# Patient Record
Sex: Female | Born: 1970 | Race: Black or African American | Hispanic: No | Marital: Married | Smoking: Never smoker
Health system: Southern US, Community
[De-identification: ages and names within clinical notes are randomized; demographics above are authoritative.]

## PROBLEM LIST (undated history)

## (undated) ENCOUNTER — Emergency Department (HOSPITAL_COMMUNITY): Payer: BC Managed Care – PPO

## (undated) DIAGNOSIS — I471 Supraventricular tachycardia, unspecified: Secondary | ICD-10-CM

## (undated) DIAGNOSIS — F4310 Post-traumatic stress disorder, unspecified: Secondary | ICD-10-CM

## (undated) DIAGNOSIS — D649 Anemia, unspecified: Secondary | ICD-10-CM

## (undated) DIAGNOSIS — E0590 Thyrotoxicosis, unspecified without thyrotoxic crisis or storm: Secondary | ICD-10-CM

## (undated) DIAGNOSIS — R9431 Abnormal electrocardiogram [ECG] [EKG]: Secondary | ICD-10-CM

## (undated) DIAGNOSIS — H209 Unspecified iridocyclitis: Secondary | ICD-10-CM

## (undated) DIAGNOSIS — E559 Vitamin D deficiency, unspecified: Secondary | ICD-10-CM

## (undated) DIAGNOSIS — D219 Benign neoplasm of connective and other soft tissue, unspecified: Secondary | ICD-10-CM

## (undated) DIAGNOSIS — R51 Headache: Secondary | ICD-10-CM

## (undated) DIAGNOSIS — I4719 Other supraventricular tachycardia: Secondary | ICD-10-CM

## (undated) DIAGNOSIS — E538 Deficiency of other specified B group vitamins: Secondary | ICD-10-CM

## (undated) DIAGNOSIS — D573 Sickle-cell trait: Secondary | ICD-10-CM

## (undated) DIAGNOSIS — R0602 Shortness of breath: Secondary | ICD-10-CM

## (undated) DIAGNOSIS — I10 Essential (primary) hypertension: Secondary | ICD-10-CM

## (undated) DIAGNOSIS — F419 Anxiety disorder, unspecified: Secondary | ICD-10-CM

## (undated) DIAGNOSIS — M7989 Other specified soft tissue disorders: Secondary | ICD-10-CM

## (undated) HISTORY — DX: Anemia, unspecified: D64.9

## (undated) HISTORY — DX: Deficiency of other specified B group vitamins: E53.8

## (undated) HISTORY — DX: Other specified soft tissue disorders: M79.89

## (undated) HISTORY — DX: Vitamin D deficiency, unspecified: E55.9

## (undated) HISTORY — DX: Benign neoplasm of connective and other soft tissue, unspecified: D21.9

## (undated) HISTORY — DX: Other supraventricular tachycardia: I47.19

## (undated) HISTORY — DX: Supraventricular tachycardia: I47.1

## (undated) HISTORY — DX: Abnormal electrocardiogram (ECG) (EKG): R94.31

## (undated) HISTORY — PX: OTHER SURGICAL HISTORY: SHX169

## (undated) HISTORY — PX: KNEE ARTHROSCOPY W/ INTERNAL FIXATION TIBIAL SPINE FRACTURE: SHX1871

---

## 1997-11-19 ENCOUNTER — Other Ambulatory Visit: Admission: RE | Admit: 1997-11-19 | Discharge: 1997-11-19 | Payer: Self-pay | Admitting: Obstetrics and Gynecology

## 1997-11-27 ENCOUNTER — Other Ambulatory Visit: Admission: RE | Admit: 1997-11-27 | Discharge: 1997-11-27 | Payer: Self-pay | Admitting: Obstetrics and Gynecology

## 1997-12-05 ENCOUNTER — Other Ambulatory Visit: Admission: RE | Admit: 1997-12-05 | Discharge: 1997-12-05 | Payer: Self-pay | Admitting: Obstetrics and Gynecology

## 1998-02-18 ENCOUNTER — Inpatient Hospital Stay (HOSPITAL_COMMUNITY): Admission: AD | Admit: 1998-02-18 | Discharge: 1998-02-18 | Payer: Self-pay | Admitting: Obstetrics and Gynecology

## 1998-05-24 ENCOUNTER — Inpatient Hospital Stay (HOSPITAL_COMMUNITY): Admission: AD | Admit: 1998-05-24 | Discharge: 1998-05-24 | Payer: Self-pay | Admitting: Obstetrics and Gynecology

## 1998-06-30 ENCOUNTER — Inpatient Hospital Stay (HOSPITAL_COMMUNITY): Admission: AD | Admit: 1998-06-30 | Discharge: 1998-06-30 | Payer: Self-pay | Admitting: Obstetrics and Gynecology

## 1998-07-01 ENCOUNTER — Inpatient Hospital Stay (HOSPITAL_COMMUNITY): Admission: AD | Admit: 1998-07-01 | Discharge: 1998-07-03 | Payer: Self-pay | Admitting: Obstetrics and Gynecology

## 1998-08-26 ENCOUNTER — Other Ambulatory Visit: Admission: RE | Admit: 1998-08-26 | Discharge: 1998-08-26 | Payer: Self-pay | Admitting: Obstetrics and Gynecology

## 1999-12-06 ENCOUNTER — Inpatient Hospital Stay (HOSPITAL_COMMUNITY): Admission: AD | Admit: 1999-12-06 | Discharge: 1999-12-06 | Payer: Self-pay | Admitting: *Deleted

## 1999-12-07 ENCOUNTER — Inpatient Hospital Stay (HOSPITAL_COMMUNITY): Admission: AD | Admit: 1999-12-07 | Discharge: 1999-12-09 | Payer: Self-pay | Admitting: *Deleted

## 2009-04-22 ENCOUNTER — Ambulatory Visit: Payer: Self-pay | Admitting: Diagnostic Radiology

## 2009-04-22 ENCOUNTER — Encounter: Payer: Self-pay | Admitting: Emergency Medicine

## 2009-04-23 ENCOUNTER — Inpatient Hospital Stay (HOSPITAL_COMMUNITY): Admission: EM | Admit: 2009-04-23 | Discharge: 2009-04-24 | Payer: Self-pay | Admitting: Emergency Medicine

## 2009-04-23 ENCOUNTER — Encounter (INDEPENDENT_AMBULATORY_CARE_PROVIDER_SITE_OTHER): Payer: Self-pay | Admitting: Internal Medicine

## 2009-04-24 ENCOUNTER — Encounter (INDEPENDENT_AMBULATORY_CARE_PROVIDER_SITE_OTHER): Payer: Self-pay | Admitting: Internal Medicine

## 2009-05-09 ENCOUNTER — Ambulatory Visit: Payer: Self-pay | Admitting: Cardiology

## 2009-05-09 ENCOUNTER — Observation Stay (HOSPITAL_COMMUNITY): Admission: EM | Admit: 2009-05-09 | Discharge: 2009-05-09 | Payer: Self-pay | Admitting: Emergency Medicine

## 2009-05-09 ENCOUNTER — Encounter: Payer: Self-pay | Admitting: Cardiology

## 2009-06-04 ENCOUNTER — Ambulatory Visit: Payer: Self-pay | Admitting: Internal Medicine

## 2009-06-04 ENCOUNTER — Inpatient Hospital Stay (HOSPITAL_COMMUNITY): Admission: EM | Admit: 2009-06-04 | Discharge: 2009-06-06 | Payer: Self-pay | Admitting: Emergency Medicine

## 2009-06-06 ENCOUNTER — Ambulatory Visit: Payer: Self-pay | Admitting: Surgery

## 2009-06-06 ENCOUNTER — Encounter (INDEPENDENT_AMBULATORY_CARE_PROVIDER_SITE_OTHER): Payer: Self-pay | Admitting: Family Medicine

## 2009-06-15 ENCOUNTER — Observation Stay (HOSPITAL_COMMUNITY): Admission: EM | Admit: 2009-06-15 | Discharge: 2009-06-16 | Payer: Self-pay | Admitting: Emergency Medicine

## 2009-06-19 ENCOUNTER — Emergency Department (HOSPITAL_BASED_OUTPATIENT_CLINIC_OR_DEPARTMENT_OTHER): Admission: EM | Admit: 2009-06-19 | Discharge: 2009-06-19 | Payer: Self-pay | Admitting: Emergency Medicine

## 2009-06-19 ENCOUNTER — Ambulatory Visit: Payer: Self-pay | Admitting: Diagnostic Radiology

## 2009-06-20 ENCOUNTER — Emergency Department (HOSPITAL_BASED_OUTPATIENT_CLINIC_OR_DEPARTMENT_OTHER): Admission: EM | Admit: 2009-06-20 | Discharge: 2009-06-21 | Payer: Self-pay | Admitting: Emergency Medicine

## 2009-09-17 ENCOUNTER — Ambulatory Visit: Payer: Self-pay | Admitting: Diagnostic Radiology

## 2009-09-17 ENCOUNTER — Emergency Department (HOSPITAL_BASED_OUTPATIENT_CLINIC_OR_DEPARTMENT_OTHER): Admission: EM | Admit: 2009-09-17 | Discharge: 2009-09-17 | Payer: Self-pay | Admitting: Emergency Medicine

## 2010-04-22 ENCOUNTER — Emergency Department (HOSPITAL_COMMUNITY): Admission: EM | Admit: 2010-04-22 | Discharge: 2010-04-22 | Payer: Self-pay | Admitting: Emergency Medicine

## 2010-04-25 ENCOUNTER — Inpatient Hospital Stay (HOSPITAL_COMMUNITY)
Admission: RE | Admit: 2010-04-25 | Discharge: 2010-04-28 | Payer: Self-pay | Source: Home / Self Care | Admitting: Orthopaedic Surgery

## 2010-05-02 ENCOUNTER — Ambulatory Visit
Admission: RE | Admit: 2010-05-02 | Discharge: 2010-05-02 | Payer: Self-pay | Source: Home / Self Care | Admitting: Orthopaedic Surgery

## 2010-05-02 ENCOUNTER — Ambulatory Visit: Payer: Self-pay | Admitting: Vascular Surgery

## 2010-05-02 ENCOUNTER — Encounter (INDEPENDENT_AMBULATORY_CARE_PROVIDER_SITE_OTHER): Payer: Self-pay | Admitting: Orthopaedic Surgery

## 2010-06-03 ENCOUNTER — Encounter
Admission: RE | Admit: 2010-06-03 | Discharge: 2010-08-07 | Payer: Self-pay | Source: Home / Self Care | Attending: Orthopaedic Surgery | Admitting: Orthopaedic Surgery

## 2010-08-18 ENCOUNTER — Encounter
Admission: RE | Admit: 2010-08-18 | Discharge: 2010-09-16 | Payer: Self-pay | Source: Home / Self Care | Attending: Orthopaedic Surgery | Admitting: Orthopaedic Surgery

## 2010-09-23 ENCOUNTER — Ambulatory Visit: Payer: PRIVATE HEALTH INSURANCE | Attending: Orthopaedic Surgery | Admitting: Physical Therapy

## 2010-09-23 DIAGNOSIS — M25569 Pain in unspecified knee: Secondary | ICD-10-CM | POA: Insufficient documentation

## 2010-09-23 DIAGNOSIS — R262 Difficulty in walking, not elsewhere classified: Secondary | ICD-10-CM | POA: Insufficient documentation

## 2010-09-23 DIAGNOSIS — M25669 Stiffness of unspecified knee, not elsewhere classified: Secondary | ICD-10-CM | POA: Insufficient documentation

## 2010-09-23 DIAGNOSIS — IMO0001 Reserved for inherently not codable concepts without codable children: Secondary | ICD-10-CM | POA: Insufficient documentation

## 2010-09-24 ENCOUNTER — Encounter: Payer: Self-pay | Admitting: Physical Therapy

## 2010-10-02 ENCOUNTER — Ambulatory Visit: Payer: PRIVATE HEALTH INSURANCE | Admitting: Physical Therapy

## 2010-10-09 ENCOUNTER — Ambulatory Visit: Payer: PRIVATE HEALTH INSURANCE | Admitting: Physical Therapy

## 2010-10-19 ENCOUNTER — Emergency Department (HOSPITAL_BASED_OUTPATIENT_CLINIC_OR_DEPARTMENT_OTHER)
Admission: EM | Admit: 2010-10-19 | Discharge: 2010-10-19 | Disposition: A | Discharge status: Home / Self Care | Payer: BC Managed Care – PPO | Attending: Emergency Medicine | Admitting: Emergency Medicine

## 2010-10-19 ENCOUNTER — Emergency Department (INDEPENDENT_AMBULATORY_CARE_PROVIDER_SITE_OTHER): Payer: BC Managed Care – PPO

## 2010-10-19 DIAGNOSIS — R42 Dizziness and giddiness: Secondary | ICD-10-CM

## 2010-10-19 DIAGNOSIS — F411 Generalized anxiety disorder: Secondary | ICD-10-CM | POA: Insufficient documentation

## 2010-10-19 DIAGNOSIS — I10 Essential (primary) hypertension: Secondary | ICD-10-CM | POA: Insufficient documentation

## 2010-10-19 DIAGNOSIS — R51 Headache: Secondary | ICD-10-CM | POA: Insufficient documentation

## 2010-10-19 DIAGNOSIS — R209 Unspecified disturbances of skin sensation: Secondary | ICD-10-CM | POA: Insufficient documentation

## 2010-10-19 LAB — CBC
HCT: 37.2 % (ref 36.0–46.0)
Hemoglobin: 13.1 g/dL (ref 12.0–15.0)
MCH: 23.9 pg — ABNORMAL LOW (ref 26.0–34.0)
RBC: 5.49 MIL/uL — ABNORMAL HIGH (ref 3.87–5.11)
RDW: 14.2 % (ref 11.5–15.5)
WBC: 3.8 10*3/uL — ABNORMAL LOW (ref 4.0–10.5)

## 2010-10-19 LAB — URINE MICROSCOPIC-ADD ON

## 2010-10-19 LAB — PROTIME-INR
INR: 1.01 (ref 0.00–1.49)
Prothrombin Time: 13.5 seconds (ref 11.6–15.2)

## 2010-10-19 LAB — BASIC METABOLIC PANEL
Calcium: 9.1 mg/dL (ref 8.4–10.5)
Chloride: 107 mEq/L (ref 96–112)
Creatinine, Ser: 0.7 mg/dL (ref 0.4–1.2)
GFR calc Af Amer: >60 mL/min (ref >60)
GFR calc non Af Amer: >60 mL/min (ref >60)
Glucose, Bld: 89 mg/dL (ref 70–99)

## 2010-10-19 LAB — DIFFERENTIAL
Lymphocytes Relative: 27 % (ref 12–46)
Monocytes Relative: 9 % (ref 3–12)
Neutrophils Relative %: 63 % (ref 43–77)

## 2010-10-19 LAB — URINALYSIS, ROUTINE W REFLEX MICROSCOPIC
Bilirubin Urine: NEGATIVE
Leukocytes, UA: NEGATIVE
Protein, ur: NEGATIVE mg/dL
Specific Gravity, Urine: 1.012 (ref 1.005–1.030)
pH: 6.5 (ref 5.0–8.0)

## 2010-10-29 ENCOUNTER — Other Ambulatory Visit: Payer: Self-pay | Admitting: Family Medicine

## 2010-10-29 DIAGNOSIS — Z1231 Encounter for screening mammogram for malignant neoplasm of breast: Secondary | ICD-10-CM

## 2010-10-30 LAB — COMPREHENSIVE METABOLIC PANEL
ALT: 14 U/L (ref 0–35)
AST: 25 U/L (ref 0–37)
Albumin: 3.8 g/dL (ref 3.5–5.2)
Alkaline Phosphatase: 42 U/L (ref 39–117)
Calcium: 8.5 mg/dL (ref 8.4–10.5)
GFR calc Af Amer: >60 mL/min (ref >60)
Potassium: 3.8 mEq/L (ref 3.5–5.1)
Sodium: 136 mEq/L (ref 135–145)
Total Protein: 7 g/dL (ref 6.0–8.3)

## 2010-10-30 LAB — PREGNANCY, URINE: Preg Test, Ur: NEGATIVE

## 2010-10-30 LAB — MRSA CULTURE

## 2010-11-05 LAB — URINE MICROSCOPIC-ADD ON

## 2010-11-05 LAB — BASIC METABOLIC PANEL
BUN: 13 mg/dL (ref 6–23)
CO2: 27 mEq/L (ref 19–32)
Calcium: 8.6 mg/dL (ref 8.4–10.5)
Chloride: 104 mEq/L (ref 96–112)
Creatinine, Ser: 0.7 mg/dL (ref 0.4–1.2)
Glucose, Bld: 118 mg/dL — ABNORMAL HIGH (ref 70–99)

## 2010-11-05 LAB — DIFFERENTIAL
Basophils Absolute: 0.2 10*3/uL — ABNORMAL HIGH (ref 0.0–0.1)
Basophils Relative: 4 % — ABNORMAL HIGH (ref 0–1)
Eosinophils Absolute: 0 10*3/uL (ref 0.0–0.7)
Monocytes Relative: 8 % (ref 3–12)
Neutrophils Relative %: 54 % (ref 43–77)

## 2010-11-05 LAB — URINALYSIS, ROUTINE W REFLEX MICROSCOPIC
Glucose, UA: NEGATIVE mg/dL
Ketones, ur: NEGATIVE mg/dL
Protein, ur: NEGATIVE mg/dL
Urobilinogen, UA: 0.2 mg/dL (ref 0.0–1.0)

## 2010-11-05 LAB — CBC
MCHC: 32.2 g/dL (ref 30.0–36.0)
MCV: 75.4 fL — ABNORMAL LOW (ref 78.0–100.0)
Platelets: 205 10*3/uL (ref 150–400)
RBC: 5.3 MIL/uL — ABNORMAL HIGH (ref 3.87–5.11)
RDW: 13.8 % (ref 11.5–15.5)

## 2010-11-19 LAB — CBC
HCT: 43.6 % (ref 36.0–46.0)
MCV: 73.8 fL — ABNORMAL LOW (ref 78.0–100.0)
RBC: 5.91 MIL/uL — ABNORMAL HIGH (ref 3.87–5.11)
WBC: 8.4 10*3/uL (ref 4.0–10.5)

## 2010-11-19 LAB — DIFFERENTIAL
Eosinophils Absolute: 0 10*3/uL (ref 0.0–0.7)
Eosinophils Relative: 0 % (ref 0–5)
Lymphs Abs: 2.9 10*3/uL (ref 0.7–4.0)
Monocytes Absolute: 0.5 10*3/uL (ref 0.1–1.0)
Monocytes Relative: 6 % (ref 3–12)

## 2010-11-19 LAB — BASIC METABOLIC PANEL
CO2: 23 mEq/L (ref 19–32)
Calcium: 9.3 mg/dL (ref 8.4–10.5)
Chloride: 100 mEq/L (ref 96–112)
Chloride: 103 mEq/L (ref 96–112)
GFR calc Af Amer: >60 mL/min (ref >60)
GFR calc Af Amer: >60 mL/min (ref >60)
Potassium: 3 mEq/L — ABNORMAL LOW (ref 3.5–5.1)
Potassium: 3.7 mEq/L (ref 3.5–5.1)
Sodium: 138 mEq/L (ref 135–145)

## 2010-11-20 LAB — RAPID URINE DRUG SCREEN, HOSP PERFORMED
Amphetamines: NOT DETECTED
Barbiturates: NOT DETECTED
Benzodiazepines: NOT DETECTED
Cocaine: NOT DETECTED
Opiates: NOT DETECTED
Tetrahydrocannabinol: NOT DETECTED

## 2010-11-20 LAB — POCT I-STAT, CHEM 8
BUN: 10 mg/dL (ref 6–23)
BUN: 14 mg/dL (ref 6–23)
Calcium, Ion: 1.07 mmol/L — ABNORMAL LOW (ref 1.12–1.32)
Calcium, Ion: 1.17 mmol/L (ref 1.12–1.32)
Chloride: 104 meq/L (ref 96–112)
Chloride: 104 meq/L (ref 96–112)
Creatinine, Ser: 0.7 mg/dL (ref 0.4–1.2)
Creatinine, Ser: 0.9 mg/dL (ref 0.4–1.2)
Glucose, Bld: 157 mg/dL — ABNORMAL HIGH (ref 70–99)
Glucose, Bld: 165 mg/dL — ABNORMAL HIGH (ref 70–99)
HCT: 40 % (ref 36.0–46.0)
HCT: 42 % (ref 36.0–46.0)
Hemoglobin: 13.6 g/dL (ref 12.0–15.0)
Hemoglobin: 14.3 g/dL (ref 12.0–15.0)
Potassium: 3.4 meq/L — ABNORMAL LOW (ref 3.5–5.1)
Potassium: 3.5 meq/L (ref 3.5–5.1)
Sodium: 139 mEq/L (ref 135–145)
Sodium: 142 meq/L (ref 135–145)
TCO2: 22 mmol/L (ref 0–100)
TCO2: 23 mmol/L (ref 0–100)

## 2010-11-20 LAB — BASIC METABOLIC PANEL
BUN: 13 mg/dL (ref 6–23)
BUN: 4 mg/dL — ABNORMAL LOW (ref 6–23)
BUN: 6 mg/dL (ref 6–23)
CO2: 22 mEq/L (ref 19–32)
Chloride: 102 mEq/L (ref 96–112)
Chloride: 105 mEq/L (ref 96–112)
Creatinine, Ser: 0.87 mg/dL (ref 0.4–1.2)
GFR calc non Af Amer: >60 mL/min (ref >60)
Glucose, Bld: 154 mg/dL — ABNORMAL HIGH (ref 70–99)
Glucose, Bld: 95 mg/dL (ref 70–99)
Potassium: 3.4 mEq/L — ABNORMAL LOW (ref 3.5–5.1)
Potassium: 3.5 mEq/L (ref 3.5–5.1)

## 2010-11-20 LAB — DIFFERENTIAL
Basophils Absolute: 0 10*3/uL (ref 0.0–0.1)
Basophils Relative: 0 % (ref 0–1)
Monocytes Relative: 5 % (ref 3–12)
Neutro Abs: 4.6 10*3/uL (ref 1.7–7.7)
Neutrophils Relative %: 78 % — ABNORMAL HIGH (ref 43–77)

## 2010-11-20 LAB — METANEPHRINES, PLASMA
Metanephrine, Free: 46 pg/mL (ref <=57)
Normetanephrine, Free: 82 pg/mL (ref <=148)
Total Metanephrines-Plasma: 128 pg/mL (ref <=205)

## 2010-11-20 LAB — TSH
TSH: 2.22 u[IU]/mL (ref 0.350–4.500)
TSH: 2.349 u[IU]/mL (ref 0.350–4.500)

## 2010-11-20 LAB — CBC
HCT: 36.7 % (ref 36.0–46.0)
MCHC: 33.2 g/dL (ref 30.0–36.0)
Platelets: 213 10*3/uL (ref 150–400)
RBC: 5.27 MIL/uL — ABNORMAL HIGH (ref 3.87–5.11)
RDW: 14.6 % (ref 11.5–15.5)
WBC: 4.7 10*3/uL (ref 4.0–10.5)

## 2010-11-20 LAB — URINALYSIS, ROUTINE W REFLEX MICROSCOPIC
Glucose, UA: NEGATIVE mg/dL
Specific Gravity, Urine: 1.013 (ref 1.005–1.030)
Urobilinogen, UA: 0.2 mg/dL (ref 0.0–1.0)

## 2010-11-20 LAB — POCT CARDIAC MARKERS
CKMB, poc: 1 ng/mL (ref 1.0–8.0)
CKMB, poc: <1 ng/mL — ABNORMAL LOW (ref 1.0–8.0)
CKMB, poc: <1 ng/mL — ABNORMAL LOW (ref 1.0–8.0)
Myoglobin, poc: 50.7 ng/mL (ref 12–200)
Myoglobin, poc: 82.2 ng/mL (ref 12–200)
Myoglobin, poc: 84.7 ng/mL (ref 12–200)
Troponin i, poc: <0.05 ng/mL (ref 0.00–0.09)
Troponin i, poc: <0.05 ng/mL (ref 0.00–0.09)
Troponin i, poc: <0.05 ng/mL (ref 0.00–0.09)

## 2010-11-20 LAB — D-DIMER, QUANTITATIVE: D-Dimer, Quant: 0.37 ug/mL-FEU (ref 0.00–0.48)

## 2010-11-20 LAB — URINE MICROSCOPIC-ADD ON

## 2010-11-20 LAB — LIPID PANEL
LDL Cholesterol: 88 mg/dL (ref 0–99)
Total CHOL/HDL Ratio: 4 RATIO
Triglycerides: 59 mg/dL (ref <150)
VLDL: 12 mg/dL (ref 0–40)

## 2010-11-20 LAB — BASIC METABOLIC PANEL WITH GFR
Calcium: 9.1 mg/dL (ref 8.4–10.5)
GFR calc Af Amer: >60 mL/min (ref >60)
GFR calc non Af Amer: >60 mL/min (ref >60)
Potassium: 3.3 meq/L — ABNORMAL LOW (ref 3.5–5.1)
Sodium: 135 meq/L (ref 135–145)

## 2010-11-20 LAB — PROTIME-INR
INR: 0.99 (ref 0.00–1.49)
Prothrombin Time: 13 seconds (ref 11.6–15.2)

## 2010-11-20 LAB — CATECHOLAMINES, FRACTIONATED, PLASMA
Dopamine: 36 pg/mL — ABNORMAL HIGH (ref <60)
Epinephrine: 101 pg/mL — ABNORMAL HIGH (ref <84)
Norepinephrine: 380 pg/mL (ref <420)
Total Catecholamines (Nor+Epi): 481 pg/mL (ref <504)

## 2010-11-20 LAB — T4, FREE: Free T4: 1.02 ng/dL (ref 0.80–1.80)

## 2010-11-20 LAB — MAGNESIUM: Magnesium: 2.2 mg/dL (ref 1.5–2.5)

## 2010-11-21 LAB — COMPREHENSIVE METABOLIC PANEL
AST: 20 U/L (ref 0–37)
Albumin: 4 g/dL (ref 3.5–5.2)
Alkaline Phosphatase: 65 U/L (ref 39–117)
BUN: 11 mg/dL (ref 6–23)
Chloride: 104 mEq/L (ref 96–112)
GFR calc Af Amer: >60 mL/min (ref >60)
Potassium: 3.8 mEq/L (ref 3.5–5.1)
Total Bilirubin: 0.6 mg/dL (ref 0.3–1.2)
Total Protein: 7 g/dL (ref 6.0–8.3)

## 2010-11-21 LAB — POCT CARDIAC MARKERS
CKMB, poc: <1 ng/mL — ABNORMAL LOW (ref 1.0–8.0)
CKMB, poc: 2.1 ng/mL (ref 1.0–8.0)
Myoglobin, poc: 87.3 ng/mL (ref 12–200)
Myoglobin, poc: 125 ng/mL (ref 12–200)
Myoglobin, poc: 94.7 ng/mL (ref 12–200)
Troponin i, poc: <0.05 ng/mL (ref 0.00–0.09)
Troponin i, poc: <0.05 ng/mL (ref 0.00–0.09)
Troponin i, poc: <0.05 ng/mL (ref 0.00–0.09)
Troponin i, poc: <0.05 ng/mL (ref 0.00–0.09)

## 2010-11-21 LAB — BASIC METABOLIC PANEL
BUN: 13 mg/dL (ref 6–23)
BUN: 11 mg/dL (ref 6–23)
BUN: 13 mg/dL (ref 6–23)
Calcium: 9.4 mg/dL (ref 8.4–10.5)
Chloride: 106 mEq/L (ref 96–112)
Chloride: 105 mEq/L (ref 96–112)
Chloride: 105 mEq/L (ref 96–112)
Creatinine, Ser: 0.8 mg/dL (ref 0.4–1.2)
Creatinine, Ser: 0.91 mg/dL (ref 0.4–1.2)
GFR calc Af Amer: >60 mL/min (ref >60)
GFR calc Af Amer: >60 mL/min (ref >60)
GFR calc non Af Amer: >60 mL/min (ref >60)
Glucose, Bld: 93 mg/dL (ref 70–99)
Potassium: 3.7 mEq/L (ref 3.5–5.1)
Sodium: 138 mEq/L (ref 135–145)

## 2010-11-21 LAB — CBC
HCT: 41.3 % (ref 36.0–46.0)
HCT: 40.8 % (ref 36.0–46.0)
HCT: 40.9 % (ref 36.0–46.0)
Hemoglobin: 13.4 g/dL (ref 12.0–15.0)
MCHC: 31.9 g/dL (ref 30.0–36.0)
MCV: 74.6 fL — ABNORMAL LOW (ref 78.0–100.0)
MCV: 73.9 fL — ABNORMAL LOW (ref 78.0–100.0)
MCV: 74.6 fL — ABNORMAL LOW (ref 78.0–100.0)
Platelets: 203 10*3/uL (ref 150–400)
Platelets: 219 10*3/uL (ref 150–400)
Platelets: 232 10*3/uL (ref 150–400)
RBC: 5.54 MIL/uL — ABNORMAL HIGH (ref 3.87–5.11)
RDW: 14.5 % (ref 11.5–15.5)
RDW: 15.6 % — ABNORMAL HIGH (ref 11.5–15.5)
WBC: 4.4 10*3/uL (ref 4.0–10.5)
WBC: 4 10*3/uL (ref 4.0–10.5)
WBC: 5.7 10*3/uL (ref 4.0–10.5)
WBC: 6.3 10*3/uL (ref 4.0–10.5)

## 2010-11-21 LAB — URINALYSIS, ROUTINE W REFLEX MICROSCOPIC
Ketones, ur: NEGATIVE mg/dL
Leukocytes, UA: NEGATIVE
Nitrite: NEGATIVE
Specific Gravity, Urine: 1.019 (ref 1.005–1.030)
Urobilinogen, UA: 1 mg/dL (ref 0.0–1.0)
pH: 6.5 (ref 5.0–8.0)

## 2010-11-21 LAB — LIPID PANEL
HDL: 31 mg/dL — ABNORMAL LOW (ref >39)
Total CHOL/HDL Ratio: 4.9 RATIO
Triglycerides: 125 mg/dL (ref <150)
VLDL: 25 mg/dL (ref 0–40)
VLDL: 27 mg/dL (ref 0–40)

## 2010-11-21 LAB — URINE MICROSCOPIC-ADD ON

## 2010-11-21 LAB — DIFFERENTIAL
Basophils Absolute: 0.1 10*3/uL (ref 0.0–0.1)
Basophils Relative: 1 % (ref 0–1)
Eosinophils Absolute: 0 10*3/uL (ref 0.0–0.7)
Eosinophils Absolute: 0 10*3/uL (ref 0.0–0.7)
Eosinophils Relative: 0 % (ref 0–5)
Eosinophils Relative: 1 % (ref 0–5)
Lymphocytes Relative: 26 % (ref 12–46)
Lymphocytes Relative: 26 % (ref 12–46)
Lymphs Abs: 1.2 10*3/uL (ref 0.7–4.0)
Lymphs Abs: 1 10*3/uL (ref 0.7–4.0)
Lymphs Abs: 1.5 10*3/uL (ref 0.7–4.0)
Monocytes Absolute: 0.3 10*3/uL (ref 0.1–1.0)
Monocytes Relative: 8 % (ref 3–12)
Neutro Abs: 2.8 10*3/uL (ref 1.7–7.7)
Neutrophils Relative %: 64 % (ref 43–77)
Neutrophils Relative %: 65 % (ref 43–77)

## 2010-11-21 LAB — T4: T4, Total: 7.5 ug/dL (ref 5.0–12.5)

## 2010-11-21 LAB — CARDIAC PANEL(CRET KIN+CKTOT+MB+TROPI)
CK, MB: 1.7 ng/mL (ref 0.3–4.0)
Relative Index: 0.5 (ref 0.0–2.5)
Total CK: 320 U/L — ABNORMAL HIGH (ref 7–177)
Troponin I: 0.01 ng/mL (ref 0.00–0.06)

## 2010-11-21 LAB — TSH: TSH: 2.471 u[IU]/mL (ref 0.350–4.500)

## 2010-11-26 ENCOUNTER — Ambulatory Visit
Admission: RE | Admit: 2010-11-26 | Discharge: 2010-11-26 | Disposition: A | Discharge status: Home / Self Care | Payer: BC Managed Care – PPO | Source: Ambulatory Visit | Attending: Family Medicine | Admitting: Family Medicine

## 2010-11-26 DIAGNOSIS — Z1231 Encounter for screening mammogram for malignant neoplasm of breast: Secondary | ICD-10-CM

## 2011-11-13 ENCOUNTER — Other Ambulatory Visit: Payer: Self-pay

## 2011-11-13 ENCOUNTER — Emergency Department (HOSPITAL_BASED_OUTPATIENT_CLINIC_OR_DEPARTMENT_OTHER)
Admission: EM | Admit: 2011-11-13 | Discharge: 2011-11-13 | Disposition: A | Discharge status: Home / Self Care | Payer: BC Managed Care – PPO | Attending: Emergency Medicine | Admitting: Emergency Medicine

## 2011-11-13 ENCOUNTER — Encounter (HOSPITAL_BASED_OUTPATIENT_CLINIC_OR_DEPARTMENT_OTHER): Payer: Self-pay | Admitting: *Deleted

## 2011-11-13 DIAGNOSIS — K117 Disturbances of salivary secretion: Secondary | ICD-10-CM | POA: Insufficient documentation

## 2011-11-13 DIAGNOSIS — E876 Hypokalemia: Secondary | ICD-10-CM | POA: Insufficient documentation

## 2011-11-13 DIAGNOSIS — I10 Essential (primary) hypertension: Secondary | ICD-10-CM | POA: Insufficient documentation

## 2011-11-13 DIAGNOSIS — R209 Unspecified disturbances of skin sensation: Secondary | ICD-10-CM | POA: Insufficient documentation

## 2011-11-13 DIAGNOSIS — R079 Chest pain, unspecified: Secondary | ICD-10-CM | POA: Insufficient documentation

## 2011-11-13 DIAGNOSIS — R51 Headache: Secondary | ICD-10-CM | POA: Insufficient documentation

## 2011-11-13 DIAGNOSIS — R202 Paresthesia of skin: Secondary | ICD-10-CM

## 2011-11-13 DIAGNOSIS — R000 Tachycardia, unspecified: Secondary | ICD-10-CM | POA: Insufficient documentation

## 2011-11-13 DIAGNOSIS — Z79899 Other long term (current) drug therapy: Secondary | ICD-10-CM | POA: Insufficient documentation

## 2011-11-13 HISTORY — DX: Essential (primary) hypertension: I10

## 2011-11-13 LAB — CBC
HCT: 37 % (ref 36.0–46.0)
Hemoglobin: 12.7 g/dL (ref 12.0–15.0)
MCH: 23 pg — ABNORMAL LOW (ref 26.0–34.0)
MCHC: 34.3 g/dL (ref 30.0–36.0)
RDW: 15.2 % (ref 11.5–15.5)

## 2011-11-13 LAB — TSH: TSH: 0.015 u[IU]/mL — ABNORMAL LOW (ref 0.350–4.500)

## 2011-11-13 LAB — COMPREHENSIVE METABOLIC PANEL
ALT: 16 U/L (ref 0–35)
AST: 21 U/L (ref 0–37)
Alkaline Phosphatase: 84 U/L (ref 39–117)
CO2: 24 mEq/L (ref 19–32)
Calcium: 9.5 mg/dL (ref 8.4–10.5)
GFR calc non Af Amer: >90 mL/min (ref >90)
Potassium: 3.1 mEq/L — ABNORMAL LOW (ref 3.5–5.1)
Sodium: 138 mEq/L (ref 135–145)
Total Protein: 7.5 g/dL (ref 6.0–8.3)

## 2011-11-13 MED ORDER — ASPIRIN EC 81 MG PO TBEC
244.0000 mg | DELAYED_RELEASE_TABLET | Freq: Once | ORAL | Status: DC
  Filled 2011-11-13: qty 3

## 2011-11-13 MED ORDER — POTASSIUM CHLORIDE CRYS ER 20 MEQ PO TBCR
40.0000 meq | EXTENDED_RELEASE_TABLET | Freq: Once | ORAL | Status: AC
  Administered 2011-11-13: 40 meq via ORAL
  Filled 2011-11-13: qty 2

## 2011-11-13 MED ORDER — POTASSIUM CHLORIDE CRYS ER 20 MEQ PO TBCR: 20.0000 meq | EXTENDED_RELEASE_TABLET | Freq: Two times a day (BID) | ORAL | Status: DC

## 2011-11-13 NOTE — Discharge Instructions (Signed)
Please take the potassium twice a day for the next week and followup with her doctor for a recheck of your blood tests. Return to the hospital immediately for severe or worsening limitations, chest pain, difficulty breathing.

## 2011-11-13 NOTE — ED Notes (Signed)
Pt states that she woke this am just PTA feeling as though her heart was racing states that she took a lorazepam asa and metoprolol  States that she has a a hx of panic attacks and sx are similar. Pt also with some SOB and HA pt also with a hx of tachycardia.

## 2011-11-13 NOTE — ED Provider Notes (Signed)
History     CSN: [id number]  Arrival date & time 11/13/11  0107   First MD Initiated Contact with Patient 11/13/11 [phone]      Chief Complaint  Patient presents with  . Chest Pain    (Consider location/radiation/quality/duration/timing/severity/associated sxs/prior treatment) HPI Comments: 41 year old female with a history of hypertension and supraventricular tachycardia intermittently presents with a complaint of palpitations. According to the patient she had awoken this evening with a mild headache, went to take her blood pressure and noticed that her heart started racing. She took an Ativan, aspirin, metoprolol and had worsening of her headache. At this point she noted some tingling in her left fingers which radiated up her arm, had a dry mouth. She notes that the same symptoms occurred approximately 2 years ago when she was seen by cardiologist and diagnosed with supraventricular tachycardia. She was also symptomatic approximately 2 weeks ago at work and was told by her family doctor when she went to the doctor that day to take another metoprolol and that her symptoms should improve. They have resolved spontaneously and currently the patient is having minimal headache, no palpitations and no symptoms in her arms.  Acute in onset Persistent Gradually improving No associated shortness of breath, fever, cough, chest pain, back pain, swelling in the legs.   Patient is a 41 y.o. female presenting with chest pain. The history is provided by the patient, the spouse and medical records.  Chest Pain     Past Medical History  Diagnosis Date  . Tachycardia   . Hypertension     Past Surgical History  Procedure Date  . Other surgical history tibia fx     History reviewed. No pertinent family history.  History  Substance Use Topics  . Smoking status: Never Smoker   . Smokeless tobacco: Not on file  . Alcohol Use: No    OB History    Grav Para Term Preterm Abortions TAB SAB Ect  Mult Living                  Review of Systems  Cardiovascular: Positive for chest pain.  All other systems reviewed and are negative.    Allergies  Review of patient's allergies indicates no known allergies.  Home Medications   Current Outpatient Rx  Name Route Sig Dispense Refill  . AMLODIPINE BESYLATE 10 MG PO TABS Oral Take 10 mg by mouth daily.    . ASPIRIN 81 MG PO TABS Oral Take 81 mg by mouth daily.    [name] CITALOPRAM HYDROBROMIDE 10 MG PO TABS Oral Take 10 mg by mouth daily.    [name] LORAZEPAM 0.5 MG PO TABS Oral Take 0.5 mg by mouth every 8 (eight) hours.    [name] METOPROLOL SUCCINATE ER 25 MG PO TB24 Oral Take 25 mg by mouth daily.    [name] POTASSIUM CHLORIDE CRYS ER 20 MEQ PO TBCR Oral Take 1 tablet (20 mEq total) by mouth 2 (two) times daily. 14 tablet 0    BP 164/84  Pulse 130  Temp 98.1 F (36.7 C)  Resp 20  SpO2 100%  LMP 11/13/2011  Physical Exam  Nursing note and vitals reviewed. Constitutional: She appears well-developed and well-nourished. No distress.  HENT:  Head: Normocephalic and atraumatic.  Mouth/Throat: Oropharynx is clear and moist. No oropharyngeal exudate.  Eyes: Conjunctivae and EOM are normal. Pupils are equal, round, and reactive to light. Right eye exhibits no discharge. Left eye exhibits no discharge. No scleral icterus.  Neck: Normal  range of motion. Neck supple. No JVD present. No thyromegaly present.       No thyromegaly  Cardiovascular: Regular rhythm, normal heart sounds and intact distal pulses.  Exam reveals no gallop and no friction rub.   No murmur heard.      Mild tachycardia  Pulmonary/Chest: Effort normal and breath sounds normal. No respiratory distress. She has no wheezes. She has no rales.  Abdominal: Soft. Bowel sounds are normal. She exhibits no distension and no mass. There is no tenderness.  Musculoskeletal: Normal range of motion. She exhibits no edema and no tenderness.  Lymphadenopathy:    She has no cervical adenopathy.    Neurological: She is alert. Coordination normal.  Skin: Skin is warm and dry. No rash noted. No erythema.  Psychiatric: She has a normal mood and affect. Her behavior is normal.    ED Course  Procedures (including critical care time)  ED ECG REPORT   Date: 11/13/2011   Rate: 112  Rhythm: sinus tachycardia  QRS Axis: normal  Intervals: normal  ST/T Wave abnormalities: nonspecific T wave changes  Conduction Disutrbances:none  Narrative Interpretation:   Old EKG Reviewed: Compared with EKG from 10/19/2010, rate faster otherwise no significant changes   Labs Reviewed  COMPREHENSIVE METABOLIC PANEL - Abnormal; Notable for the following:    Potassium 3.1 (*)    Glucose, Bld 143 (*)    All other components within normal limits  CBC - Abnormal; Notable for the following:    RBC 5.52 (*)    MCV 67.0 (*)    MCH 23.0 (*)    All other components within normal limits  CARDIAC PANEL(CRET KIN+CKTOT+MB+TROPI) - Abnormal; Notable for the following:    Total CK 446 (*)    All other components within normal limits  TSH   No results found.   1. Tachycardia   2. Paresthesias   3. Hypokalemia       MDM  Review of the medical records shows that the patient has had supraventricular tachycardia in the past however this has been several years ago and she has been on metoprolol for the last 3 years with good control. Currently her EKG shows a sinus tachycardia and a pulse of 112, there is some nonspecific T wave changes but this is normal sinus tachycardia. Compared to an EKG from March of last year the rate is faster but there is no significant changes. We'll check a TSH and a BMP to evaluate for hypokalemia. Patient has been informed that she will need followup or TSH with her family doctor. She denies weight loss, stimulant use, alcohol use, fevers, hallucinations or other complaints.   Patient has had low potassium, this was replaced in the emergency department, patient was encouraged to  followup with her family Dr. for a recheck of her potassium and have her family Dr. followup on the TSH sample that was drawn in the emergency department. I have given the patient some instructions on how to deal with palpitations at home including vagal maneuvers prior to using beta blocker. She will followup in the emergency department should her symptoms worsen. Understanding expressed by both patient and family members.  Vida Roller, MD 11/13/11 [phone]

## 2011-11-13 NOTE — ED Notes (Signed)
Pt reports feeling better. Denies chest pain and shob. Pt c/o headache.

## 2011-11-27 ENCOUNTER — Other Ambulatory Visit (HOSPITAL_COMMUNITY): Payer: Self-pay | Admitting: Endocrinology

## 2011-11-27 DIAGNOSIS — E0590 Thyrotoxicosis, unspecified without thyrotoxic crisis or storm: Secondary | ICD-10-CM

## 2011-12-02 ENCOUNTER — Emergency Department (HOSPITAL_COMMUNITY): Payer: BC Managed Care – PPO

## 2011-12-02 ENCOUNTER — Encounter (HOSPITAL_COMMUNITY): Payer: Self-pay | Admitting: *Deleted

## 2011-12-02 ENCOUNTER — Other Ambulatory Visit: Payer: Self-pay

## 2011-12-02 ENCOUNTER — Inpatient Hospital Stay (HOSPITAL_COMMUNITY)
Admission: EM | Admit: 2011-12-02 | Discharge: 2011-12-04 | DRG: 078 | Disposition: A | Discharge status: Home / Self Care | Payer: BC Managed Care – PPO | Attending: Internal Medicine | Admitting: Internal Medicine

## 2011-12-02 DIAGNOSIS — I498 Other specified cardiac arrhythmias: Secondary | ICD-10-CM | POA: Diagnosis present

## 2011-12-02 DIAGNOSIS — D573 Sickle-cell trait: Secondary | ICD-10-CM | POA: Diagnosis present

## 2011-12-02 DIAGNOSIS — E0590 Thyrotoxicosis, unspecified without thyrotoxic crisis or storm: Secondary | ICD-10-CM | POA: Diagnosis present

## 2011-12-02 DIAGNOSIS — E079 Disorder of thyroid, unspecified: Secondary | ICD-10-CM | POA: Diagnosis present

## 2011-12-02 DIAGNOSIS — I10 Essential (primary) hypertension: Secondary | ICD-10-CM | POA: Diagnosis present

## 2011-12-02 DIAGNOSIS — I471 Supraventricular tachycardia: Secondary | ICD-10-CM | POA: Diagnosis present

## 2011-12-02 DIAGNOSIS — G459 Transient cerebral ischemic attack, unspecified: Secondary | ICD-10-CM | POA: Diagnosis present

## 2011-12-02 DIAGNOSIS — I2699 Other pulmonary embolism without acute cor pulmonale: Principal | ICD-10-CM

## 2011-12-02 DIAGNOSIS — Z7982 Long term (current) use of aspirin: Secondary | ICD-10-CM

## 2011-12-02 HISTORY — DX: Sickle-cell trait: D57.3

## 2011-12-02 HISTORY — DX: Headache: R51

## 2011-12-02 HISTORY — DX: Anxiety disorder, unspecified: F41.9

## 2011-12-02 HISTORY — DX: Supraventricular tachycardia: I47.1

## 2011-12-02 HISTORY — DX: Unspecified iridocyclitis: H20.9

## 2011-12-02 HISTORY — DX: Thyrotoxicosis, unspecified without thyrotoxic crisis or storm: E05.90

## 2011-12-02 HISTORY — DX: Post-traumatic stress disorder, unspecified: F43.10

## 2011-12-02 HISTORY — DX: Supraventricular tachycardia, unspecified: I47.10

## 2011-12-02 LAB — POCT I-STAT TROPONIN I

## 2011-12-02 LAB — D-DIMER, QUANTITATIVE: D-Dimer, Quant: 0.9 ug/mL-FEU — ABNORMAL HIGH (ref 0.00–0.48)

## 2011-12-02 LAB — DIFFERENTIAL
Basophils Absolute: 0 10*3/uL (ref 0.0–0.1)
Eosinophils Absolute: 0 10*3/uL (ref 0.0–0.7)
Lymphs Abs: 0.8 10*3/uL (ref 0.7–4.0)
Monocytes Absolute: 0.5 10*3/uL (ref 0.1–1.0)
Monocytes Relative: 7 % (ref 3–12)
Neutro Abs: 5.3 10*3/uL (ref 1.7–7.7)

## 2011-12-02 LAB — COMPREHENSIVE METABOLIC PANEL
AST: 14 U/L (ref 0–37)
Albumin: 3.5 g/dL (ref 3.5–5.2)
BUN: 14 mg/dL (ref 6–23)
Calcium: 9.2 mg/dL (ref 8.4–10.5)
Chloride: 104 mEq/L (ref 96–112)
Creatinine, Ser: 0.64 mg/dL (ref 0.50–1.10)
Total Bilirubin: 0.3 mg/dL (ref 0.3–1.2)
Total Protein: 6.8 g/dL (ref 6.0–8.3)

## 2011-12-02 LAB — TSH: TSH: <0.008 u[IU]/mL — ABNORMAL LOW (ref 0.350–4.500)

## 2011-12-02 LAB — CBC
HCT: 35.9 % — ABNORMAL LOW (ref 36.0–46.0)
MCHC: 34.3 g/dL (ref 30.0–36.0)
MCV: 67.6 fL — ABNORMAL LOW (ref 78.0–100.0)
Platelets: 245 10*3/uL (ref 150–400)
RDW: 14.3 % (ref 11.5–15.5)
WBC: 6.6 10*3/uL (ref 4.0–10.5)

## 2011-12-02 LAB — T4, FREE: Free T4: 2.1 ng/dL — ABNORMAL HIGH (ref 0.80–1.80)

## 2011-12-02 MED ORDER — AMLODIPINE BESYLATE 10 MG PO TABS
10.0000 mg | ORAL_TABLET | Freq: Every day | ORAL | Status: DC
  Administered 2011-12-03 – 2011-12-04 (×2): 10 mg via ORAL
  Filled 2011-12-02 (×2): qty 1

## 2011-12-02 MED ORDER — ENOXAPARIN SODIUM 80 MG/0.8ML ~~LOC~~ SOLN
80.0000 mg | SUBCUTANEOUS | Status: AC
  Administered 2011-12-02: 80 mg via SUBCUTANEOUS
  Filled 2011-12-02: qty 0.8

## 2011-12-02 MED ORDER — SODIUM CHLORIDE 0.9 % IV BOLUS (SEPSIS)
1000.0000 mL | Freq: Once | INTRAVENOUS | Status: AC
  Administered 2011-12-02: 1000 mL via INTRAVENOUS

## 2011-12-02 MED ORDER — ASPIRIN 81 MG PO TABS: 81.0000 mg | ORAL_TABLET | Freq: Every day | ORAL | Status: DC

## 2011-12-02 MED ORDER — CITALOPRAM HYDROBROMIDE 10 MG PO TABS
10.0000 mg | ORAL_TABLET | Freq: Every day | ORAL | Status: DC
  Administered 2011-12-04: 10 mg via ORAL
  Filled 2011-12-02 (×2): qty 1

## 2011-12-02 MED ORDER — ASPIRIN EC 81 MG PO TBEC
81.0000 mg | DELAYED_RELEASE_TABLET | Freq: Every day | ORAL | Status: DC
  Administered 2011-12-03 – 2011-12-04 (×2): 81 mg via ORAL
  Filled 2011-12-02 (×2): qty 1

## 2011-12-02 MED ORDER — ENOXAPARIN SODIUM 80 MG/0.8ML ~~LOC~~ SOLN
80.0000 mg | Freq: Two times a day (BID) | SUBCUTANEOUS | Status: DC
  Administered 2011-12-03: 80 mg via SUBCUTANEOUS
  Filled 2011-12-02 (×3): qty 0.8

## 2011-12-02 MED ORDER — ACETAMINOPHEN 650 MG RE SUPP: 650.0000 mg | Freq: Four times a day (QID) | RECTAL | Status: DC | PRN

## 2011-12-02 MED ORDER — METOPROLOL SUCCINATE ER 25 MG PO TB24
25.0000 mg | ORAL_TABLET | Freq: Two times a day (BID) | ORAL | Status: DC
  Administered 2011-12-02 – 2011-12-04 (×4): 25 mg via ORAL
  Filled 2011-12-02 (×5): qty 1

## 2011-12-02 MED ORDER — LORAZEPAM 0.5 MG PO TABS
0.5000 mg | ORAL_TABLET | ORAL | Status: DC | PRN
  Administered 2011-12-02 – 2011-12-04 (×4): 0.5 mg via ORAL
  Filled 2011-12-02 (×5): qty 1

## 2011-12-02 MED ORDER — LORAZEPAM 2 MG/ML IJ SOLN
1.0000 mg | Freq: Once | INTRAMUSCULAR | Status: AC
  Administered 2011-12-02: 1 mg via INTRAVENOUS
  Filled 2011-12-02: qty 1

## 2011-12-02 MED ORDER — IOHEXOL 350 MG/ML SOLN
100.0000 mL | Freq: Once | INTRAVENOUS | Status: AC | PRN
  Administered 2011-12-02: 100 mL via INTRAVENOUS

## 2011-12-02 MED ORDER — ACETAMINOPHEN 325 MG PO TABS
650.0000 mg | ORAL_TABLET | Freq: Four times a day (QID) | ORAL | Status: DC | PRN
  Administered 2011-12-03 – 2011-12-04 (×2): 650 mg via ORAL
  Filled 2011-12-02: qty 2
  Filled 2011-12-02 (×2): qty 1

## 2011-12-02 MED ORDER — SODIUM CHLORIDE 0.9 % IJ SOLN
3.0000 mL | Freq: Two times a day (BID) | INTRAMUSCULAR | Status: DC
  Administered 2011-12-03 (×2): 3 mL via INTRAVENOUS

## 2011-12-02 NOTE — ED Provider Notes (Signed)
History     CSN: [id number]  Arrival date & time 12/02/11  1355   First MD Initiated Contact with Patient 12/02/11 1505      Chief Complaint  Patient presents with  . Chest Pain    (Consider location/radiation/quality/duration/timing/severity/associated sxs/prior treatment) HPI Pt with sudden onset chest tightness, palpiations, tremulousness, and mild SOB starting shortly after noon. Pt took pulse and was in 140's. She took her metoprolol at home with no resolution and was seen by urgent care. States MD was concerned about EKG and transferred to ED. Given ASA and nitro by ems with significant improvement of symptoms. Pt with known hyperthyroid to undergo nuclear test for thyroid. No leg swelling or pain. No prev blood clots.  Past Medical History  Diagnosis Date  . Tachycardia   . Hypertension   . Thyroid disease     Past Surgical History  Procedure Date  . Other surgical history tibia fx     No family history on file.  History  Substance Use Topics  . Smoking status: Never Smoker   . Smokeless tobacco: Not on file  . Alcohol Use: No    OB History    Grav Para Term Preterm Abortions TAB SAB Ect Mult Living                  Review of Systems  Constitutional: Negative for fever and chills.  HENT: Negative for neck pain and neck stiffness.   Respiratory: Positive for chest tightness and shortness of breath. Negative for cough and wheezing.   Cardiovascular: Positive for palpitations. Negative for chest pain and leg swelling.  Gastrointestinal: Negative for nausea, vomiting and abdominal pain.  Neurological: Negative for dizziness, weakness, light-headedness and numbness.    Allergies  Review of patient's allergies indicates no known allergies.  Home Medications   Current Outpatient Rx  Name Route Sig Dispense Refill  . AMLODIPINE BESYLATE 10 MG PO TABS Oral Take 10 mg by mouth daily.    . ASPIRIN 81 MG PO TABS Oral Take 81 mg by mouth daily.    [name] LORAZEPAM  0.5 MG PO TABS Oral Take 0.5 mg by mouth daily as needed. For anxiety.    [name] METOPROLOL SUCCINATE ER 25 MG PO TB24 Oral Take 25 mg by mouth 2 (two) times daily.       BP 121/77  Pulse 93  Temp(Src) 97 F (36.1 C) (Oral)  Resp 14  SpO2 99%  LMP 11/13/2011  Physical Exam  Nursing note and vitals reviewed. Constitutional: She is oriented to person, place, and time. She appears well-developed and well-nourished. No distress.  HENT:  Head: Normocephalic and atraumatic.  Mouth/Throat: Oropharynx is clear and moist.  Eyes: EOM are normal. Pupils are equal, round, and reactive to light.  Neck: Normal range of motion. Neck supple. No thyromegaly present.  Cardiovascular: Normal rate and regular rhythm.   Pulmonary/Chest: Effort normal and breath sounds normal. No respiratory distress. She has no wheezes. She has no rales.  Abdominal: Soft. Bowel sounds are normal. There is no tenderness. There is no rebound and no guarding.  Musculoskeletal: Normal range of motion. She exhibits no edema and no tenderness.       No calf tenderness or swelling  Neurological: She is alert and oriented to person, place, and time.       5/5 motor, sensation grossly intact  Skin: Skin is warm and dry. No rash noted. No erythema.  Psychiatric: She has a normal mood and affect. Her  behavior is normal.    ED Course  Procedures (including critical care time)  Labs Reviewed  CBC - Abnormal; Notable for the following:    RBC 5.31 (*)    HCT 35.9 (*)    MCV 67.6 (*)    MCH 23.2 (*)    All other components within normal limits  DIFFERENTIAL - Abnormal; Notable for the following:    Neutrophils Relative 81 (*)    All other components within normal limits  COMPREHENSIVE METABOLIC PANEL - Abnormal; Notable for the following:    Glucose, Bld 154 (*)    All other components within normal limits  D-DIMER, QUANTITATIVE - Abnormal; Notable for the following:    D-Dimer, Quant 0.90 (*)    All other components within  normal limits  POCT I-STAT TROPONIN I  TSH  T4, FREE   Dg Chest 2 View  12/02/2011  *RADIOLOGY REPORT*  Clinical Data: Chest pain, shortness of breath, left arm numbness  CHEST - 2 VIEW  Comparison: Chest x-ray of 09/17/2009  Findings: The lungs are clear.  Mediastinal contours appear normal. The heart is within normal limits in size.  No bony abnormality is seen.  IMPRESSION: No active lung disease.  Original Report Authenticated By: Juline Patch, M.D.   Ct Angio Chest W/cm &/or Wo Cm  12/02/2011  *RADIOLOGY REPORT*  Clinical Data: Chest pain, history of hyperthyroidism, hypertension, tachycardia  CT ANGIOGRAPHY CHEST  Technique:  Multidetector CT imaging of the chest using the standard protocol during bolus administration of intravenous contrast. Multiplanar reconstructed images including MIPs were obtained and reviewed to evaluate the vascular anatomy.  Contrast: OMNIPAQUE IOHEXOL 350 MG/ML SOLN  Comparison: None  Findings: Aorta normal caliber without aneurysm or dissection. Respiratory motion artifacts slightly degrade assessment of lower lobe pulmonary arteries. However, an irregular filling defect is identified in a left lower lobe pulmonary artery consistent with pulmonary embolism. No other pulmonary emboli are visualized. No thoracic adenopathy. Visualized portion of upper abdomen normal appearance. Lungs clear. No pleural effusion or pneumothorax. No acute osseous findings.  IMPRESSION: Irregular filling defect in a left lower lobe pulmonary artery consistent with pulmonary embolism. No other definite intrathoracic abnormalities identified.  Critical Value/emergent results were called by telephone at the time of interpretation on 12/02/2011  at 1807 hours  to Dr.Journei Thomassen, who verbally acknowledged these results.  Original Report Authenticated By: Lollie Marrow, M.D.     1. PE (pulmonary embolism)      Date: 12/02/2011  Rate: 89  Rhythm: normal sinus rhythm  QRS Axis: normal   Intervals: normal  ST/T Wave abnormalities: nonspecific T wave changes  Conduction Disutrbances:none  Narrative Interpretation:   Old EKG Reviewed: unchanged    MDM   Pt states she is feeling better after ativan. CP resolved.  Will discuss admission with [facility]       Loren Racer, MD 12/02/11 [phone]

## 2011-12-02 NOTE — ED Notes (Signed)
Patient transported to X-ray 

## 2011-12-02 NOTE — H&P (Signed)
PCP:   Angelica Chessman., MD, MD   Chief Complaint:  Chest pain   HPI: 41 yo woman with hx of PTSD, SVT presented to the PCP office with sudden onset chest pain , palpitations and dyspnea.  In the Ed she was found to have a PE and she is admitted for further w/u and obs Patient denies recent surgery  Had trauma to the right knee 1 year ago She travelled to [place] 2 wks ago She denies personal hx of dvt She is not taking oral contraceptives   Review of Systems:  Per HPI also with anxiety  Recent obs admission for TIA in [place]  All other systems reviewed and negative  Past Medical History: Past Medical History  Diagnosis Date  . Tachycardia   . Hypertension   . Thyroid disease   . Sickle cell trait   . Uveitis    Past Surgical History  Procedure Date  . Other surgical history tibia fx     Medications: Prior to Admission medications   Medication Sig Start Date End Date Taking? Authorizing Provider  amLODipine (NORVASC) 10 MG tablet Take 10 mg by mouth daily.   Yes Historical Provider, MD  aspirin 81 MG tablet Take 81 mg by mouth daily.   Yes Historical Provider, MD  LORazepam (ATIVAN) 0.5 MG tablet Take 0.5 mg by mouth daily as needed. For anxiety.   Yes Historical Provider, MD  metoprolol succinate (TOPROL-XL) 25 MG 24 hr tablet Take 25 mg by mouth 2 (two) times daily.    Yes Historical Provider, MD    Allergies:  No Known Allergies  Social History:  reports that she has never smoked. She does not have any smokeless tobacco history on file. She reports that she does not drink alcohol or use illicit drugs.  History   Social History Narrative   Lives with husband     Family History: Family History  Problem Relation Age of Onset  . Stroke Mother   . Cancer Mother   . Hypertension Mother   . Sarcoidosis Mother   . Stroke Father   . Hypertension Father   . Hyperlipidemia Father     Physical Exam: Filed Vitals:   12/02/11 1342 12/02/11 1523  BP:  121/78 121/77  Pulse: 95 93  Temp: 97 F (36.1 C)   TempSrc: Oral   Resp: 18 14  SpO2: 98% 99%   General appearance: alert, cooperative, appears stated age and no distress Head: Normocephalic, without obvious abnormality, atraumatic Eyes: conjunctivae/corneas clear. PERRL, EOM's intact. Fundi benign. Nose: Nares normal. Septum midline. Mucosa normal. No drainage or sinus tenderness. Throat: lips, mucosa, and tongue normal; teeth and gums normal Neck: no adenopathy, no carotid bruit, no JVD, supple, symmetrical, trachea midline and thyroid not enlarged, symmetric, no tenderness/mass/nodules Back: symmetric, no curvature. ROM normal. No CVA tenderness. Resp: clear to auscultation bilaterally Chest wall: no tenderness Cardio: regular rate and rhythm, S1, S2 normal, no murmur, click, rub or gallop GI: soft, non-tender; bowel sounds normal; no masses,  no organomegaly Extremities: extremities normal, atraumatic, no cyanosis or edema Pulses: 2+ and symmetric Skin: Skin color, texture, turgor normal. No rashes or lesions Neurologic: Alert and oriented X 3, normal strength and tone. Normal symmetric reflexes. Normal coordination and gait   Labs on Admission:   [facility] 12/02/11 1523  NA 137  K 4.0  CL 104  CO2 23  GLUCOSE 154*  BUN 14  CREATININE 0.64  CALCIUM 9.2  MG --  PHOS --  Basename 12/02/11 1523  AST 14  ALT 19  ALKPHOS 65  BILITOT 0.3  PROT 6.8  ALBUMIN 3.5   No results found for this basename: LIPASE:2,AMYLASE:2 in the last 72 hours  Basename 12/02/11 1523  WBC 6.6  NEUTROABS 5.3  HGB 12.3  HCT 35.9*  MCV 67.6*  PLT 245   No results found for this basename: CKTOTAL:3,CKMB:3,CKMBINDEX:3,TROPONINI:3 in the last 72 hours No results found for this basename: TSH,T4TOTAL,FREET3,T3FREE,THYROIDAB in the last 72 hours No results found for this basename: VITAMINB12:2,FOLATE:2,FERRITIN:2,TIBC:2,IRON:2,RETICCTPCT:2 in the last 72 hours  Radiological Exams on  Admission: Dg Chest 2 View  12/02/2011  *RADIOLOGY REPORT*  Clinical Data: Chest pain, shortness of breath, left arm numbness  CHEST - 2 VIEW  Comparison: Chest x-ray of 09/17/2009  Findings: The lungs are clear.  Mediastinal contours appear normal. The heart is within normal limits in size.  No bony abnormality is seen.  IMPRESSION: No active lung disease.  Original Report Authenticated By: Juline Patch, M.D.   Ct Angio Chest W/cm &/or Wo Cm  12/02/2011  *RADIOLOGY REPORT*  Clinical Data: Chest pain, history of hyperthyroidism, hypertension, tachycardia  CT ANGIOGRAPHY CHEST  Technique:  Multidetector CT imaging of the chest using the standard protocol during bolus administration of intravenous contrast. Multiplanar reconstructed images including MIPs were obtained and reviewed to evaluate the vascular anatomy.  Contrast: OMNIPAQUE IOHEXOL 350 MG/ML SOLN  Comparison: None  Findings: Aorta normal caliber without aneurysm or dissection. Respiratory motion artifacts slightly degrade assessment of lower lobe pulmonary arteries. However, an irregular filling defect is identified in a left lower lobe pulmonary artery consistent with pulmonary embolism. No other pulmonary emboli are visualized. No thoracic adenopathy. Visualized portion of upper abdomen normal appearance. Lungs clear. No pleural effusion or pneumothorax. No acute osseous findings.  IMPRESSION: Irregular filling defect in a left lower lobe pulmonary artery consistent with pulmonary embolism. No other definite intrathoracic abnormalities identified.  Critical Value/emergent results were called by telephone at the time of interpretation on 12/02/2011  at 1807 hours  to Dr.Yelverton, who verbally acknowledged these results.  Original Report Authenticated By: Lollie Marrow, M.D.    Assessment/Plan Present on Admission:   .PE (pulmonary embolism) Unclear the etiology. Will consider recent travel and leg trauma- will start lovenox tonight and  monitor. Will check LE doppler to r/o dvt. Will transition to oral xarelto tomorrow    .SVT (supraventricular tachycardia) None current  BB bid  .Hypertension Resume meds   .Thyroid disease Awaiting radioactive iodine test   Shaneika Rossa 12/02/2011, 7:12 PM

## 2011-12-02 NOTE — Progress Notes (Signed)
ANTICOAGULATION CONSULT NOTE - Initial Consult  Pharmacy Consult for Lovenox Indication: pulmonary embolus  No Known Allergies  Patient Measurements:    Vital Signs: Temp: 97 F (36.1 C) (04/17 1342) Temp src: Oral (04/17 1342) BP: 121/77 mmHg (04/17 1523) Pulse Rate: 93  (04/17 1523)  Labs:  Basename 12/02/11 1523  HGB 12.3  HCT 35.9*  PLT 245  APTT --  LABPROT --  INR --  HEPARINUNFRC --  CREATININE 0.64  CKTOTAL --  CKMB --  TROPONINI --   CrCl is unknown because there is no height on file for the current visit.  Medical History: Past Medical History  Diagnosis Date  . Tachycardia   . Hypertension   . Thyroid disease    Assessment: 41 yo F admitted with chest tightness, found to have PE on CT.  To begin Lovenox.  CBC okay.  Spoke with patient, no recent bleeding episodes.  Reports weight to be 179 lbs.  On ASA at home.    Goal of Therapy:  Anti-Xa level 0.6-1.2 units/ml 4 hr after dose   Plan:  1.  Lovenox 80 mg sq q12hr 2.  CBC q72hr 3.  Check baseline INR 4.  F/up oral anticoagulation  Rolland Porter, Pharm.D., BCPS Clinical Pharmacist Pager: [phone]

## 2011-12-02 NOTE — ED Notes (Signed)
Per EMS pt from [facility] with c/o chest pain. MD noticed EKG changes and had her sent out for further evaluation. Pt received 324 baby aspirin and two nitro, no chest pain on arrival. Reports nausea, dizziness. Chest pain began around 12pm.

## 2011-12-02 NOTE — ED Notes (Signed)
Admission MD at bedside. Waiting on admission orders for pt to be transported to floor.

## 2011-12-02 NOTE — ED Notes (Signed)
Patient transported to CT 

## 2011-12-03 ENCOUNTER — Inpatient Hospital Stay (HOSPITAL_COMMUNITY): Admission: RE | Admit: 2011-12-03 | Payer: Worker's Compensation | Source: Ambulatory Visit

## 2011-12-03 DIAGNOSIS — I2699 Other pulmonary embolism without acute cor pulmonale: Secondary | ICD-10-CM

## 2011-12-03 LAB — CBC
HCT: 35.4 % — ABNORMAL LOW (ref 36.0–46.0)
MCHC: 34.2 g/dL (ref 30.0–36.0)
RDW: 14.2 % (ref 11.5–15.5)

## 2011-12-03 LAB — BASIC METABOLIC PANEL
BUN: 11 mg/dL (ref 6–23)
Chloride: 104 mEq/L (ref 96–112)
Creatinine, Ser: 0.64 mg/dL (ref 0.50–1.10)
GFR calc Af Amer: >90 mL/min (ref >90)
GFR calc non Af Amer: >90 mL/min (ref >90)

## 2011-12-03 LAB — PROTIME-INR: INR: 1.2 (ref 0.00–1.49)

## 2011-12-03 MED ORDER — RIVAROXABAN 15 MG PO TABS: 15.0000 mg | ORAL_TABLET | Freq: Two times a day (BID) | ORAL | Status: DC

## 2011-12-03 MED ORDER — METHIMAZOLE 20 MG PO TABS: 20.0000 mg | ORAL_TABLET | Freq: Every day | ORAL | Status: DC

## 2011-12-03 MED ORDER — CITALOPRAM HYDROBROMIDE 10 MG PO TABS: 10.0000 mg | ORAL_TABLET | Freq: Every day | ORAL | Status: DC

## 2011-12-03 MED ORDER — METHIMAZOLE 10 MG PO TABS
20.0000 mg | ORAL_TABLET | Freq: Every day | ORAL | Status: DC
  Administered 2011-12-03 – 2011-12-04 (×2): 20 mg via ORAL
  Filled 2011-12-03 (×4): qty 2

## 2011-12-03 MED ORDER — RIVAROXABAN 15 MG PO TABS
15.0000 mg | ORAL_TABLET | Freq: Two times a day (BID) | ORAL | Status: DC
  Filled 2011-12-03 (×3): qty 1

## 2011-12-03 MED ORDER — RIVAROXABAN 15 MG PO TABS
15.0000 mg | ORAL_TABLET | Freq: Two times a day (BID) | ORAL | Status: DC
  Administered 2011-12-03 – 2011-12-04 (×2): 15 mg via ORAL
  Filled 2011-12-03 (×4): qty 1

## 2011-12-03 NOTE — Progress Notes (Signed)
Utilization Review Completed.Saloni Lablanc T4/18/2013   

## 2011-12-03 NOTE — Progress Notes (Addendum)
Subjective: No new events        Physical Exam: Blood pressure 133/85, pulse 94, temperature 98.4 F (36.9 C), temperature source Oral, resp. rate 20, height 5\' 4"  (1.626 m), weight 81.9 kg (180 lb 8.9 oz), last menstrual period 11/13/2011, SpO2 98.00%. Patient Vitals for the past 24 hrs:  BP Temp Temp src Pulse Resp SpO2 Height Weight  12/03/11 0759 133/85 mmHg 98.4 F (36.9 C) Oral 94  20  98 % - -  12/03/11 0529 131/81 mmHg 97 F (36.1 C) Oral 108  18  100 % - -  12/02/11 2029 122/84 mmHg 98 F (36.7 C) Oral 100  18  99 % 5\' 4"  (1.626 m) 81.9 kg (180 lb 8.9 oz)  12/02/11 1930 - - - - - - 5\' 4"  (1.626 m) 81.194 kg (179 lb)  12/02/11 1523 121/77 mmHg - - 93  14  99 % - -  12/02/11 1342 121/78 mmHg 97 F (36.1 C) Oral 95  18  98 % - -      Investigations:  No results found for this or any previous visit (from the past 240 hour(s)).   Basic Metabolic Panel:  Basename 12/03/11 0640 12/02/11 1523  NA 139 137  K 3.9 4.0  CL 104 104  CO2 22 23  GLUCOSE 90 154*  BUN 11 14  CREATININE 0.64 0.64  CALCIUM 9.5 9.2  MG -- --  PHOS -- --   Liver Function Tests:  Basename 12/02/11 1523  AST 14  ALT 19  ALKPHOS 65  BILITOT 0.3  PROT 6.8  ALBUMIN 3.5    Results for SUPRINA, MANDEVILLE (MRN [id number]) as of 12/03/2011 08:57  Ref. Range 11/13/2011 02:10 12/02/2011 15:21  TSH Latest Range: 0.350-4.500 uIU/mL 0.015 (L) <0.008 (L)  Free T4 Latest Range: 0.80-1.80 ng/dL  4.09 (H)   CBC:  Basename 12/03/11 0640 12/02/11 1523  WBC 6.4 6.6  NEUTROABS -- 5.3  HGB 12.1 12.3  HCT 35.4* 35.9*  MCV 67.0* 67.6*  PLT 242 245    Dg Chest 2 View  12/02/2011  *RADIOLOGY REPORT*  Clinical Data: Chest pain, shortness of breath, left arm numbness  CHEST - 2 VIEW  Comparison: Chest x-ray of 09/17/2009  Findings: The lungs are clear.  Mediastinal contours appear normal. The heart is within normal limits in size.  No bony abnormality is seen.  IMPRESSION: No active lung disease.   Original Report Authenticated By: Juline Patch, M.D.   Ct Angio Chest W/cm &/or Wo Cm  12/02/2011  *RADIOLOGY REPORT*  Clinical Data: Chest pain, history of hyperthyroidism, hypertension, tachycardia  CT ANGIOGRAPHY CHEST  Technique:  Multidetector CT imaging of the chest using the standard protocol during bolus administration of intravenous contrast. Multiplanar reconstructed images including MIPs were obtained and reviewed to evaluate the vascular anatomy.  Contrast: OMNIPAQUE IOHEXOL 350 MG/ML SOLN  Comparison: None  Findings: Aorta normal caliber without aneurysm or dissection. Respiratory motion artifacts slightly degrade assessment of lower lobe pulmonary arteries. However, an irregular filling defect is identified in a left lower lobe pulmonary artery consistent with pulmonary embolism. No other pulmonary emboli are visualized. No thoracic adenopathy. Visualized portion of upper abdomen normal appearance. Lungs clear. No pleural effusion or pneumothorax. No acute osseous findings.  IMPRESSION: Irregular filling defect in a left lower lobe pulmonary artery consistent with pulmonary embolism. No other definite intrathoracic abnormalities identified.  Critical Value/emergent results were called by telephone at the time of interpretation on 12/02/2011  at 1807  hours  to [facility], who verbally acknowledged these results.  Original Report Authenticated By: Lollie Marrow, M.D.      Medications:  Scheduled:    . amLODipine  10 mg Oral Daily  . aspirin EC  81 mg Oral Daily  . citalopram  10 mg Oral Daily  . enoxaparin (LOVENOX) injection  80 mg Subcutaneous To ER  . LORazepam  1 mg Intravenous Once  . metoprolol succinate  25 mg Oral BID  . rivaroxaban  15 mg Oral BID  . sodium chloride  1,000 mL Intravenous Once  . sodium chloride  3 mL Intravenous Q12H  . DISCONTD: aspirin  81 mg Oral Daily  . DISCONTD: enoxaparin (LOVENOX) injection  80 mg Subcutaneous Q12H   Continuous:    YNW:[id number], acetaminophen, iohexol, LORazepam  Impression:  Principal Problem:  *PE (pulmonary embolism) Active Problems:  TIA (transient ischemic attack)  SVT (supraventricular tachycardia)  Hypertension  Hyperthyroidism without goiter     Plan: Plan for xarelto as outpt D/w with primary endocrinologist today - we will start methimazole 20 mg daily  F/u with pcp and endo in 1 month      LOS: 1 day   Clifton Kovacic, MD Pager: [phone] 12/03/2011, 9:01 AM   pager [phone]

## 2011-12-03 NOTE — Discharge Summary (Signed)
Patient ID: Jamie Barajas MRN: [id number] DOB/AGE: 17-Jul-1971 41 y.o. Primary Care Physician:AGUIAR,RAFAELA M., MD, MD Admit date: 12/02/2011 Discharge date: 12/04/2011    Discharge Diagnoses:  Pulmonary Embolism Principal Problem:  PE (pulmonary embolism) Active Problems:  TIA (transient ischemic attack)  SVT (supraventricular tachycardia)  Hypertension  Hyperthyroidism without goiter   Medication List  As of 12/04/2011  9:01 AM   START taking these medications         methimazole 20 MG tablet   Commonly known as: TAPAZOLE   Take 1 tablet (20 mg total) by mouth daily.      Rivaroxaban 15 MG Tabs tablet   Commonly known as: XARELTO   Take 1 tablet (15 mg total) by mouth 2 (two) times daily with a meal.         CONTINUE taking these medications         amLODipine 10 MG tablet   Commonly known as: NORVASC      aspirin 81 MG tablet      citalopram 10 MG tablet   Commonly known as: CELEXA   Take 1 tablet (10 mg total) by mouth daily.      LORazepam 0.5 MG tablet   Commonly known as: ATIVAN      metoprolol succinate 25 MG 24 hr tablet   Commonly known as: TOPROL-XL          Where to get your medications    These are the prescriptions that you need to pick up.   You may get these medications from any pharmacy.         methimazole 20 MG tablet   Rivaroxaban 15 MG Tabs tablet         Information on where to get these meds is not yet available. Ask your nurse or doctor.         citalopram 10 MG tablet            Discharged Condition: Stable    Consults: None  Significant Diagnostic Studies: Dg Chest 2 View  12/02/2011  *RADIOLOGY REPORT*  Clinical Data: Chest pain, shortness of breath, left arm numbness  CHEST - 2 VIEW  Comparison: Chest x-ray of 09/17/2009  Findings: The lungs are clear.  Mediastinal contours appear normal. The heart is within normal limits in size.  No bony abnormality is seen.  IMPRESSION: No active lung disease.  Original  Report Authenticated By: Juline Patch, M.D.   Ct Angio Chest W/cm &/or Wo Cm  12/02/2011  *RADIOLOGY REPORT*  Clinical Data: Chest pain, history of hyperthyroidism, hypertension, tachycardia  CT ANGIOGRAPHY CHEST  Technique:  Multidetector CT imaging of the chest using the standard protocol during bolus administration of intravenous contrast. Multiplanar reconstructed images including MIPs were obtained and reviewed to evaluate the vascular anatomy.  Contrast: OMNIPAQUE IOHEXOL 350 MG/ML SOLN  Comparison: None  Findings: Aorta normal caliber without aneurysm or dissection. Respiratory motion artifacts slightly degrade assessment of lower lobe pulmonary arteries. However, an irregular filling defect is identified in a left lower lobe pulmonary artery consistent with pulmonary embolism. No other pulmonary emboli are visualized. No thoracic adenopathy. Visualized portion of upper abdomen normal appearance. Lungs clear. No pleural effusion or pneumothorax. No acute osseous findings.  IMPRESSION: Irregular filling defect in a left lower lobe pulmonary artery consistent with pulmonary embolism. No other definite intrathoracic abnormalities identified.  Critical Value/emergent results were called by telephone at the time of interpretation on 12/02/2011  at 1807 hours  to Dr.Yelverton, who verbally  acknowledged these results.  Original Report Authenticated By: Lollie Marrow, M.D.    Lab Results: Results for orders placed during the hospital encounter of 12/02/11 (from the past 48 hour(s))  D-DIMER, QUANTITATIVE     Status: Abnormal   Collection Time   12/02/11  3:21 PM      Component Value Range Comment   D-Dimer, Quant 0.90 (*) 0.00 - 0.48 (ug/mL-FEU)   TSH     Status: Abnormal   Collection Time   12/02/11  3:21 PM      Component Value Range Comment   TSH <0.008 (*) 0.350 - 4.500 (uIU/mL)   T4, FREE     Status: Abnormal   Collection Time   12/02/11  3:21 PM      Component Value Range Comment    Free T4 2.10 (*) 0.80 - 1.80 (ng/dL)   CBC     Status: Abnormal   Collection Time   12/02/11  3:23 PM      Component Value Range Comment   WBC 6.6  4.0 - 10.5 (K/uL)    RBC 5.31 (*) 3.87 - 5.11 (MIL/uL)    Hemoglobin 12.3  12.0 - 15.0 (g/dL)    HCT 91.4 (*) 78.2 - 46.0 (%)    MCV 67.6 (*) 78.0 - 100.0 (fL)    MCH 23.2 (*) 26.0 - 34.0 (pg)    MCHC 34.3  30.0 - 36.0 (g/dL)    RDW 95.6  21.3 - 08.6 (%)    Platelets 245  150 - 400 (K/uL)   DIFFERENTIAL     Status: Abnormal   Collection Time   12/02/11  3:23 PM      Component Value Range Comment   Neutrophils Relative 81 (*) 43 - 77 (%)    Lymphocytes Relative 12  12 - 46 (%)    Monocytes Relative 7  3 - 12 (%)    Eosinophils Relative 0  0 - 5 (%)    Basophils Relative 0  0 - 1 (%)    Neutro Abs 5.3  1.7 - 7.7 (K/uL)    Lymphs Abs 0.8  0.7 - 4.0 (K/uL)    Monocytes Absolute 0.5  0.1 - 1.0 (K/uL)    Eosinophils Absolute 0.0  0.0 - 0.7 (K/uL)    Basophils Absolute 0.0  0.0 - 0.1 (K/uL)    Smear Review MORPHOLOGY UNREMARKABLE     COMPREHENSIVE METABOLIC PANEL     Status: Abnormal   Collection Time   12/02/11  3:23 PM      Component Value Range Comment   Sodium 137  135 - 145 (mEq/L)    Potassium 4.0  3.5 - 5.1 (mEq/L)    Chloride 104  96 - 112 (mEq/L)    CO2 23  19 - 32 (mEq/L)    Glucose, Bld 154 (*) 70 - 99 (mg/dL)    BUN 14  6 - 23 (mg/dL)    Creatinine, Ser 5.78  0.50 - 1.10 (mg/dL)    Calcium 9.2  8.4 - 10.5 (mg/dL)    Total Protein 6.8  6.0 - 8.3 (g/dL)    Albumin 3.5  3.5 - 5.2 (g/dL)    AST 14  0 - 37 (U/L)    ALT 19  0 - 35 (U/L)    Alkaline Phosphatase 65  39 - 117 (U/L)    Total Bilirubin 0.3  0.3 - 1.2 (mg/dL)    GFR calc non Af Amer >90  >90 (mL/min)    GFR  calc Af Amer >90  >90 (mL/min)   POCT I-STAT TROPONIN I     Status: Normal   Collection Time   12/02/11  3:35 PM      Component Value Range Comment   Troponin i, poc 0.00  0.00 - 0.08 (ng/mL)    Comment 3            BASIC METABOLIC PANEL     Status: Normal     Collection Time   12/03/11  6:40 AM      Component Value Range Comment   Sodium 139  135 - 145 (mEq/L)    Potassium 3.9  3.5 - 5.1 (mEq/L)    Chloride 104  96 - 112 (mEq/L)    CO2 22  19 - 32 (mEq/L)    Glucose, Bld 90  70 - 99 (mg/dL)    BUN 11  6 - 23 (mg/dL)    Creatinine, Ser 4.09  0.50 - 1.10 (mg/dL)    Calcium 9.5  8.4 - 10.5 (mg/dL)    GFR calc non Af Amer >90  >90 (mL/min)    GFR calc Af Amer >90  >90 (mL/min)   CBC     Status: Abnormal   Collection Time   12/03/11  6:40 AM      Component Value Range Comment   WBC 6.4  4.0 - 10.5 (K/uL)    RBC 5.28 (*) 3.87 - 5.11 (MIL/uL)    Hemoglobin 12.1  12.0 - 15.0 (g/dL)    HCT 81.1 (*) 91.4 - 46.0 (%)    MCV 67.0 (*) 78.0 - 100.0 (fL)    MCH 22.9 (*) 26.0 - 34.0 (pg)    MCHC 34.2  30.0 - 36.0 (g/dL)    RDW 78.2  95.6 - 21.3 (%)    Platelets 242  150 - 400 (K/uL)   PROTIME-INR     Status: Abnormal   Collection Time   12/03/11  6:40 AM      Component Value Range Comment   Prothrombin Time 15.5 (*) 11.6 - 15.2 (seconds)    INR 1.20  0.00 - 1.49    CBC     Status: Abnormal   Collection Time   12/04/11  5:50 AM      Component Value Range Comment   WBC 5.7  4.0 - 10.5 (K/uL)    RBC 5.18 (*) 3.87 - 5.11 (MIL/uL)    Hemoglobin 11.9 (*) 12.0 - 15.0 (g/dL)    HCT 08.6 (*) 57.8 - 46.0 (%)    MCV 67.6 (*) 78.0 - 100.0 (fL)    MCH 23.0 (*) 26.0 - 34.0 (pg)    MCHC 34.0  30.0 - 36.0 (g/dL)    RDW 46.9  62.9 - 52.8 (%)    Platelets 211  150 - 400 (K/uL)    No results found for this or any previous visit (from the past 240 hour(s)).   Hospital Course:  Pulmonary Embolism Patient was admitted with a PE per CT angiogram done in the ED. It was felt that patient's PE was likely secondary to recent travel and leg trauma. Patient was started on a full dose Lovenox. Lower extremity Dopplers were obtained which were negative for DVT. Patient was maintained on a home dose beta blocker and monitored. Patient was subsequently transitioned to  xarelto which she tolerated. Patient will be discharged home on his arousal with close followup with her PCP as outpatient.  Thyroid disease During the hospitalization a TSH was obtained which came  back less than 0.008. A free T4 was subsequently obtained which came back  elevated at 2.10. Patient's case was discussed by Dr. Lavera Guise with the patient's endocrinologist who had recommended patient be started on methimazole at 20 mg daily with close followup as outpatient. Patient was already on a beta blocker and was maintained on this for rate control. Patient was discharged home in stable condition with close followup with her endocrinologist as outpatient for further evaluation and management.  The rest of patient's chronic medical issues remained stable throughout the hospitalization.    Discharge Exam: Blood pressure 126/77, pulse 113, temperature 98.2 F (36.8 C), temperature source Oral, resp. rate 20, height 5\' 4"  (1.626 m), weight 81.9 kg (180 lb 8.9 oz), last menstrual period 11/13/2011, SpO2 98.00%. Subjective: Patient denies any chest pain, no shortness of breath.  General: Alert, awake, oriented x3, in no acute distress. HEENT: No bruits, no goiter. Heart: Regular rate and rhythm, without murmurs, rubs, gallops. Lungs: Clear to auscultation bilaterally. Abdomen: Soft, nontender, nondistended, positive bowel sounds. Extremities: No clubbing cyanosis or edema with positive pedal pulses. Neuro: Grossly intact, nonfocal.    Disposition: Home  Discharge Orders    Future Orders Please Complete By Expires   Diet - low sodium heart healthy      Increase activity slowly      Discharge instructions      Comments:   Follow up with Angelica Chessman., MD, in 2 weeks Follow up with endocrinologist in 1 month       Follow-up Information    Follow up with Lenord Fellers, MD. Schedule an appointment as soon as possible for a visit in 1 month.   Contact information:   [address], [place] [address] [place] [address] [phone]       Follow up with Angelica Chessman., MD. Schedule an appointment as soon as possible for a visit in 2 weeks.   Contact information:   [address] [place]. [address] [place] [address] [phone]          Signed: Ramiro Harvest 12/04/2011, 9:01 AM

## 2011-12-03 NOTE — Progress Notes (Signed)
Patient felt palpitation. The monitor tech called shortly after and reported a HR of 140 that decreased to 90's. The patient is rest and feels slightly lightheaded with movement. Bp 104/71. Has extended release Lopressor. The patient would like to take the Lopressor a little later since her BP is lower for her, which concerns her. Instructed patient try drinking water and I will monitor her heart. If her heart rate increases again I told her I will give her the lopressor then rather than waiting and check her blood pressure. The patient is comfortable with this plan, and hopefully this will decrease her anxiety.

## 2011-12-04 ENCOUNTER — Other Ambulatory Visit (HOSPITAL_COMMUNITY): Payer: Worker's Compensation

## 2011-12-04 ENCOUNTER — Encounter (HOSPITAL_COMMUNITY): Payer: Self-pay | Admitting: *Deleted

## 2011-12-04 LAB — CBC
HCT: 35 % — ABNORMAL LOW (ref 36.0–46.0)
MCHC: 34 g/dL (ref 30.0–36.0)
MCV: 67.6 fL — ABNORMAL LOW (ref 78.0–100.0)
RDW: 14.4 % (ref 11.5–15.5)

## 2011-12-04 MED ORDER — RIVAROXABAN 15 MG PO TABS: 15.0000 mg | ORAL_TABLET | Freq: Two times a day (BID) | ORAL | Status: DC

## 2011-12-04 MED ORDER — METHIMAZOLE 20 MG PO TABS: 20.0000 mg | ORAL_TABLET | Freq: Every day | ORAL | Status: DC

## 2011-12-09 ENCOUNTER — Other Ambulatory Visit: Payer: Self-pay

## 2011-12-09 ENCOUNTER — Encounter (HOSPITAL_COMMUNITY): Payer: Self-pay | Admitting: Emergency Medicine

## 2011-12-09 ENCOUNTER — Emergency Department (HOSPITAL_COMMUNITY)
Admission: EM | Admit: 2011-12-09 | Discharge: 2011-12-10 | Disposition: A | Discharge status: Home / Self Care | Payer: BC Managed Care – PPO | Attending: Emergency Medicine | Admitting: Emergency Medicine

## 2011-12-09 DIAGNOSIS — I498 Other specified cardiac arrhythmias: Secondary | ICD-10-CM | POA: Insufficient documentation

## 2011-12-09 DIAGNOSIS — G4730 Sleep apnea, unspecified: Secondary | ICD-10-CM | POA: Insufficient documentation

## 2011-12-09 DIAGNOSIS — R002 Palpitations: Secondary | ICD-10-CM

## 2011-12-09 DIAGNOSIS — R079 Chest pain, unspecified: Secondary | ICD-10-CM | POA: Insufficient documentation

## 2011-12-09 DIAGNOSIS — D573 Sickle-cell trait: Secondary | ICD-10-CM | POA: Insufficient documentation

## 2011-12-09 DIAGNOSIS — E861 Hypovolemia: Secondary | ICD-10-CM

## 2011-12-09 DIAGNOSIS — N920 Excessive and frequent menstruation with regular cycle: Secondary | ICD-10-CM | POA: Insufficient documentation

## 2011-12-09 DIAGNOSIS — I10 Essential (primary) hypertension: Secondary | ICD-10-CM | POA: Insufficient documentation

## 2011-12-09 DIAGNOSIS — E0590 Thyrotoxicosis, unspecified without thyrotoxic crisis or storm: Secondary | ICD-10-CM | POA: Insufficient documentation

## 2011-12-09 LAB — CBC
HCT: 34.5 % — ABNORMAL LOW (ref 36.0–46.0)
Hemoglobin: 11.9 g/dL — ABNORMAL LOW (ref 12.0–15.0)
MCH: 22.9 pg — ABNORMAL LOW (ref 26.0–34.0)
MCHC: 34.5 g/dL (ref 30.0–36.0)
MCV: 66.5 fL — ABNORMAL LOW (ref 78.0–100.0)

## 2011-12-09 LAB — POCT I-STAT, CHEM 8
Chloride: 106 mEq/L (ref 96–112)
Creatinine, Ser: 0.7 mg/dL (ref 0.50–1.10)
Glucose, Bld: 135 mg/dL — ABNORMAL HIGH (ref 70–99)
HCT: 37 % (ref 36.0–46.0)
Potassium: 2.9 mEq/L — ABNORMAL LOW (ref 3.5–5.1)
Sodium: 142 mEq/L (ref 135–145)

## 2011-12-09 NOTE — ED Notes (Signed)
Pt complaining of short of breath. Pt placed on 2 lpm O2 N/C

## 2011-12-09 NOTE — ED Notes (Signed)
Patient with chest pain that started 20 minutes ago.   Patient states she is on a blood thinner, vaginal bleeding is heavier than normal.  Patient is tachycardic.  Patient just was discharged Friday for PE.

## 2011-12-10 ENCOUNTER — Encounter (HOSPITAL_COMMUNITY): Payer: Self-pay | Admitting: Radiology

## 2011-12-10 ENCOUNTER — Emergency Department (HOSPITAL_COMMUNITY): Payer: BC Managed Care – PPO

## 2011-12-10 LAB — POCT I-STAT TROPONIN I
Troponin i, poc: 0 ng/mL (ref 0.00–0.08)
Troponin i, poc: 0 ng/mL (ref 0.00–0.08)

## 2011-12-10 LAB — DIFFERENTIAL
Basophils Absolute: 0 10*3/uL (ref 0.0–0.1)
Basophils Relative: 0 % (ref 0–1)
Eosinophils Absolute: 0 10*3/uL (ref 0.0–0.7)
Eosinophils Relative: 0 % (ref 0–5)
Lymphocytes Relative: 21 % (ref 12–46)
Monocytes Absolute: 0.4 10*3/uL (ref 0.1–1.0)
Monocytes Relative: 8 % (ref 3–12)
Neutro Abs: 4 10*3/uL (ref 1.7–7.7)

## 2011-12-10 MED ORDER — LORAZEPAM 2 MG/ML IJ SOLN
1.0000 mg | Freq: Once | INTRAMUSCULAR | Status: AC
  Administered 2011-12-10: 1 mg via INTRAVENOUS
  Filled 2011-12-10: qty 1

## 2011-12-10 MED ORDER — POTASSIUM CHLORIDE CRYS ER 20 MEQ PO TBCR
40.0000 meq | EXTENDED_RELEASE_TABLET | Freq: Once | ORAL | Status: AC
  Administered 2011-12-10: 40 meq via ORAL
  Filled 2011-12-10: qty 2

## 2011-12-10 MED ORDER — SODIUM CHLORIDE 0.9 % IV SOLN
INTRAVENOUS | Status: DC
  Administered 2011-12-10: 05:00:00 via INTRAVENOUS

## 2011-12-10 MED ORDER — IOHEXOL 350 MG/ML SOLN
100.0000 mL | Freq: Once | INTRAVENOUS | Status: AC | PRN
  Administered 2011-12-10: 100 mL via INTRAVENOUS

## 2011-12-10 MED ORDER — POTASSIUM CHLORIDE 10 MEQ/100ML IV SOLN
10.0000 meq | Freq: Once | INTRAVENOUS | Status: AC
  Administered 2011-12-10: 10 meq via INTRAVENOUS
  Filled 2011-12-10: qty 100

## 2011-12-10 NOTE — ED Provider Notes (Signed)
History     CSN: [id number]  Arrival date & time 12/09/11  2239   First MD Initiated Contact with Patient 12/09/11 2256      Chief Complaint  Patient presents with  . Chest Pain    (Consider location/radiation/quality/duration/timing/severity/associated sxs/prior treatment) HPI Comments: Patient here with a 15 minute episode of right chest pain - states that she was just discharged from the hospital after having been diagnosed with a PE.  States that she has been transitioned to xarelto for the blood thinner and that she started her menstrual cycle yesterday and that her bleeding is heavier than normal - reports using 1 pad per every 2-3 hours, patient denies dizziness, syncope but has noticed increase in clotting with this.  No chest pain or shortness of breath at this time.  Patient is a 41 y.o. female presenting with chest pain and vaginal bleeding. The history is provided by the patient. No language interpreter was used.  Chest Pain The chest pain began less than 1 hour ago. Duration of episode(s) is 15 minutes. Chest pain occurs intermittently. The chest pain is resolved. At its most intense, the pain is at 4/10. The pain is currently at 0/10. The severity of the pain is mild. The quality of the pain is described as heavy. The pain does not radiate. Chest pain is worsened by deep breathing. Pertinent negatives for primary symptoms include no fever, no fatigue, no syncope, no shortness of breath, no cough, no wheezing, no palpitations, no abdominal pain, no nausea, no vomiting, no dizziness and no altered mental status.  Pertinent negatives for associated symptoms include no claudication, no diaphoresis, no lower extremity edema, no near-syncope, no numbness, no orthopnea, no paroxysmal nocturnal dyspnea and no weakness. She tried nothing for the symptoms.  Her past medical history is significant for PE.    Vaginal Bleeding This is a new problem. The current episode started yesterday.  The problem occurs constantly. The problem has been unchanged. Associated symptoms include chest pain. Pertinent negatives include no abdominal pain, anorexia, arthralgias, change in bowel habit, chills, congestion, coughing, diaphoresis, fatigue, fever, headaches, joint swelling, myalgias, nausea, neck pain, numbness, rash, sore throat, swollen glands, urinary symptoms, vertigo, visual change, vomiting or weakness. The symptoms are aggravated by nothing. She has tried nothing for the symptoms. The treatment provided no relief.    Past Medical History  Diagnosis Date  . Tachycardia   . Hypertension   . Sickle cell trait   . Uveitis   . Angina   . Pulmonary embolism on left 12/02/11  . Sleep apnea   . Hyperthyroidism   . Headache   . Anxiety   . PTSD (post-traumatic stress disorder)   . SVT (supraventricular tachycardia)     Past Surgical History  Procedure Date  . Knee arthroscopy w/ internal fixation tibial spine fracture ~ 2011    right    Family History  Problem Relation Age of Onset  . Stroke Mother   . Cancer Mother   . Hypertension Mother   . Sarcoidosis Mother   . Stroke Father   . Hypertension Father   . Hyperlipidemia Father     History  Substance Use Topics  . Smoking status: Never Smoker   . Smokeless tobacco: Never Used  . Alcohol Use: Yes     12/02/11 "glass of wine maybe 3 times/month"    OB History    Grav Para Term Preterm Abortions TAB SAB Ect Mult Living  Review of Systems  Constitutional: Negative for fever, chills, diaphoresis and fatigue.  HENT: Negative for congestion, sore throat and neck pain.   Respiratory: Negative for cough, shortness of breath and wheezing.   Cardiovascular: Positive for chest pain. Negative for palpitations, orthopnea, claudication, syncope and near-syncope.  Gastrointestinal: Negative for nausea, vomiting, abdominal pain, anorexia and change in bowel habit.  Genitourinary: Positive for vaginal  bleeding.  Musculoskeletal: Negative for myalgias, joint swelling and arthralgias.  Skin: Negative for rash.  Neurological: Negative for dizziness, vertigo, weakness, numbness and headaches.  Psychiatric/Behavioral: Negative for altered mental status.  All other systems reviewed and are negative.    Allergies  Review of patient's allergies indicates no known allergies.  Home Medications   Current Outpatient Rx  Name Route Sig Dispense Refill  . AMLODIPINE BESYLATE 10 MG PO TABS Oral Take 10 mg by mouth daily.    [name] LORAZEPAM 0.5 MG PO TABS Oral Take 0.5 mg by mouth daily as needed. For anxiety.    . METHIMAZOLE 20 MG PO TABS Oral Take 1 tablet (20 mg total) by mouth daily. 30 tablet 0  . METOPROLOL TARTRATE 25 MG PO TABS Oral Take 25 mg by mouth 2 (two) times daily.    [name] RIVAROXABAN 15 MG PO TABS Oral Take 1 tablet (15 mg total) by mouth 2 (two) times daily with a meal. 62 tablet 0    BP 119/76  Pulse 81  Temp(Src) 99 F (37.2 C) (Oral)  Resp 16  SpO2 100%  LMP 12/09/2011  Physical Exam  Nursing note and vitals reviewed. Constitutional: She is oriented to person, place, and time. She appears well-developed and well-nourished. No distress.  HENT:  Head: Normocephalic and atraumatic.  Right Ear: External ear normal.  Left Ear: External ear normal.  Nose: Nose normal.  Mouth/Throat: Oropharynx is clear and moist. No oropharyngeal exudate.  Eyes: Conjunctivae are normal. Pupils are equal, round, and reactive to light. No scleral icterus.  Neck: Normal range of motion. Neck supple.  Cardiovascular: Regular rhythm and normal heart sounds.  Exam reveals no gallop and no friction rub.   No murmur heard.      tachycardic  Pulmonary/Chest: Effort normal and breath sounds normal. No respiratory distress. She has no wheezes. She has no rales. She exhibits no tenderness.  Abdominal: Soft. Bowel sounds are normal. She exhibits no distension. There is no tenderness.  Genitourinary:  There is no rash or tenderness on the right labia. There is no rash or tenderness on the left labia. Uterus is not enlarged and not tender. Cervix exhibits no motion tenderness, no discharge and no friability. Right adnexum displays no mass and no tenderness. Left adnexum displays no mass and no tenderness. There is bleeding around the vagina. No tenderness around the vagina.  Musculoskeletal: Normal range of motion. She exhibits no edema and no tenderness.  Lymphadenopathy:    She has no cervical adenopathy.  Neurological: She is alert and oriented to person, place, and time. No cranial nerve deficit.  Skin: Skin is warm and dry. No rash noted. No erythema. No pallor.  Psychiatric: She has a normal mood and affect. Her behavior is normal. Judgment and thought content normal.    ED Course  Procedures (including critical care time)  Labs Reviewed  CBC - Abnormal; Notable for the following:    RBC 5.19 (*)    Hemoglobin 11.9 (*)    HCT 34.5 (*)    MCV 66.5 (*)    MCH 22.9 (*)  All other components within normal limits  POCT I-STAT, CHEM 8 - Abnormal; Notable for the following:    Potassium 2.9 (*)    Glucose, Bld 135 (*)    All other components within normal limits  DIFFERENTIAL  TYPE AND SCREEN   No results found.  Results for orders placed during the hospital encounter of 12/09/11  CBC      Component Value Range   WBC 5.6  4.0 - 10.5 (K/uL)   RBC 5.19 (*) 3.87 - 5.11 (MIL/uL)   Hemoglobin 11.9 (*) 12.0 - 15.0 (g/dL)   HCT 16.1 (*) 09.6 - 46.0 (%)   MCV 66.5 (*) 78.0 - 100.0 (fL)   MCH 22.9 (*) 26.0 - 34.0 (pg)   MCHC 34.5  30.0 - 36.0 (g/dL)   RDW 04.5  40.9 - 81.1 (%)   Platelets 192  150 - 400 (K/uL)  DIFFERENTIAL      Component Value Range   Neutrophils Relative 71  43 - 77 (%)   Lymphocytes Relative 21  12 - 46 (%)   Monocytes Relative 8  3 - 12 (%)   Eosinophils Relative 0  0 - 5 (%)   Basophils Relative 0  0 - 1 (%)   Neutro Abs 4.0  1.7 - 7.7 (K/uL)   Lymphs  Abs 1.2  0.7 - 4.0 (K/uL)   Monocytes Absolute 0.4  0.1 - 1.0 (K/uL)   Eosinophils Absolute 0.0  0.0 - 0.7 (K/uL)   Basophils Absolute 0.0  0.0 - 0.1 (K/uL)   Smear Review LARGE PLATELETS PRESENT    TYPE AND SCREEN      Component Value Range   ABO/RH(D) O POS     Antibody Screen PENDING     Sample Expiration 12/12/2011    POCT I-STAT, CHEM 8      Component Value Range   Sodium 142  135 - 145 (mEq/L)   Potassium 2.9 (*) 3.5 - 5.1 (mEq/L)   Chloride 106  96 - 112 (mEq/L)   BUN 14  6 - 23 (mg/dL)   Creatinine, Ser 9.14  0.50 - 1.10 (mg/dL)   Glucose, Bld 782 (*) 70 - 99 (mg/dL)   Calcium, Ion 9.56  1.12 - 1.32 (mmol/L)   TCO2 22  0 - 100 (mmol/L)   Hemoglobin 12.6  12.0 - 15.0 (g/dL)   HCT 21.3  08.6 - 57.8 (%)   Dg Chest 2 View  12/02/2011  *RADIOLOGY REPORT*  Clinical Data: Chest pain, shortness of breath, left arm numbness  CHEST - 2 VIEW  Comparison: Chest x-ray of 09/17/2009  Findings: The lungs are clear.  Mediastinal contours appear normal. The heart is within normal limits in size.  No bony abnormality is seen.  IMPRESSION: No active lung disease.  Original Report Authenticated By: Juline Patch, M.D.   Ct Angio Chest W/cm &/or Wo Cm  12/02/2011  *RADIOLOGY REPORT*  Clinical Data: Chest pain, history of hyperthyroidism, hypertension, tachycardia  CT ANGIOGRAPHY CHEST  Technique:  Multidetector CT imaging of the chest using the standard protocol during bolus administration of intravenous contrast. Multiplanar reconstructed images including MIPs were obtained and reviewed to evaluate the vascular anatomy.  Contrast: OMNIPAQUE IOHEXOL 350 MG/ML SOLN  Comparison: None  Findings: Aorta normal caliber without aneurysm or dissection. Respiratory motion artifacts slightly degrade assessment of lower lobe pulmonary arteries. However, an irregular filling defect is identified in a left lower lobe pulmonary artery consistent with pulmonary embolism. No other pulmonary emboli are  visualized. No thoracic adenopathy. Visualized portion of upper abdomen normal appearance. Lungs clear. No pleural effusion or pneumothorax. No acute osseous findings.  IMPRESSION: Irregular filling defect in a left lower lobe pulmonary artery consistent with pulmonary embolism. No other definite intrathoracic abnormalities identified.  Critical Value/emergent results were called by telephone at the time of interpretation on 12/02/2011  at 1807 hours  to Dr.Yelverton, who verbally acknowledged these results.  Original Report Authenticated By: Lollie Marrow, M.D.    Date: 12/10/2011  Rate: 129  Rhythm: sinus tachycardia  QRS Axis: normal  Intervals: normal  ST/T Wave abnormalities: normal and nonspecific T wave changes  Conduction Disutrbances:none  Narrative Interpretation: Now with tachycardia - reviewed by Dr. Theodoro Kalata  Old EKG Reviewed: changes noted    Chest pain Heavy vaginal bleeding.    MDM  Patient here with one episode of transient chest pain and fluttering of her heart.  She has a PMH significant for PE as well as hyperthyroidism and was orthostatic on exam.  She is also here with heavier than normal vaginal bleeding with a normal hemoglobin.  Plan to repeat her CT scan to make sure there is no extension of the clot, she has had potassium replacement as well.        Izola Price [place], [place] 12/10/11 0157

## 2011-12-10 NOTE — Discharge Instructions (Signed)
Abnormal Uterine Bleeding  Rest today and be sure to drink plenty of fluids. Call your physician this morning to schedule recheck in the clinic in 48 hours. Return here for any worsening condition.   Abnormal uterine bleeding can have many causes. Some cases are simply treated, while others are more serious. There are several kinds of bleeding that is considered abnormal, including:  Bleeding between periods.  Bleeding after sexual intercourse.  Spotting anytime in the menstrual cycle.  Bleeding heavier or more than normal.  Bleeding after menopause.  CAUSES  There are many causes of abnormal uterine bleeding. It can be present in teenagers, pregnant women, women during their reproductive years, and women who have reached menopause. Your caregiver will look for the more common causes depending on your age, signs, symptoms and your particular circumstance. Most cases are not serious and can be treated. Even the more serious causes, like cancer of the female organs, can be treated adequately if found in the early stages. That is why all types of bleeding should be evaluated and treated as soon as possible.  DIAGNOSIS  Diagnosing the cause may take several kinds of tests. Your caregiver may:  Take a complete history of the type of bleeding.  Perform a complete physical exam and Pap smear.  Take an ultrasound on the abdomen showing a picture of the female organs and the pelvis.  Inject dye into the uterus and Fallopian tubes and X-ray them (hysterosalpingogram).  Place fluid in the uterus and do an ultrasound (sonohysterogrqphy).  Take a CT scan to examine the female organs and pelvis.  Take an MRI to examine the female organs and pelvis. There is no X-ray involved with this procedure.  Look inside the uterus with a telescope that has a light at the end (hysteroscopy).  Scrap the inside of the uterus to get tissue to examine (Dilatation and Curettage, D&C).  Look into the pelvis with a telescope  that has a light at the end (laparoscopy). This is done through a very small cut (incision) in the abdomen.  TREATMENT  Treatment will depend on the cause of the abnormal bleeding. It can include:  Doing nothing to allow the problem to take care of itself over time.  Hormone treatment.  Birth control pills.  Treating the medical condition causing the problem.  Laparoscopy.  Major or minor surgery  Destroying the lining of the uterus with electrical currant, laser, freezing or heat (uterine ablation).  HOME CARE INSTRUCTIONS  Follow your caregiver's recommendation on how to treat your problem.  See your caregiver if you missed a menstrual period and think you may be pregnant.  If you are bleeding heavily, count the number of pads/tampons you use and how often you have to change them. Tell this to your caregiver.  Avoid sexual intercourse until the problem is controlled.  SEEK MEDICAL CARE IF:  You have any kind of abnormal bleeding mentioned above.  You feel dizzy at times.  You are 41 years old and have not had a menstrual period yet.  SEEK IMMEDIATE MEDICAL CARE IF:  You pass out.  You are changing pads/tampons every 15 to 30 minutes.  You have belly (abdominal) pain.  You have a temperature of 100 F (37.8 C) or higher.  You become sweaty or weak.  You are passing large blood clots from the vagina.  You start to feel sick to your stomach (nauseous) and throw up (vomit).

## 2011-12-10 NOTE — ED Provider Notes (Signed)
Medical screening examination/treatment/procedure(s) were conducted as a shared visit with non-physician practitioner(s) and myself.  I personally evaluated the patient during the encounter. On exam heart regular rate and rhythm with lungs clear to auscultation. Intermittent tachycardia with standing and palpitations. Labs reviewed as below. Given chest pain, tachycardia and concern for increased clot burden a repeat CT scan was obtained. No increase PE. Recently evaluated for thyroid and is medicated. Patient states medical compliance. No significant anemia. After IV fluids, heart rate improved and is asymptomatic. Her presensation suggests symptomatic hypovolemia, improved in the ED. Serial negative cardiac enzymes. Plan followup primary care physician with GYN referral for increased vaginal bleeding - normal timing of menstrual period.   Results for orders placed during the hospital encounter of 12/09/11  CBC      Component Value Range   WBC 5.6  4.0 - 10.5 (K/uL)   RBC 5.19 (*) 3.87 - 5.11 (MIL/uL)   Hemoglobin 11.9 (*) 12.0 - 15.0 (g/dL)   HCT 16.1 (*) 09.6 - 46.0 (%)   MCV 66.5 (*) 78.0 - 100.0 (fL)   MCH 22.9 (*) 26.0 - 34.0 (pg)   MCHC 34.5  30.0 - 36.0 (g/dL)   RDW 04.5  40.9 - 81.1 (%)   Platelets 192  150 - 400 (K/uL)  DIFFERENTIAL      Component Value Range   Neutrophils Relative 71  43 - 77 (%)   Lymphocytes Relative 21  12 - 46 (%)   Monocytes Relative 8  3 - 12 (%)   Eosinophils Relative 0  0 - 5 (%)   Basophils Relative 0  0 - 1 (%)   Neutro Abs 4.0  1.7 - 7.7 (K/uL)   Lymphs Abs 1.2  0.7 - 4.0 (K/uL)   Monocytes Absolute 0.4  0.1 - 1.0 (K/uL)   Eosinophils Absolute 0.0  0.0 - 0.7 (K/uL)   Basophils Absolute 0.0  0.0 - 0.1 (K/uL)   Smear Review LARGE PLATELETS PRESENT    TYPE AND SCREEN      Component Value Range   ABO/RH(D) O POS     Antibody Screen NEG     Sample Expiration 12/12/2011    POCT I-STAT, CHEM 8      Component Value Range   Sodium 142  135 - 145 (mEq/L)    Potassium 2.9 (*) 3.5 - 5.1 (mEq/L)   Chloride 106  96 - 112 (mEq/L)   BUN 14  6 - 23 (mg/dL)   Creatinine, Ser 9.14  0.50 - 1.10 (mg/dL)   Glucose, Bld 782 (*) 70 - 99 (mg/dL)   Calcium, Ion 9.56  1.12 - 1.32 (mmol/L)   TCO2 22  0 - 100 (mmol/L)   Hemoglobin 12.6  12.0 - 15.0 (g/dL)   HCT 21.3  08.6 - 57.8 (%)  ABO/RH      Component Value Range   ABO/RH(D) O POS    POCT I-STAT TROPONIN I      Component Value Range   Troponin i, poc 0.00  0.00 - 0.08 (ng/mL)   Comment 3           POCT I-STAT TROPONIN I      Component Value Range   Troponin i, poc 0.00  0.00 - 0.08 (ng/mL)   Comment 3            Dg Chest 2 View  12/02/2011  *RADIOLOGY REPORT*  Clinical Data: Chest pain, shortness of breath, left arm numbness  CHEST - 2 VIEW  Comparison:  Chest x-ray of 09/17/2009  Findings: The lungs are clear.  Mediastinal contours appear normal. The heart is within normal limits in size.  No bony abnormality is seen.  IMPRESSION: No active lung disease.  Original Report Authenticated By: Juline Patch, M.D.   Ct Angio Chest W/cm &/or Wo Cm  12/10/2011  *RADIOLOGY REPORT*  Clinical Data: Chest pain and tachycardia beginning 20 minutes ago. Vaginal bleeding.  History of pulmonary embolus on Friday.  CT ANGIOGRAPHY CHEST  Technique:  Multidetector CT imaging of the chest using the standard protocol during bolus administration of intravenous contrast. Multiplanar reconstructed images including MIPs were obtained and reviewed to evaluate the vascular anatomy.  Contrast: OMNIPAQUE IOHEXOL 350 MG/ML SOLN  Comparison: 12/02/2011  Findings: Technically adequate study with moderately good opacification of the central and segmental pulmonary arteries.  No focal filling defects demonstrated.  No evidence of significant pulmonary embolus.  The left lower lobe pulmonary arteries are well opacified today without evidence of any significant residual pulmonary embolus.  Normal heart size.  Normal caliber thoracic  aorta.  No significant lymphadenopathy in the chest.  Esophagus is decompressed.  The small esophageal hiatal hernia.  Visualized portions of the upper abdominal organs are unremarkable.  No pleural effusion.  No focal airspace consolidation in the lungs.  No significant interstitial infiltration.  Airways appear patent.  Normal alignment of the thoracic vertebrae.    IMPRESSION: No significant residual pulmonary embolus.  No evidence of active consolidation in the lungs.  Original Report Authenticated By: Marlon Pel, M.D.   Ct Angio Chest W/cm &/or Wo Cm  12/02/2011  *RADIOLOGY REPORT*  Clinical Data: Chest pain, history of hyperthyroidism, hypertension, tachycardia  CT ANGIOGRAPHY CHEST  Technique:  Multidetector CT imaging of the chest using the standard protocol during bolus administration of intravenous contrast. Multiplanar reconstructed images including MIPs were obtained and reviewed to evaluate the vascular anatomy.  Contrast: OMNIPAQUE IOHEXOL 350 MG/ML SOLN  Comparison: None  Findings: Aorta normal caliber without aneurysm or dissection. Respiratory motion artifacts slightly degrade assessment of lower lobe pulmonary arteries. However, an irregular filling defect is identified in a left lower lobe pulmonary artery consistent with pulmonary embolism. No other pulmonary emboli are visualized. No thoracic adenopathy. Visualized portion of upper abdomen normal appearance. Lungs clear. No pleural effusion or pneumothorax. No acute osseous findings.   IMPRESSION: Irregular filling defect in a left lower lobe pulmonary artery consistent with pulmonary embolism. No other definite intrathoracic abnormalities identified.  Critical Value/emergent results were called by telephone at the time of interpretation on 12/02/2011  at 1807 hours  to Dr.Yelverton, who verbally acknowledged these results.  Original Report Authenticated By: Lollie Marrow, M.D.      Sunnie Nielsen, MD 12/10/11 [phone]

## 2011-12-10 NOTE — ED Notes (Signed)
Patient is AOx4 and comfortable with her discharge instructions. 

## 2011-12-11 ENCOUNTER — Emergency Department (HOSPITAL_COMMUNITY)
Admission: EM | Admit: 2011-12-11 | Discharge: 2011-12-11 | Disposition: A | Discharge status: Home Health | Payer: BC Managed Care – PPO | Attending: Emergency Medicine | Admitting: Emergency Medicine

## 2011-12-11 DIAGNOSIS — R0989 Other specified symptoms and signs involving the circulatory and respiratory systems: Secondary | ICD-10-CM | POA: Insufficient documentation

## 2011-12-11 DIAGNOSIS — R0609 Other forms of dyspnea: Secondary | ICD-10-CM | POA: Insufficient documentation

## 2011-12-11 DIAGNOSIS — G4730 Sleep apnea, unspecified: Secondary | ICD-10-CM | POA: Insufficient documentation

## 2011-12-11 DIAGNOSIS — I10 Essential (primary) hypertension: Secondary | ICD-10-CM | POA: Insufficient documentation

## 2011-12-11 DIAGNOSIS — R079 Chest pain, unspecified: Secondary | ICD-10-CM | POA: Insufficient documentation

## 2011-12-11 DIAGNOSIS — R0600 Dyspnea, unspecified: Secondary | ICD-10-CM

## 2011-12-11 DIAGNOSIS — R002 Palpitations: Secondary | ICD-10-CM | POA: Insufficient documentation

## 2011-12-11 LAB — POCT I-STAT, CHEM 8
BUN: 9 mg/dL (ref 6–23)
Calcium, Ion: 1.25 mmol/L (ref 1.12–1.32)
Chloride: 107 mEq/L (ref 96–112)
Creatinine, Ser: 0.6 mg/dL (ref 0.50–1.10)
Glucose, Bld: 115 mg/dL — ABNORMAL HIGH (ref 70–99)
HCT: 38 % (ref 36.0–46.0)
Hemoglobin: 12.9 g/dL (ref 12.0–15.0)
Potassium: 3.9 mEq/L (ref 3.5–5.1)
Sodium: 142 mEq/L (ref 135–145)
TCO2: 23 mmol/L (ref 0–100)

## 2011-12-11 LAB — TROPONIN I: Troponin I: <0.3 ng/mL (ref <0.30)

## 2011-12-11 NOTE — Discharge Instructions (Signed)
Cardiac Event Monitoring A cardiac event monitor is a small recording device used to help detect abnormal heart rhythms. When the heart does not beat in a stable or regular pattern, this is called an arrhythmia. An arrhythmia may or may not be felt. Palpitations, or fast heart beats, may be felt. A cardiac event monitor is used to record the heart rhythm when noticeable symptoms such as the following occur:  Palpitations, such as heart racing or fluttering.   Dizziness.   Fainting or lightheadedness.   Unexplained weakness.  The monitor is wired to two electrodes placed on your chest. Electrodes are flat sticky disks that attach to your skin. The monitor stores the heart rhythm and can be worn for up to 30 days. You will wear the monitor at all times, except when bathing. When you feel symptoms of heart trouble, such as dizziness, weakness, lightheadedness, palpitations, "thumping," shortness of breath or a fluttering or racing heart, you will push a button on the monitor to record the heart rhythm.  Your caregiver will also ask you to keep a diary of your activities, such as walking, doing chores and taking medicine. Be sure to note what you are doing when you experience heart symptoms. This will help your caregiver determine what might be contributing to your symptoms. The information stored in your monitor will be reviewed by your caregiver alongside your diary entries.  LET YOUR CAREGIVER KNOW ABOUT: Allergies to any kind of medical tape. PROCEDURE  A technician will prepare your chest for the electrode placement. The technician will show you how to place the electrodes, how to work the monitor, how to replace the batteries and how to fill in your diary. Take time to practice using the monitor before you leave the office. Make sure you understand how to send the information from the monitor to your doctor. This requires a telephone with a land line, not a cellphone.   If you start to feel  lightheaded, dizzy, weak or have fluttering or racing sensations in your heart, press the record button. The monitor is always on and records what happened slightly before you pressed the button, so do not worry about being too late to get good information.  HOME CARE INSTRUCTIONS   Wear your monitor at all times, except when you are in water:   Do not get the monitor wet.   Take the monitor off when bathing, do not swim or use a hot tub with it on.   Keep your skin clean, do not put body lotion or moisturizer on your chest.   It's possible that your skin under the electrodes could become irritated. To keep this from happening, try to put the electrodes in slightly different places on your chest. However, they must remain in the area under your left breast and in the upper right section of your chest.   Change the electrodes daily or any time they stop sticking to your skin. You might need to use tape to keep them on.   Make sure the monitor is safely clipped to your clothing or in a location close to your body that your caregiver recommends.   Keep a diary of your activities, especially what you were doing when you pushed the button to record your heart symptoms.   Change the batteries as recommended by your caregiver.   Send the recorded information as recommended by your caregiver. It is important to understand that your caregiver does not have instantaneous results when a  recording is made.  SEEK MEDICAL CARE IF:   Your doctor reviews your records and advises you to come to the office or go to the ER.   You have questions or concerns about the cardiac event monitor.  SEEK IMMEDIATE MEDICAL CARE IF:   You have chest pain.   You have difficulty breathing or shortness of breath.   You develop a very fast heartbeat or your heart "skips" beats.   You develop dizziness which lasts several minutes.   You faint or feel you are about to faint.  MAKE SURE YOU:  Understand these  instructions. Dyspnea Shortness of breath (dyspnea) is the feeling of uneasy breathing. Dyspnea should be evaluated promptly. DIAGNOSIS  Many tests may be done to find why you are having shortness of breath. Tests may include:  A chest X-ray.   A lung function test.   Blood tests.   Recordings of the electrical activity of the heart (electrocardiogram).   Exercise testing.   Sound wave images of the heart (a cardiac echocardiogram).   A scan.  A cause for your shortness of breath may not be identified initially. In this case, it is important to have a follow-up exam with your caregiver. HOME CARE INSTRUCTIONS   Do not smoke. Smoking is a common cause of shortness of breath. Ask for help to stop smoking.   Avoid being around chemicals that may bother your breathing, such as paint fumes or dust.   Rest as needed. Slowly begin your usual activities.   If medications were prescribed, take them as directed for the full length of time directed. This includes oxygen and any inhaled medications, if prescribed.   It is very important that you follow up with your caregiver or other physician as directed. Waiting to do so or failure to follow up could result in worsening of your condition, possible disability, or death.   Be sure you understand what to do or who to call if your shortness of breath worsens.  SEEK MEDICAL CARE IF:   Your condition does not improve in the time expected.   You have a hard time doing your normal activities even with rest.   You have any side effects from or problems with medications prescribed.  SEEK IMMEDIATE MEDICAL CARE IF:   You feel your shortness of breath is getting worse.   You feel lightheaded, faint or develop a cough not controlled with medications.   You start coughing up blood.   You get pain with breathing.   You get chest pain or pain in your arms, shoulders or belly (abdomen).   You have a fever.   You are unable to walk up stairs  or exercise the way you normally can.  MAKE SURE YOU:   Understand these instructions.   Will watch your condition.   Will get help right away if you are not doing well or get worse.  Document Released: 09/10/2004 Document Revised: 04/15/2011 Document Reviewed: 12/19/2009 [facility] Patient Information 2012 [place], [place].Palpitations  A palpitation is the feeling that your heartbeat is irregular or is faster than normal. Although this is frightening, it usually is not serious. Palpitations may be caused by excesses of smoking, caffeine, or alcohol. They are also brought on by stress and anxiety. Sometimes, they are caused by heart disease. Unless otherwise noted, your caregiver did not find any signs of serious illness at this time. HOME CARE INSTRUCTIONS  To help prevent palpitations: Drink decaffeinated coffee, tea, and soda pop. Avoid  chocolate.  If you smoke or drink alcohol, quit or cut down as much as possible.  Reduce your stress or anxiety level. Biofeedback, yoga, or meditation will help you relax. Physical activity such as swimming, jogging, or walking also may be helpful.  SEEK MEDICAL CARE IF:  You continue to have a fast heartbeat.  Your palpitations occur more often.  SEEK IMMEDIATE MEDICAL CARE IF: You develop chest pain, shortness of breath, severe headache, dizziness, or fainting. Document Released: 07/31/2000 Document Revised: 07/23/2011 Document Reviewed: 09/30/2007  [facility] Patient Information 2012 [place], [place].  Will watch your condition.   Will get help right away if you are not doing well or get worse.  Document Released: 05/12/2008 Document Revised: 07/23/2011 Document Reviewed: 05/12/2008 [facility] Patient Information 2012 [place], [place].

## 2011-12-11 NOTE — ED Notes (Signed)
Pt c/o Chest pain, sob, describes pain as tightness/pressure, pain originally 6/10, pt took ASA 324 MG and Ativan 0.5 mg PTA and given Nitro SL en route, pt reports pain 0/10, continues to report sob, 12 lead unremarkable, 18 g LAC, pt reports coming to our facility several times over the last 3 weeks.

## 2011-12-11 NOTE — ED Provider Notes (Signed)
History    2 female with dyspnea and palpitations. Currently resolved. Onset earlier this afternoon while laying down. This episode was associated with a sensation that someone was squeezing her chest. Last less than a minute. HAs hx of anxiety but says this feels different. Has been regularly checking her blood pressure and had a single reading in the 190s systolic which concerned her as well. This associated with the episode of palpitations is what prompted her to come the emergency room. Patient was recently diagnosed with pulmonary embolism. She is currently taking xarelto which she reports compliance with. No fever or chills. No n/v. Denies drug use. No significant caffeine intake. Hx of hyperthyroid and taking methimazole. Was supposed to see her endocrinologist recently but had to reschedule because of recent illness.  CSN: [id number]  Arrival date & time 12/11/11  1311   First MD Initiated Contact with Patient 12/11/11 1312      Chief Complaint  Patient presents with  . Chest Pain    (Consider location/radiation/quality/duration/timing/severity/associated sxs/prior treatment) HPI  Past Medical History  Diagnosis Date  . Tachycardia   . Hypertension   . Sickle cell trait   . Uveitis   . Angina   . Pulmonary embolism on left 12/02/11  . Sleep apnea   . Hyperthyroidism   . Headache   . Anxiety   . PTSD (post-traumatic stress disorder)   . SVT (supraventricular tachycardia)     Past Surgical History  Procedure Date  . Knee arthroscopy w/ internal fixation tibial spine fracture ~ 2011    right    Family History  Problem Relation Age of Onset  . Stroke Mother   . Cancer Mother   . Hypertension Mother   . Sarcoidosis Mother   . Stroke Father   . Hypertension Father   . Hyperlipidemia Father     History  Substance Use Topics  . Smoking status: Never Smoker   . Smokeless tobacco: Never Used  . Alcohol Use: Yes     12/02/11 "glass of wine maybe 3 times/month"     OB History    Grav Para Term Preterm Abortions TAB SAB Ect Mult Living                  Review of Systems   Review of symptoms negative unless otherwise noted in HPI.   Allergies  Review of patient's allergies indicates no known allergies.  Home Medications   Current Outpatient Rx  Name Route Sig Dispense Refill  . MAGIC MOUTHWASH Oral Take 10 mLs by mouth every 2 (two) hours. Swish and spit    . AMLODIPINE BESYLATE 10 MG PO TABS Oral Take 10 mg by mouth daily.    [name] LORAZEPAM 0.5 MG PO TABS Oral Take 0.5 mg by mouth daily as needed. For anxiety.    . METHIMAZOLE 20 MG PO TABS Oral Take 1 tablet (20 mg total) by mouth daily. 30 tablet 0  . METOPROLOL TARTRATE 25 MG PO TABS Oral Take 25 mg by mouth 2 (two) times daily.    [name] POLYVINYL ALCOHOL 1.4 % OP SOLN Both Eyes Place 2 drops into both eyes as needed. For dry eyes    . RIVAROXABAN 15 MG PO TABS Oral Take 1 tablet (15 mg total) by mouth 2 (two) times daily with a meal. 62 tablet 0    BP 136/83  Pulse 89  Temp(Src) 98.7 F (37.1 C) (Oral)  Resp 16  SpO2 100%  LMP 12/09/2011  Physical Exam  Nursing note and vitals reviewed. Constitutional: She appears well-developed and well-nourished. No distress.       Laying in bed. nad.  HENT:  Head: Normocephalic and atraumatic.  Eyes: Conjunctivae are normal. Right eye exhibits no discharge. Left eye exhibits no discharge.  Neck: Neck supple.  Cardiovascular: Normal rate, regular rhythm and normal heart sounds.  Exam reveals no gallop and no friction rub.   No murmur heard. Pulmonary/Chest: Effort normal and breath sounds normal. No respiratory distress.  Abdominal: Soft. She exhibits no distension. There is no tenderness.  Musculoskeletal: She exhibits no edema and no tenderness.       Lower extremities symmetric as compared to each other. No calf tenderness. Negative Homan's. No palpable cords.   Neurological: She is alert.  Skin: Skin is warm and dry. She is not  diaphoretic.  Psychiatric: She has a normal mood and affect. Her behavior is normal. Thought content normal.    ED Course  Procedures (including critical care time)  Labs Reviewed  POCT I-STAT, CHEM 8 - Abnormal; Notable for the following:    Glucose, Bld 115 (*)    All other components within normal limits  TROPONIN I   Ct Angio Chest W/cm &/or Wo Cm  12/10/2011  *RADIOLOGY REPORT*  Clinical Data: Chest pain and tachycardia beginning 20 minutes ago. Vaginal bleeding.  History of pulmonary embolus on Friday.  CT ANGIOGRAPHY CHEST  Technique:  Multidetector CT imaging of the chest using the standard protocol during bolus administration of intravenous contrast. Multiplanar reconstructed images including MIPs were obtained and reviewed to evaluate the vascular anatomy.  Contrast: OMNIPAQUE IOHEXOL 350 MG/ML SOLN  Comparison: 12/02/2011  Findings: Technically adequate study with moderately good opacification of the central and segmental pulmonary arteries.  No focal filling defects demonstrated.  No evidence of significant pulmonary embolus.  The left lower lobe pulmonary arteries are well opacified today without evidence of any significant residual pulmonary embolus.  Normal heart size.  Normal caliber thoracic aorta.  No significant lymphadenopathy in the chest.  Esophagus is decompressed.  The small esophageal hiatal hernia.  Visualized portions of the upper abdominal organs are unremarkable.  No pleural effusion.  No focal airspace consolidation in the lungs.  No significant interstitial infiltration.  Airways appear patent.  Normal alignment of the thoracic vertebrae.  IMPRESSION: No significant residual pulmonary embolus.  No evidence of active consolidation in the lungs.  Original Report Authenticated By: Marlon Pel, M.D.   EKG:  Rhythm: Normal sinus rhythm Rate: 91 Axis: Normal  Intervals: Anterior septal Q waves. ST segments: Nonspecific ST changes anteriorly and  laterally Comparison: Q waves noted on previous from 2d ago  1. Palpitations   2. Dyspnea       MDM  41 year old female with dyspnea, palpitations and chest pain. 3 normal troponins in past 36 hours and atypical symptoms of ACS. Similar symptoms recently with multiple evaluation for the same. Recently diagnosed with a pulmonary embolism and is on Xarelto which she reports compliance with. Patient was evaluated in emergency room 2 days ago and had a repeat CT angiogram which did not show an increased clot burden. Patient continues to have similar symptoms. Hypokalemia on last visit now normal. Palpitations may be from anxiety, hyperthyroidism which patient is being treated with methimazole, SVT which she appears to have a history of or her recent diagnosis of pulmonary embolism.  No sustained tachycardia, but pt did have less than a minute episode of sinus tach near 140 while  in the ED which resolved spontaneously with no intervention. Unsure of the significance of this. No repeat episodes.  EKG with normal intervals. No findings to suggest pre-excitation.  No respiratory distress on exam. Consider pheochromocytoma but rare diagnosis and no associated hypertension. Oxygen saturations are good on room air. Discussed with patient that she may benefit from a Holter monitor. Discussed that she needs to follow back up with her endocrinologist as well for further evaluation of her hyperthyroidism.         Raeford Razor, MD 12/11/11 1606

## 2011-12-15 ENCOUNTER — Inpatient Hospital Stay (HOSPITAL_COMMUNITY)
Admission: EM | Admit: 2011-12-15 | Discharge: 2011-12-20 | DRG: 143 | Disposition: A | Discharge status: Home / Self Care | Payer: BC Managed Care – PPO | Attending: Family Medicine | Admitting: Family Medicine

## 2011-12-15 ENCOUNTER — Emergency Department (HOSPITAL_COMMUNITY): Payer: BC Managed Care – PPO

## 2011-12-15 ENCOUNTER — Other Ambulatory Visit: Payer: Self-pay

## 2011-12-15 ENCOUNTER — Encounter (HOSPITAL_COMMUNITY): Payer: Self-pay | Admitting: *Deleted

## 2011-12-15 DIAGNOSIS — G459 Transient cerebral ischemic attack, unspecified: Secondary | ICD-10-CM

## 2011-12-15 DIAGNOSIS — R0789 Other chest pain: Principal | ICD-10-CM | POA: Diagnosis present

## 2011-12-15 DIAGNOSIS — I2699 Other pulmonary embolism without acute cor pulmonale: Secondary | ICD-10-CM

## 2011-12-15 DIAGNOSIS — R0600 Dyspnea, unspecified: Secondary | ICD-10-CM

## 2011-12-15 DIAGNOSIS — N920 Excessive and frequent menstruation with regular cycle: Secondary | ICD-10-CM | POA: Diagnosis present

## 2011-12-15 DIAGNOSIS — D509 Iron deficiency anemia, unspecified: Secondary | ICD-10-CM | POA: Diagnosis present

## 2011-12-15 DIAGNOSIS — I471 Supraventricular tachycardia, unspecified: Secondary | ICD-10-CM

## 2011-12-15 DIAGNOSIS — F411 Generalized anxiety disorder: Secondary | ICD-10-CM | POA: Diagnosis present

## 2011-12-15 DIAGNOSIS — R079 Chest pain, unspecified: Secondary | ICD-10-CM

## 2011-12-15 DIAGNOSIS — F4310 Post-traumatic stress disorder, unspecified: Secondary | ICD-10-CM | POA: Diagnosis present

## 2011-12-15 DIAGNOSIS — R9431 Abnormal electrocardiogram [ECG] [EKG]: Secondary | ICD-10-CM

## 2011-12-15 DIAGNOSIS — E876 Hypokalemia: Secondary | ICD-10-CM | POA: Diagnosis present

## 2011-12-15 DIAGNOSIS — G4730 Sleep apnea, unspecified: Secondary | ICD-10-CM | POA: Diagnosis present

## 2011-12-15 DIAGNOSIS — Z7901 Long term (current) use of anticoagulants: Secondary | ICD-10-CM

## 2011-12-15 DIAGNOSIS — I10 Essential (primary) hypertension: Secondary | ICD-10-CM

## 2011-12-15 DIAGNOSIS — E0590 Thyrotoxicosis, unspecified without thyrotoxic crisis or storm: Secondary | ICD-10-CM

## 2011-12-15 DIAGNOSIS — Z86711 Personal history of pulmonary embolism: Secondary | ICD-10-CM

## 2011-12-15 DIAGNOSIS — Z79899 Other long term (current) drug therapy: Secondary | ICD-10-CM

## 2011-12-15 DIAGNOSIS — I498 Other specified cardiac arrhythmias: Secondary | ICD-10-CM | POA: Diagnosis present

## 2011-12-15 HISTORY — DX: Abnormal electrocardiogram (ECG) (EKG): R94.31

## 2011-12-15 HISTORY — DX: Shortness of breath: R06.02

## 2011-12-15 LAB — DIFFERENTIAL
Basophils Relative: 0 % (ref 0–1)
Eosinophils Relative: 0 % (ref 0–5)
Lymphs Abs: 1.4 10*3/uL (ref 0.7–4.0)
Monocytes Relative: 6 % (ref 3–12)

## 2011-12-15 LAB — BASIC METABOLIC PANEL
CO2: 22 mEq/L (ref 19–32)
Chloride: 102 mEq/L (ref 96–112)
Creatinine, Ser: 0.68 mg/dL (ref 0.50–1.10)
Glucose, Bld: 141 mg/dL — ABNORMAL HIGH (ref 70–99)

## 2011-12-15 LAB — CBC
HCT: 37.9 % (ref 36.0–46.0)
Hemoglobin: 13.1 g/dL (ref 12.0–15.0)
MCH: 23.1 pg — ABNORMAL LOW (ref 26.0–34.0)
MCV: 67 fL — ABNORMAL LOW (ref 78.0–100.0)
Platelets: 327 10*3/uL (ref 150–400)
RBC: 5.66 MIL/uL — ABNORMAL HIGH (ref 3.87–5.11)
WBC: 6 10*3/uL (ref 4.0–10.5)

## 2011-12-15 LAB — T3, FREE: T3, Free: 4.4 pg/mL — ABNORMAL HIGH (ref 2.3–4.2)

## 2011-12-15 LAB — CARDIAC PANEL(CRET KIN+CKTOT+MB+TROPI)
Relative Index: INVALID (ref 0.0–2.5)
Troponin I: <0.3 ng/mL (ref <0.30)

## 2011-12-15 LAB — POCT I-STAT TROPONIN I: Troponin i, poc: 0 ng/mL (ref 0.00–0.08)

## 2011-12-15 MED ORDER — HYDROCODONE-ACETAMINOPHEN 5-325 MG PO TABS
1.0000 | ORAL_TABLET | ORAL | Status: DC | PRN
  Administered 2011-12-18: 1 via ORAL
  Filled 2011-12-15: qty 2

## 2011-12-15 MED ORDER — RIVAROXABAN 15 MG PO TABS
15.0000 mg | ORAL_TABLET | Freq: Two times a day (BID) | ORAL | Status: DC
  Administered 2011-12-15 – 2011-12-17 (×4): 15 mg via ORAL
  Filled 2011-12-15 (×7): qty 1

## 2011-12-15 MED ORDER — MAGNESIUM SULFATE 40 MG/ML IJ SOLN
2.0000 g | Freq: Once | INTRAMUSCULAR | Status: AC
  Administered 2011-12-15: 2 g via INTRAVENOUS
  Filled 2011-12-15: qty 50

## 2011-12-15 MED ORDER — ONDANSETRON HCL 4 MG/2ML IJ SOLN: 4.0000 mg | Freq: Four times a day (QID) | INTRAMUSCULAR | Status: DC | PRN

## 2011-12-15 MED ORDER — MORPHINE SULFATE 4 MG/ML IJ SOLN
4.0000 mg | Freq: Once | INTRAMUSCULAR | Status: DC
  Filled 2011-12-15 (×2): qty 1

## 2011-12-15 MED ORDER — MAGIC MOUTHWASH
10.0000 mL | ORAL | Status: DC
  Administered 2011-12-15 – 2011-12-20 (×36): 10 mL via ORAL
  Filled 2011-12-15 (×68): qty 10

## 2011-12-15 MED ORDER — AMLODIPINE BESYLATE 10 MG PO TABS
10.0000 mg | ORAL_TABLET | Freq: Every day | ORAL | Status: DC
  Administered 2011-12-17 – 2011-12-20 (×3): 10 mg via ORAL
  Filled 2011-12-15 (×5): qty 1

## 2011-12-15 MED ORDER — POLYVINYL ALCOHOL 1.4 % OP SOLN
2.0000 [drp] | OPHTHALMIC | Status: DC | PRN
  Filled 2011-12-15: qty 15

## 2011-12-15 MED ORDER — NITROGLYCERIN 0.4 MG SL SUBL
0.4000 mg | SUBLINGUAL_TABLET | SUBLINGUAL | Status: AC | PRN
  Administered 2011-12-15 – 2011-12-17 (×3): 0.4 mg via SUBLINGUAL
  Filled 2011-12-15: qty 25

## 2011-12-15 MED ORDER — ASPIRIN 81 MG PO CHEW
81.0000 mg | CHEWABLE_TABLET | Freq: Every day | ORAL | Status: DC
  Administered 2011-12-16 – 2011-12-20 (×5): 81 mg via ORAL
  Filled 2011-12-15 (×5): qty 1

## 2011-12-15 MED ORDER — GUAIFENESIN-DM 100-10 MG/5ML PO SYRP: 5.0000 mL | ORAL_SOLUTION | ORAL | Status: DC | PRN

## 2011-12-15 MED ORDER — LORAZEPAM 2 MG/ML IJ SOLN
0.5000 mg | Freq: Once | INTRAMUSCULAR | Status: AC
  Administered 2011-12-15: 0.5 mg via INTRAVENOUS
  Filled 2011-12-15: qty 1

## 2011-12-15 MED ORDER — ASPIRIN 81 MG PO CHEW
324.0000 mg | CHEWABLE_TABLET | Freq: Once | ORAL | Status: AC
  Administered 2011-12-15: 324 mg via ORAL
  Filled 2011-12-15: qty 4

## 2011-12-15 MED ORDER — SODIUM CHLORIDE 0.9 % IV BOLUS (SEPSIS)
1000.0000 mL | Freq: Once | INTRAVENOUS | Status: AC
  Administered 2011-12-15: 1000 mL via INTRAVENOUS

## 2011-12-15 MED ORDER — ALBUTEROL SULFATE (5 MG/ML) 0.5% IN NEBU: 2.5000 mg | INHALATION_SOLUTION | RESPIRATORY_TRACT | Status: DC | PRN

## 2011-12-15 MED ORDER — POTASSIUM CHLORIDE CRYS ER 20 MEQ PO TBCR
40.0000 meq | EXTENDED_RELEASE_TABLET | Freq: Once | ORAL | Status: AC
  Administered 2011-12-15: 40 meq via ORAL
  Filled 2011-12-15: qty 2

## 2011-12-15 MED ORDER — GI COCKTAIL ~~LOC~~
30.0000 mL | Freq: Once | ORAL | Status: AC
  Administered 2011-12-15: 30 mL via ORAL
  Filled 2011-12-15: qty 30

## 2011-12-15 MED ORDER — ONDANSETRON HCL 4 MG PO TABS: 4.0000 mg | ORAL_TABLET | Freq: Four times a day (QID) | ORAL | Status: DC | PRN

## 2011-12-15 MED ORDER — POTASSIUM CHLORIDE CRYS ER 20 MEQ PO TBCR: 40.0000 meq | EXTENDED_RELEASE_TABLET | Freq: Once | ORAL | Status: DC

## 2011-12-15 MED ORDER — LORAZEPAM 0.5 MG PO TABS
0.5000 mg | ORAL_TABLET | Freq: Four times a day (QID) | ORAL | Status: DC | PRN
  Administered 2011-12-16 – 2011-12-20 (×4): 0.5 mg via ORAL
  Filled 2011-12-15 (×5): qty 1

## 2011-12-15 MED ORDER — SODIUM CHLORIDE 0.9 % IJ SOLN
3.0000 mL | Freq: Two times a day (BID) | INTRAMUSCULAR | Status: DC
  Administered 2011-12-15 – 2011-12-19 (×8): 3 mL via INTRAVENOUS

## 2011-12-15 MED ORDER — SODIUM CHLORIDE 0.9 % IV SOLN
INTRAVENOUS | Status: AC
  Administered 2011-12-15: 15:00:00 via INTRAVENOUS

## 2011-12-15 MED ORDER — ALUM & MAG HYDROXIDE-SIMETH 200-200-20 MG/5ML PO SUSP
30.0000 mL | Freq: Once | ORAL | Status: DC
  Filled 2011-12-15: qty 30

## 2011-12-15 MED ORDER — METHIMAZOLE 10 MG PO TABS
20.0000 mg | ORAL_TABLET | Freq: Every day | ORAL | Status: DC
  Administered 2011-12-16 – 2011-12-17 (×2): 20 mg via ORAL
  Filled 2011-12-15 (×5): qty 2

## 2011-12-15 MED ORDER — METOPROLOL SUCCINATE ER 50 MG PO TB24
50.0000 mg | ORAL_TABLET | Freq: Every day | ORAL | Status: DC
  Filled 2011-12-15: qty 1

## 2011-12-15 NOTE — H&P (Addendum)
Patient Demographics  Jamie Barajas, is a 41 y.o. female  CSN: [id number]  MRN: [id number]  DOB - [birth year]  Admit Date - 12/15/2011  Outpatient Primary MD for the patient is Angelica Chessman., MD, MD   With History of -  Past Medical History  Diagnosis Date  . Tachycardia   . Hypertension   . Sickle cell trait   . Uveitis   . Angina   . Pulmonary embolism on left 12/02/11  . Sleep apnea   . Hyperthyroidism   . Headache   . Anxiety   . PTSD (post-traumatic stress disorder)   . SVT (supraventricular tachycardia)       Past Surgical History  Procedure Date  . Knee arthroscopy w/ internal fixation tibial spine fracture ~ 2011    right    in for   Chief Complaint  Patient presents with  . Chest Pain  . Shortness of Breath     HPI  Jamie Barajas  is a 41 y.o. female, was recently diagnosed with PE few weeks ago and placed on xaralto, she also has issues with hypothyroidism, history of supraventricular tachycardia and has seen Dr. Myrtis Ser cardiologist in the past. She now presents with two-day history of substernal pressure-like chest discomfort radiating to her neck, she has had 2-3 episodes of the same each lasting 5-10 minutes, episodes of urge with exertion better with rest, they are associated intermittently with palpitations, she does get little tired and fatigued when these happen. In the ER EKG shows inferior and lead T-wave inversion, negative troponin and x-ray and I was called to admit the patient.  Patient also says that since on xaralto her periods have been extremely heavy.   Review of Systems    In addition to the HPI above, No Fever-chills, No Headache, No changes with Vision or hearing, No problems swallowing food or Liquids, As above Chest pain, Cough or Shortness of Breath, No Abdominal pain, No Nausea or Vommitting, Bowel movements are regular, No Blood in stool or Urine, No dysuria, No new skin rashes or bruises, No new joints  pains-aches,  No new weakness, tingling, numbness in any extremity, No recent weight gain or loss, No polyuria, polydypsia or polyphagia, No significant Mental Stressors.  A full 10 point Review of Systems was done, except as stated above, all other Review of Systems were negative.   Social History History  Substance Use Topics  . Smoking status: Never Smoker   . Smokeless tobacco: Never Used  . Alcohol Use: Yes     12/02/11 "glass of wine maybe 3 times/month"     Family History Family History  Problem Relation Age of Onset  . Stroke Mother   . Cancer Mother   . Hypertension Mother   . Sarcoidosis Mother   . Stroke Father   . Hypertension Father   . Hyperlipidemia Father      Prior to Admission medications   Medication Sig Start Date End Date Taking? Authorizing Provider  Alum & Mag Hydroxide-Simeth (MAGIC MOUTHWASH) SOLN Take 10 mLs by mouth every 2 (two) hours. Swish and spit   Yes Historical Provider, MD  amLODipine (NORVASC) 10 MG tablet Take 10 mg by mouth daily.   Yes Historical Provider, MD  LORazepam (ATIVAN) 0.5 MG tablet Take 0.25-0.5 mg by mouth daily as needed. For anxiety.   Yes Historical Provider, MD  methimazole (TAPAZOLE) 10 MG tablet Take 20 mg by mouth daily.   Yes Historical Provider, MD  metoprolol succinate (TOPROL-XL) 25 MG  24 hr tablet Take 25 mg by mouth See admin instructions. Take 1 tablet in the morning, in the evening and at bedtime   Yes Historical Provider, MD  polyvinyl alcohol (LIQUIFILM TEARS) 1.4 % ophthalmic solution Place 2 drops into both eyes as needed. For dry eyes   Yes Historical Provider, MD  Rivaroxaban (XARELTO) 15 MG TABS tablet Take 1 tablet (15 mg total) by mouth 2 (two) times daily with a meal. 12/04/11  Yes Rodolph Bong, MD    No Known Allergies  Physical Exam  Vitals  Blood pressure 141/76, pulse 109, temperature 97.9 F (36.6 C), temperature source Oral, resp. rate 25, last menstrual period 12/09/2011, SpO2  99.00%.   1. General middle-aged African American female lying in bed in NAD,    2. Normal affect and insight, Not Suicidal or Homicidal, Awake Alert, Oriented *3.  3. No F.N deficits, ALL C.Nerves Intact, Strength 5/5 all 4 extremities, Sensation intact all 4 extremities, Plantars down going.  4. Ears and Eyes appear Normal, Conjunctivae clear, PERRLA. Moist Oral Mucosa.  5. Supple Neck, No JVD, No cervical lymphadenopathy appriciated, No Carotid Bruits.  6. Symmetrical Chest wall movement, Good air movement bilaterally, CTAB.  7. RRR, No Gallops, Rubs or Murmurs, No Parasternal Heave.  8. Positive Bowel Sounds, Abdomen Soft, Non tender, No organomegaly appriciated,No rebound -guarding or rigidity.  9.  No Cyanosis, Normal Skin Turgor, No Skin Rash or Bruise.  10. Good muscle tone,  joints appear normal , no effusions, Normal ROM.  11. No Palpable Lymph Nodes in Neck or Axillae    Data Review  CBC  Lab 12/15/11 1322 12/11/11 1345 12/09/11 2348 12/09/11 2324  WBC 6.0 -- -- 5.6  HGB 13.1 12.9 12.6 11.9*  HCT 37.9 38.0 37.0 34.5*  PLT 327 -- -- 192  MCV 67.0* -- -- 66.5*  MCH 23.1* -- -- 22.9*  MCHC 34.6 -- -- 34.5  RDW 14.4 -- -- 14.3  LYMPHSABS 1.4 -- -- 1.2  MONOABS 0.4 -- -- 0.4  EOSABS 0.0 -- -- 0.0  BASOSABS 0.0 -- -- 0.0  BANDABS -- -- -- --   ------------------------------------------------------------------------------------------------------------------ Chemistries   Lab 12/15/11 1322 12/11/11 1345 12/09/11 2348  NA 138 142 142  K 3.2* 3.9 2.9*  CL 102 107 106  CO2 22 -- --  GLUCOSE 141* 115* 135*  BUN 9 9 14   CREATININE 0.68 0.60 0.70  CALCIUM 9.7 -- --  MG -- -- --  AST -- -- --  ALT -- -- --  ALKPHOS -- -- --  BILITOT -- -- --   ------------------------------------------------------------------------------------------------------------------ CrCl is unknown because both a height and weight (above a minimum accepted value) are required for  this calculation. ------------------------------------------------------------------------------------------------------------------ No results found for this basename: TSH,T4TOTAL,FREET3,T3FREE,THYROIDAB in the last 72 hours  Coagulation profile No results found for this basename: INR:5,PROTIME:5 in the last 168 hours ------------------------------------------------------------------------------------------------------------------- No results found for this basename: DDIMER:2 in the last 72 hours ------------------------------------------------------------------------------------------------------------------- Cardiac Enzymes  Lab 12/11/11 1338  CKMB --  TROPONINI <0.30  MYOGLOBIN --   ------------------------------------------------------------------------------------------------------------------ No components found with this basename: POCBNP:3 ------------------------------------------------------------------------------------------------------------------  Imaging results:   Dg Chest 2 View  12/15/2011  *RADIOLOGY REPORT*  Clinical Data: Chest pain.  CHEST - 2 VIEW  Comparison: 12/02/2011  Findings: Heart and mediastinal contours are within normal limits. No focal opacities or effusions.  No acute bony abnormality.  IMPRESSION: No active cardiopulmonary disease.  Original Report Authenticated By: Cyndie Chime, M.D.    My  personal review of EKG: Rhythm NSR, Rate 103 /min, QTc 589 ,inverted T waves in inferior leads     Assessment & Plan  1. Chest pain- with some EKG changes in inferior leads, patient will be kept in the hospital for 23 hour observation, serial cardiac enzymes, she is already on Cymbalta that will be continued, will her on it 1 mg aspirin for tomorrow today she is already received full dose aspirin in the ER, beta blocker dose will be doubled but the heart rate and blood pressure control, we'll check a lipid panel in the morning, we'll request cardiology to see  the patient, I think patient will need echogram plus minus reversible ischemia testing based on cardiologist in evaluation.   2. Prolonged QTC 2 g magnesium will be given, telemetry monitor, double beta blocker.   3. Tachycardia could be due to underlying anxiety and hypothyroidism, double beta blocker dose, check TSH free T3 and free T4.   4. History of hypothyroidism will check TSH free T3 and T4, continue methimazole, outpatient endocrinology followup.   5. Extremely heavy periods since patient comes in although, H&H is stable, patient will need to see GYN physician as outpatient as this problem will recur again and again while she is on Xaralto. Tried calling the gynecologist on call Dr.Tavvon on his provided number on call schedule [phone], to discuss whether placing the patient on a contraceptive pill would be safe, however I have not heard back from him have paged x2.   6. Recent history of PE patient on xaralto will continue no acute issues, and is to see hematologist as outpatient will ask her to keep that appointment. This PE was brought on by a long several hour drive to [place] she was never taking any contraceptive pills and she is a nonsmoker.   7. Hypokalemia potassium has been replaced will check again in the morning.   DVT Prophylaxis XARALTO  AM Labs Ordered, also please review Full Orders  Admission, patients condition and plan of care including tests being ordered have been discussed with the patient and husband who indicate understanding and agree with the plan and Code Status.  Code Status FULL  Condition Marinell Blight K M.D on 12/15/2011 at 3:35 PM  [facility] Office  [phone]

## 2011-12-15 NOTE — ED Notes (Signed)
Patient claimed that her chest pain is down to 4/10 but could feel her heart pounding.

## 2011-12-15 NOTE — Consult Note (Signed)
CARDIOLOGY CONSULT NOTE  Patient ID: Jamie Barajas, MRN: [id number], DOB/AGE: May 19, 1971 41 y.o. Admit date: 12/15/2011 Date of Consult: 12/15/2011  Primary Physician: Angelica Chessman., MD Primary Cardiologist: Dr. Myrtis Ser in 2010  Chief Complaint: Chest pain Reason for Consultation: Chest pain  HPI: 41 y.o. female w/ PMHx significant for HTN, PE, hyperthyroidism, SVT, Anxiety/PTSD who presented to [facility] on 12/15/2011 with complaints of chest pain.  She reports having SVT that started in Sept 2011 for which she has been treated with adenosine and beta blockers. She has intermittent palpitations and has had about 8-9 episodes requiring evaluation. She wore an event monitor about 2 years ago but can't remember the results. She had a stress echo in 2010 that was normal. She was recently diagnosed with a left-sided PE on 12/02/11 after a long car ride and was placed on xarelto. Repeat CTA chest on 12/10/11 revealed no significant residual clot. She reports taking xarelto and other medications as prescribed. This morning around 8:00 had an episode of sob, she sat down and felt palpitations and took her BB. The sob and palpitations resolved with rest, but about later she had onset of chest pain across her chest that felt like a pulling sensation. It lasted about 15-20 mins and was a/w sob. No diaphoresis, dizziness, or nausea. It did not feel the same as when she had the PE. It is not worse with deep inspiration or movement. She saw her endocrinologist today who told her to come to the hospital for evaluation. Prior to the PE she was fairly active and was able to exercise on the treadmill without chest pain. She has occasional palpitations associated with sob. Has had a heavy menstrual cycle since starting the xarelto. Denies edema, orthopnea, PND, fever, chills, abd pain, melena, hematochezia, nausea, vomiting, syncope.  EKG shows sinus tachycardia 102-125bpm w/ QTc 515, TWI inferior  leads. CXR without acute cardiopulmonary findings. Labs significant for normal poc troponin, K+ 3.2, H&H 13.1/37.9, and WBC 6.0. She is being admitted by medicine who increased her BB, gave her Mg & K+ supplementation, and asked Cardiology to assist in evaluation of chest pain.   Past Medical History  Diagnosis Date  . Tachycardia   . Hypertension   . Sickle cell trait   . Uveitis   . Angina   . Pulmonary embolism on left 12/02/11  . Sleep apnea   . Hyperthyroidism   . Headache   . Anxiety   . PTSD (post-traumatic stress disorder)   . SVT (supraventricular tachycardia)      05/09/09 - Stress Echo Impressions: There was no chest pain or EKG change with stress. The echo images at rest reveal normal wall motion with an EF of 60%. With stress there is increase in thickening and contractility in all segments. There is decrease in cavity size in all views. This is a normal stress echo study. There is no ischemia.   Surgical History:  Past Surgical History  Procedure Date  . Knee arthroscopy w/ internal fixation tibial spine fracture ~ 2011    right     Home Meds: Medication Sig  Alum & Mag Hydroxide-Simeth (MAGIC MOUTHWASH) SOLN Take 10 mLs by mouth every 2 (two) hours. Swish and spit  amLODipine (NORVASC) 10 MG tablet Take 10 mg by mouth daily.  LORazepam (ATIVAN) 0.5 MG tablet Take 0.25-0.5 mg by mouth daily as needed. For anxiety.  methimazole (TAPAZOLE) 10 MG tablet Take 20 mg by mouth daily.  metoprolol succinate (TOPROL-XL) 25  MG 24 hr tablet Take 25 mg by mouth See admin instructions. Take 1 tablet in the morning, in the evening and at bedtime  polyvinyl alcohol (LIQUIFILM TEARS) 1.4 % ophthalmic solution Place 2 drops into both eyes as needed. For dry eyes  Rivaroxaban (XARELTO) 15 MG TABS tablet Take 1 tablet (15 mg total) by mouth 2 (two) times daily with a meal.   Inpatient Medications:   . sodium chloride   Intravenous STAT  . aspirin  324 mg Oral Once  . gi cocktail  30  mL Oral Once  . LORazepam  0.5 mg Intravenous Once  . magnesium sulfate 1 - 4 g bolus IVPB  2 g Intravenous Once  .  morphine injection  4 mg Intravenous Once  . potassium chloride  40 mEq Oral Once  . sodium chloride  1,000 mL Intravenous Once    Allergies: No Known Allergies  Social History  . Marital Status: Married   Occupational History  . Medical disability   Social History Main Topics  . Smoking status: Never Smoker   . Smokeless tobacco: Never Used  . Alcohol Use: Yes     12/02/11 "glass of wine maybe 3 times/month"  . Drug Use: No  . Sexually Active: Yes   Social History Narrative   Lives with husband     Family History  Problem Relation Age of Onset  . Stroke Mother   . Cancer Mother   . Hypertension Mother   . Sarcoidosis Mother   . Stroke Father   . Hypertension Father   . Hyperlipidemia Father      Review of Systems: General: negative for chills, fever, night sweats or weight changes.  Cardiovascular: As per HPI Dermatological: negative for rash Respiratory: negative for cough or wheezing Urologic: negative for hematuria Abdominal: negative for nausea, vomiting, diarrhea, bright red blood per rectum, melena, or hematemesis Neurologic: negative for visual changes, syncope, or dizziness All other systems reviewed and are otherwise negative except as noted above.  Labs:  Component Value Date   WBC 6.0 12/15/2011   HGB 13.1 12/15/2011   HCT 37.9 12/15/2011   MCV 67.0* 12/15/2011   PLT 327 12/15/2011    Lab 12/15/11 1322  NA 138  K 3.2*  CL 102  CO2 22  BUN 9  CREATININE 0.68  CALCIUM 9.7  GLUCOSE 141*     12/15/2011 13:33  Troponin i, poc 0.00    Radiology/Studies:   12/15/2011 - CXR Findings: Heart and mediastinal contours are within normal limits. No focal opacities or effusions.  No acute bony abnormality.  IMPRESSION: No active cardiopulmonary disease.    12/10/2011 - CTA Chest Findings: Technically adequate study with moderately good  opacification of the central and segmental pulmonary arteries.  No focal filling defects demonstrated.  No evidence of significant pulmonary embolus.  The left lower lobe pulmonary arteries are well opacified today without evidence of any significant residual pulmonary embolus.  Normal heart size.  Normal caliber thoracic aorta.  No significant lymphadenopathy in the chest.  Esophagus is decompressed.  The small esophageal hiatal hernia.  Visualized portions of the upper abdominal organs are unremarkable.  No pleural effusion.  No focal airspace consolidation in the lungs.  No significant interstitial infiltration.  Airways appear patent.  Normal alignment of the thoracic vertebrae.  IMPRESSION: No significant residual pulmonary embolus.  No evidence of active consolidation in the lungs.    12/02/2011  - CTA Chest Findings: Aorta normal caliber without aneurysm or dissection.  Respiratory motion artifacts slightly degrade assessment of lower lobe pulmonary arteries. However, an irregular filling defect is identified in a left lower lobe pulmonary artery consistent with pulmonary embolism. No other pulmonary emboli are visualized. No thoracic adenopathy. Visualized portion of upper abdomen normal appearance. Lungs clear. No pleural effusion or pneumothorax. No acute osseous findings.  IMPRESSION: Irregular filling defect in a left lower lobe pulmonary artery consistent with pulmonary embolism. No other definite intrathoracic abnormalities identified.    EKG: 12/15/11 @ 1245 - Sinus tachycardia 125bpm, TWI II, III, aVF, QTc 515  Physical Exam: Blood pressure 141/76, pulse 109, temperature 97.9 F (36.6 C), temperature source Oral, resp. rate 25, last menstrual period 12/09/2011, SpO2 99.00%. General: Well developed, well nourished, middle-aged black female, in no acute distress. Head: Normocephalic, atraumatic, sclera non-icteric, no xanthomas, nares are without discharge.  Neck: Supple. Negative for carotid  bruits. JVD not elevated. Lungs: Clear bilaterally to auscultation without wheezes, rales, or rhonchi. Breathing is unlabored. Heart: RRR with S1 S2. No murmurs, rubs, or gallops appreciated. Abdomen: Soft, non-tender, non-distended with normoactive bowel sounds. No hepatomegaly. No rebound/guarding. No obvious abdominal masses. Msk:  Strength and tone appear normal for age. Extremities: No clubbing or cyanosis. No edema.  Distal pedal pulses are intact and equal bilaterally. Neuro: Alert and oriented X 3. Moves all extremities spontaneously. Psych:  Responds to questions appropriately with a normal affect.   Assessment and Plan:   41 y.o. female w/ PMHx significant for HTN, PE, hyperthyroidism, SVT, Anxiety/PTSD who presented to [facility] on 12/15/2011 with complaints of chest pain.  1. Chest Pain: She presents with atypical chest pain that occurred this morning. Initial troponin normal. CXR w/o acute cardiopulmonary findings. EKG shows sinus tachycardia with TWI inferior leads, QTc 515. She has baseline nonspecific ST/T changes which is more pronounced with tachycardia. She is currently chest pain free. Has h/o recent left-sided PE which was essentially resolved by CTA chest on 12/10/11 on xarelto. This pain feels different than any pain she has felt before. Stress echo 2010 was normal. She has h/o SVT and has occ palpitations, but this episode feels different. Doubt ACS. Will check cardiac enzymes, if normal will consider outpatient exercise stress. Also check orthostatics. Avoid QT prolonging medications.  See MD note for further assessment and plan.  Signed, HOPE, JESSICA PA-C 12/15/2011, 4:10 PM  History reviewed with the patient, no changes to be made.  She presents with chest pain that has typical greater than atypical features for an ischemic etiology.  She does have a recent diagnosis and is on appropriate treatment for pulmonary emboli.  EKG shows nonspecific inferior T wave  changes that are somewhat different than previous most likely related to increased rate  The patient exam reveals Lungs:  Clear, COR:  RRR, no rub, Abd:  Positive bowel sounds, no rebound no guarding, Ext:  No calf tenderness, no swelling.  .  All available labs, radiology testing, previous records reviewed. Agree with documented assessment and plan.  At this point I think that the probability of obstructive CAD as the etiology of her complaints is quite low.  She has had a negative ischemia work up in the past.  Cycle enzymes.  However, if these are normal I would not suggest further inpatient work up.  Of note the patient does have a prolonged QTc but no syncope.  There are no medications that she is taking that should be contributing to this.  She should be educated about this and avoid  QT prolonging drugs in the future.  Repeat EKG in am.  Rollene Rotunda  5:56 PM 07/03/2011

## 2011-12-15 NOTE — ED Provider Notes (Signed)
History     CSN: [id number]  Arrival date & time 12/15/11  1245   First MD Initiated Contact with Patient 12/15/11 1253      Chief Complaint  Patient presents with  . Chest Pain  . Shortness of Breath    (Consider location/radiation/quality/duration/timing/severity/associated sxs/prior treatment) HPI History provided by pt and prior chart.  Per prior chart, pt presented to ED w/ c/o sudden onset chest tightness, palpitations and mild SOB on 12/02/11.  Was admitted for left-sided PE and d/c'd home on xarelto which she has been compliant with.  Woke this morning w/ discomfort at right sternal border that occasionally radiated below right breast as well as into her throat.  Associated w/ mild SOB and palpitations.  Took her morning metoprolol and sx improved temporarily.  Sx returned later this morning and chest discomfort has been constant since.  Has had some tightness and some burning sensation.  Pain feels different than PE because that was a squeezing sensation.  No h/o acid reflux.  Father had an MI at age 83.  Does not smoke cigarettes.    Past Medical History  Diagnosis Date  . Tachycardia   . Hypertension   . Sickle cell trait   . Uveitis   . Angina   . Pulmonary embolism on left 12/02/11  . Sleep apnea   . Hyperthyroidism   . Headache   . Anxiety   . PTSD (post-traumatic stress disorder)   . SVT (supraventricular tachycardia)     Past Surgical History  Procedure Date  . Knee arthroscopy w/ internal fixation tibial spine fracture ~ 2011    right    Family History  Problem Relation Age of Onset  . Stroke Mother   . Cancer Mother   . Hypertension Mother   . Sarcoidosis Mother   . Stroke Father   . Hypertension Father   . Hyperlipidemia Father     History  Substance Use Topics  . Smoking status: Never Smoker   . Smokeless tobacco: Never Used  . Alcohol Use: Yes     12/02/11 "glass of wine maybe 3 times/month"    OB History    Grav Para Term Preterm  Abortions TAB SAB Ect Mult Living                  Review of Systems  All other systems reviewed and are negative.    Allergies  Review of patient's allergies indicates no known allergies.  Home Medications   Current Outpatient Rx  Name Route Sig Dispense Refill  . MAGIC MOUTHWASH Oral Take 10 mLs by mouth every 2 (two) hours. Swish and spit    . AMLODIPINE BESYLATE 10 MG PO TABS Oral Take 10 mg by mouth daily.    [name] LORAZEPAM 0.5 MG PO TABS Oral Take 0.5 mg by mouth daily as needed. For anxiety.    . METHIMAZOLE 20 MG PO TABS Oral Take 1 tablet (20 mg total) by mouth daily. 30 tablet 0  . METOPROLOL TARTRATE 25 MG PO TABS Oral Take 25 mg by mouth 2 (two) times daily.    [name] POLYVINYL ALCOHOL 1.4 % OP SOLN Both Eyes Place 2 drops into both eyes as needed. For dry eyes    . RIVAROXABAN 15 MG PO TABS Oral Take 1 tablet (15 mg total) by mouth 2 (two) times daily with a meal. 62 tablet 0    BP 173/90  Pulse 131  Temp(Src) 97.9 F (36.6 C) (Oral)  Resp 22  SpO2 100%  LMP 12/09/2011  Physical Exam  Nursing note and vitals reviewed. Constitutional: She is oriented to person, place, and time. She appears well-developed and well-nourished. No distress.  HENT:  Head: Normocephalic and atraumatic.  Eyes:       Normal appearance  Neck: Normal range of motion.  Cardiovascular: Normal rate, regular rhythm and intact distal pulses.   Pulmonary/Chest: Effort normal and breath sounds normal. No respiratory distress.       Tenderness of sternum at at RSB.  No pleuritic pain reported  Abdominal: Soft. Bowel sounds are normal. There is no tenderness. There is no guarding.       Mild tenderness epigastrium as well as across lower abd (pt attributes to menses)  Musculoskeletal: Normal range of motion.       No peripheral edema or calf tenderness  Neurological: She is alert and oriented to person, place, and time.  Skin: Skin is warm and dry. No rash noted.  Psychiatric: She has a normal  mood and affect. Her behavior is normal.    ED Course  Procedures (including critical care time)   Date: 12/15/2011  Rate: 125   Rhythm: sinus tachycardia  QRS Axis: normal  Intervals: normal  ST/T Wave abnormalities: normal  Conduction Disutrbances:none  Narrative Interpretation:   Old EKG Reviewed: unchanged    Labs Reviewed  CBC - Abnormal; Notable for the following:    RBC 5.66 (*)    MCV 67.0 (*)    MCH 23.1 (*)    All other components within normal limits  BASIC METABOLIC PANEL - Abnormal; Notable for the following:    Potassium 3.2 (*)    Glucose, Bld 141 (*)    All other components within normal limits  DIFFERENTIAL  POCT I-STAT TROPONIN I  CARDIAC PANEL(CRET KIN+CKTOT+MB+TROPI)  TSH  T4, FREE  T3, FREE   Dg Chest 2 View  12/15/2011  *RADIOLOGY REPORT*  Clinical Data: Chest pain.  CHEST - 2 VIEW  Comparison: 12/02/2011  Findings: Heart and mediastinal contours are within normal limits. No focal opacities or effusions.  No acute bony abnormality.  IMPRESSION: No active cardiopulmonary disease.  Original Report Authenticated By: Cyndie Chime, M.D.     1. Chest pain       MDM  [phone] F w/ h/o SVT and HTN and admission 12 days ago for left lower lobe PE, presents w/ c/o waking w/ pain at RSB w/ SOB and palpitations.  She is currently on xarelto.  On exam, afebrile, tachycardic, lungs clear, CP reproducible w/ palpation, epigastric ttp, no LE tenderness/edema.  Continues to have chest discomfort currently after receiving a GI cocktail and refuses morphine.  Pain and SOB worsened when she walked to bathroom and nursing staff reports that HR increased from 98 at rest to 140 w/ ambulation. Will try SL ntg for pain.  EKG shows flipped t waves in inferior leads which is new from recent admission.  Troponin neg and labs otherwise unremarkable.  [facility] consulted for re-admission for further evaluation of pain.          Jamie Barajas, [place] 12/15/11 [phone]

## 2011-12-15 NOTE — ED Notes (Signed)
Pt is here with onset of chest pain and sob this am.  Pt reports recent diagnosis of PE on left side and is on Xralto blood thinner.  Pt reports the chest pain feels more like discomfort.  Hx of Svt and hr increased to 130s while performing EKG

## 2011-12-15 NOTE — ED Notes (Signed)
Walked PT O2 stayed at 100%. PT did get dizzy. HR went to 144.

## 2011-12-16 DIAGNOSIS — E876 Hypokalemia: Secondary | ICD-10-CM

## 2011-12-16 DIAGNOSIS — R079 Chest pain, unspecified: Secondary | ICD-10-CM

## 2011-12-16 DIAGNOSIS — I4581 Long QT syndrome: Secondary | ICD-10-CM

## 2011-12-16 DIAGNOSIS — I10 Essential (primary) hypertension: Secondary | ICD-10-CM

## 2011-12-16 DIAGNOSIS — R072 Precordial pain: Secondary | ICD-10-CM

## 2011-12-16 LAB — BASIC METABOLIC PANEL
CO2: 24 mEq/L (ref 19–32)
Calcium: 9 mg/dL (ref 8.4–10.5)
Chloride: 108 mEq/L (ref 96–112)
Potassium: 4.3 mEq/L (ref 3.5–5.1)
Sodium: 141 mEq/L (ref 135–145)

## 2011-12-16 LAB — CARDIAC PANEL(CRET KIN+CKTOT+MB+TROPI)
Troponin I: <0.3 ng/mL (ref <0.30)
Troponin I: <0.3 ng/mL (ref <0.30)

## 2011-12-16 LAB — CBC
HCT: 32.9 % — ABNORMAL LOW (ref 36.0–46.0)
MCV: 67.6 fL — ABNORMAL LOW (ref 78.0–100.0)
Platelets: 267 10*3/uL (ref 150–400)
RBC: 4.87 MIL/uL (ref 3.87–5.11)
WBC: 5.2 10*3/uL (ref 4.0–10.5)

## 2011-12-16 LAB — LIPID PANEL
Cholesterol: 100 mg/dL (ref 0–200)
VLDL: 24 mg/dL (ref 0–40)

## 2011-12-16 MED ORDER — METOPROLOL SUCCINATE ER 50 MG PO TB24
50.0000 mg | ORAL_TABLET | Freq: Two times a day (BID) | ORAL | Status: DC
  Administered 2011-12-16 – 2011-12-20 (×8): 50 mg via ORAL
  Filled 2011-12-16 (×10): qty 1

## 2011-12-16 MED ORDER — METOPROLOL SUCCINATE ER 50 MG PO TB24
50.0000 mg | ORAL_TABLET | Freq: Two times a day (BID) | ORAL | Status: DC
  Administered 2011-12-16: 50 mg via ORAL
  Filled 2011-12-16 (×3): qty 1

## 2011-12-16 NOTE — Progress Notes (Signed)
   CARE MANAGEMENT NOTE 12/16/2011  Patient:  KIERNAN, FARKAS   Account Number:  [id number]  Date Initiated:  12/16/2011  Documentation initiated by:  Letha Cape  Subjective/Objective Assessment:   dx htn  admit- lives with spouse and childrern.  pta independent.     Action/Plan:   Anticipated DC Date:  12/17/2011   Anticipated DC Plan:  HOME/SELF CARE      DC Planning Services  CM consult      Choice offered to / List presented to:             Status of service:  In process, will continue to follow Medicare Important Message given?   (If response is "NO", the following Medicare IM given date fields will be blank) Date Medicare IM given:   Date Additional Medicare IM given:    Discharge Disposition:    Per UR Regulation:    If discussed at Long Length of Stay Meetings, dates discussed:    Comments:  PCP Aguiare, Rafela  12/16/11 12:11 Letha Cape RN, BSN [phone] patient lives with spouse and children, pta independent, patient has medication coverage and transportation.  NCM will continue to follow for dc needs.   Patient for stress test tomorrow and echo .

## 2011-12-16 NOTE — Progress Notes (Signed)
Patient ID: Jamie Barajas, female   DOB: May 26, 1971, 41 y.o.   MRN: [id number]   SUBJECTIVE:  The patient continues to feel poorly. She is weak. She has marked increase in heart rate with limited ambulation. We know from a very recent admission that she is hyperthyroid. She is on Tapazole and a beta blocker. I will increase the beta blocker dose. However further evaluation to decide if more Tapazole as needed would be appropriate. She is not getting adequate rate control. She continues to have some intermittent chest pain. Etiology remains unclear.   Filed Vitals:   12/15/11 1803 12/15/11 1806 12/15/11 2008 12/16/11 0444  BP: 132/84 129/90 121/84 111/74  Pulse: 87 107 82 85  Temp:   98.1 F (36.7 C) 98.3 F (36.8 C)  TempSrc:   Oral Oral  Resp:   18 18  Height:      Weight:    173 lb 14.4 oz (78.881 kg)  SpO2: 99% 99% 97% 99%    Intake/Output Summary (Last 24 hours) at 12/16/11 0849 Last data filed at 12/16/11 0700  Gross per 24 hour  Intake    240 ml  Output      0 ml  Net    240 ml    LABS: Basic Metabolic Panel:  Basename 12/16/11 0535 12/15/11 1322  NA 141 138  K 4.3 3.2*  CL 108 102  CO2 24 22  GLUCOSE 93 141*  BUN 7 9  CREATININE 0.76 0.68  CALCIUM 9.0 9.7  MG -- --  PHOS -- --   Liver Function Tests: No results found for this basename: AST:2,ALT:2,ALKPHOS:2,BILITOT:2,PROT:2,ALBUMIN:2 in the last 72 hours No results found for this basename: LIPASE:2,AMYLASE:2 in the last 72 hours CBC:  Basename 12/16/11 0535 12/15/11 1322  WBC 5.2 6.0  NEUTROABS -- 4.2  HGB 11.0* 13.1  HCT 32.9* 37.9  MCV 67.6* 67.0*  PLT 267 327   Cardiac Enzymes:  Basename 12/16/11 0105 12/15/11 1713  CKTOTAL 60 62  CKMB 1.2 1.2  CKMBINDEX -- --  TROPONINI <0.30 <0.30   BNP: No components found with this basename: POCBNP:3 D-Dimer: No results found for this basename: DDIMER:2 in the last 72 hours Hemoglobin A1C: No results found for this basename: HGBA1C in the last 72  hours Fasting Lipid Panel:  Basename 12/16/11 0535  CHOL 100  HDL 23*  LDLCALC 53  TRIG 409  CHOLHDL 4.3  LDLDIRECT --   Thyroid Function Tests:  Basename 12/15/11 1544  TSH 0.010*  T4TOTAL --  T3FREE 4.4*  THYROIDAB --    RADIOLOGY: Dg Chest 2 View  12/15/2011  *RADIOLOGY REPORT*  Clinical Data: Chest pain.  CHEST - 2 VIEW  Comparison: 12/02/2011  Findings: Heart and mediastinal contours are within normal limits. No focal opacities or effusions.  No acute bony abnormality.  IMPRESSION: No active cardiopulmonary disease.  Original Report Authenticated By: Cyndie Chime, M.D.   Dg Chest 2 View  12/02/2011  *RADIOLOGY REPORT*  Clinical Data: Chest pain, shortness of breath, left arm numbness  CHEST - 2 VIEW  Comparison: Chest x-ray of 09/17/2009  Findings: The lungs are clear.  Mediastinal contours appear normal. The heart is within normal limits in size.  No bony abnormality is seen.  IMPRESSION: No active lung disease.  Original Report Authenticated By: Juline Patch, M.D.   Ct Angio Chest W/cm &/or Wo Cm  12/10/2011  *RADIOLOGY REPORT*  Clinical Data: Chest pain and tachycardia beginning 20 minutes ago. Vaginal bleeding.  History of  pulmonary embolus on Friday.  CT ANGIOGRAPHY CHEST  Technique:  Multidetector CT imaging of the chest using the standard protocol during bolus administration of intravenous contrast. Multiplanar reconstructed images including MIPs were obtained and reviewed to evaluate the vascular anatomy.  Contrast: OMNIPAQUE IOHEXOL 350 MG/ML SOLN  Comparison: 12/02/2011  Findings: Technically adequate study with moderately good opacification of the central and segmental pulmonary arteries.  No focal filling defects demonstrated.  No evidence of significant pulmonary embolus.  The left lower lobe pulmonary arteries are well opacified today without evidence of any significant residual pulmonary embolus.  Normal heart size.  Normal caliber thoracic aorta.  No  significant lymphadenopathy in the chest.  Esophagus is decompressed.  The small esophageal hiatal hernia.  Visualized portions of the upper abdominal organs are unremarkable.  No pleural effusion.  No focal airspace consolidation in the lungs.  No significant interstitial infiltration.  Airways appear patent.  Normal alignment of the thoracic vertebrae.  IMPRESSION: No significant residual pulmonary embolus.  No evidence of active consolidation in the lungs.  Original Report Authenticated By: Marlon Pel, M.D.   Ct Angio Chest W/cm &/or Wo Cm  12/02/2011  *RADIOLOGY REPORT*  Clinical Data: Chest pain, history of hyperthyroidism, hypertension, tachycardia  CT ANGIOGRAPHY CHEST  Technique:  Multidetector CT imaging of the chest using the standard protocol during bolus administration of intravenous contrast. Multiplanar reconstructed images including MIPs were obtained and reviewed to evaluate the vascular anatomy.  Contrast: OMNIPAQUE IOHEXOL 350 MG/ML SOLN  Comparison: None  Findings: Aorta normal caliber without aneurysm or dissection. Respiratory motion artifacts slightly degrade assessment of lower lobe pulmonary arteries. However, an irregular filling defect is identified in a left lower lobe pulmonary artery consistent with pulmonary embolism. No other pulmonary emboli are visualized. No thoracic adenopathy. Visualized portion of upper abdomen normal appearance. Lungs clear. No pleural effusion or pneumothorax. No acute osseous findings.  IMPRESSION: Irregular filling defect in a left lower lobe pulmonary artery consistent with pulmonary embolism. No other definite intrathoracic abnormalities identified.  Critical Value/emergent results were called by telephone at the time of interpretation on 12/02/2011  at 1807 hours  to Dr.Yelverton, who verbally acknowledged these results.  Original Report Authenticated By: Lollie Marrow, M.D.    PHYSICAL EXAM  Patient is oriented to person time and  place. Affect is normal. She seems anxious but is bothered by her tachycardia. Lungs are clear. Respiratory effort is nonlabored. There is no jugulovenous distention. Cardiac exam reveals S1 and S2. There no clicks or significant murmurs. There is no significant peripheral edema.   TELEMETRY: I personally reviewed telemetry. There sinus rhythm.   ASSESSMENT AND PLAN:   PE (pulmonary embolism)   The diagnosis of pulmonary embolism was made earlier in April, 2013. CT scan at that time showed a pulmonary embolus. Followup CT scan since this admission reveals that the embolus is resolved. She is on anticoagulation. It would seem that this problem is probably not the basis of her current symptoms.   SVT (supraventricular tachycardia)  There is a history of some atrial tachycardia in the past. I have reviewed all the EKGs available up to this point. I do not see the EKG that was ordered for today. When her rate is fast it appears that it is sinus tachycardia. At this point we have to assume that her hyperthyroidism is playing a role. I recommend further evaluation of the dose of her Tapazole to see if this should be adjusted. Her beta blocker  dose Barajas to be increased.   Hypertension   Hyperthyroidism without goiter    As I have mentioned above she Barajas further assessment of her thyroid status to see if the Tapazole dose Barajas to be adjusted.   Chest pain   Etiology of her chest pain is not clear. With this persistent tachycardia we need to be sure that she has not developed a rate-related cardiomyopathy. I am ordering a 2-D echo. Since her evaluation is complicated I feel we should proceed with a nuclear exercise study also to be sure that we are not seeing any evidence of significant ischemic disease.  Prolonged QT interval   This will have to be kept in mind over the long term relative to any medications she might receive.   Willa Rough 12/16/2011 8:49 AM

## 2011-12-16 NOTE — Progress Notes (Signed)
  Echocardiogram 2D Echocardiogram has been performed.  Cathie Beams Deneen 12/16/2011, 11:05 AM

## 2011-12-16 NOTE — Progress Notes (Signed)
Utilization review completed.  

## 2011-12-16 NOTE — ED Provider Notes (Signed)
Medical screening examination/treatment/procedure(s) were performed by non-physician practitioner and as supervising physician I was immediately available for consultation/collaboration.   Datrell Dunton, MD 12/16/11 0725 

## 2011-12-16 NOTE — Progress Notes (Signed)
PROGRESS NOTE  Jamie Barajas OZH:[id number] DOB: [birth year] DOA: 12/15/2011 PCP: Angelica Chessman., MD, MD  Brief narrative: 41 yr female admit 4/30 with substernal CP  Past medical history: Htn emergency Palpitations + SVT/Atrial tachycardia 2/2 to above  Recent Admit 4/17 with Acute PE ? T3 Hyperthyroidism on Tapazole  Consultants:  Cardiologist  Procedures:  CT angiogram chest 4/25 = no residual significant pulmonary embolism. No evidence of active consolidation in lung  Chest x-ray 4/30 = No active cardiopulmonary process  Antibiotics:  None   Subjective  Feels fair. Has some episodes of shortness of breath. States that her periods are slowing a little bit. No nausea no vomiting. Still has some mild chest pressure. States that the pains in her chest began yesterday And this is associated with shortness of breath in the morning prior to the pains. Pain is reproducible on palpation of the costovertebral junctions. Tolerating diet pretty well   Objective    Interim History: Reviewed in detail  Subjective: none  Objective: Filed Vitals:   12/15/11 1806 12/15/11 2008 12/16/11 0444 12/16/11 1312  BP: 129/90 121/84 111/74 118/77  Pulse: 107 82 85 90  Temp:  98.1 F (36.7 C) 98.3 F (36.8 C) 98.6 F (37 C)  TempSrc:  Oral Oral Oral  Resp:  18 18 18   Height:      Weight:   78.881 kg (173 lb 14.4 oz)   SpO2: 99% 97% 99% 100%    Intake/Output Summary (Last 24 hours) at 12/16/11 1352 Last data filed at 12/16/11 1230  Gross per 24 hour  Intake    480 ml  Output      4 ml  Net    476 ml    Exam:  General: Alert oriented pleasant African American female in no apparent distress. No icterus or pallor Cardiovascular: S1-S2 tachycardic. Regular rate rhythm. No JVD no bruit Respiratory: Clinically clear no added sound Abdomen: Soft nontender nondistended Skin no lower extremity swelling or rash   Data Reviewed: Basic Metabolic Panel:  Lab 12/16/11  0535 12/15/11 1322 12/11/11 1345 12/09/11 2348  NA 141 138 142 142  K 4.3 3.2* -- --  CL 108 102 107 106  CO2 24 22 -- --  GLUCOSE 93 141* 115* 135*  BUN 7 9 9 14   CREATININE 0.76 0.68 0.60 0.70  CALCIUM 9.0 9.7 -- --  MG -- -- -- --  PHOS -- -- -- --   Liver Function Tests: No results found for this basename: AST:5,ALT:5,ALKPHOS:5,BILITOT:5,PROT:5,ALBUMIN:5 in the last 168 hours No results found for this basename: LIPASE:5,AMYLASE:5 in the last 168 hours No results found for this basename: AMMONIA:5 in the last 168 hours CBC:  Lab 12/16/11 0535 12/15/11 1322 12/11/11 1345 12/09/11 2348 12/09/11 2324  WBC 5.2 6.0 -- -- 5.6  NEUTROABS -- 4.2 -- -- 4.0  HGB 11.0* 13.1 12.9 12.6 11.9*  HCT 32.9* 37.9 38.0 37.0 34.5*  MCV 67.6* 67.0* -- -- 66.5*  PLT 267 327 -- -- 192   Cardiac Enzymes:  Lab 12/16/11 0915 12/16/11 0105 12/15/11 1713 12/11/11 1338  CKTOTAL 55 60 62 --  CKMB 1.2 1.2 1.2 --  CKMBINDEX -- -- -- --  TROPONINI <0.30 <0.30 <0.30 <0.30   BNP: No components found with this basename: POCBNP:5 CBG: No results found for this basename: GLUCAP:5 in the last 168 hours  No results found for this or any previous visit (from the past 240 hour(s)).   Studies:  All Imaging reviewed and is as per above notation   Scheduled Meds:   . sodium chloride   Intravenous STAT  . alum & mag hydroxide-simeth  30 mL Oral Once  . amLODipine  10 mg Oral Daily  . aspirin  81 mg Oral Daily  . LORazepam  0.5 mg Intravenous Once  . magic mouthwash  10 mL Oral Q2H  . magnesium sulfate 1 - 4 g bolus IVPB  2 g Intravenous Once  . methimazole  20 mg Oral Daily  . metoprolol succinate  50 mg Oral BID  .  morphine injection  4 mg Intravenous Once  . potassium chloride  40 mEq Oral Once  . Rivaroxaban  15 mg Oral BID WC  . sodium chloride  1,000 mL Intravenous Once  . sodium chloride  3 mL Intravenous Q12H  . DISCONTD: metoprolol succinate  50 mg Oral Daily  . DISCONTD:  potassium chloride  40 mEq Oral Once   Continuous Infusions:     Assessment/Plan: 1. Chest pain-cardiac enzymes have ruled her out for acute coronary syndrome, but given the nature of her chest pain and other issues cardiology was consulted who are recommending a nuclear stress test on her tomorrow. Further course of care as well as disposition to be determined then. Would recommend discontinuing aspirin as soon as possible given her history of bleeding as per below if thought prudent by cardiologist 2. 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 Patient may benefit from discussion with her regular gynecologist with regards to this and teratogenicity.  Possibly could use Propylthiuracil. 3. Recent pulmonary embolism-patient complete 3-9 months of systemic anticoagulation. Cells was caused bleeding significantly in terms of her heavy menstruation and she will get a CBC in the morning. She has a regular gynecologist and might benefit from Depo Provera placement. Unclear if this would increase her risk of clot burden. If her hemoglobin drops any further would informally consult with gynecology and hematology 4. Supraventricular tachycardia-her telemetry shows regular is sinus tachycardia. This could be related to hypothyroid state. She seems to had this in the past and is ongoing. She will continue her Toprol-XL 50 mg twice a day. She probably would need to have this held overnight as she has a stress test scheduled tomorrow. 5. Prolonged QTC-get a magnesium level tomorrow morning 6. Hypokalemia-resolved  Code Status: Full Family Communication: None bedside Disposition Plan: Pending further work   Pleas Koch, MD  [facility] Pager [phone] 12/16/2011, 1:52 PM    LOS: 1 day

## 2011-12-17 DIAGNOSIS — R0600 Dyspnea, unspecified: Secondary | ICD-10-CM | POA: Diagnosis present

## 2011-12-17 DIAGNOSIS — R0609 Other forms of dyspnea: Secondary | ICD-10-CM

## 2011-12-17 DIAGNOSIS — R079 Chest pain, unspecified: Secondary | ICD-10-CM

## 2011-12-17 DIAGNOSIS — E0590 Thyrotoxicosis, unspecified without thyrotoxic crisis or storm: Secondary | ICD-10-CM

## 2011-12-17 DIAGNOSIS — R0989 Other specified symptoms and signs involving the circulatory and respiratory systems: Secondary | ICD-10-CM

## 2011-12-17 DIAGNOSIS — I2699 Other pulmonary embolism without acute cor pulmonale: Secondary | ICD-10-CM

## 2011-12-17 DIAGNOSIS — E876 Hypokalemia: Secondary | ICD-10-CM

## 2011-12-17 DIAGNOSIS — I10 Essential (primary) hypertension: Secondary | ICD-10-CM

## 2011-12-17 DIAGNOSIS — I498 Other specified cardiac arrhythmias: Secondary | ICD-10-CM

## 2011-12-17 DIAGNOSIS — I4581 Long QT syndrome: Secondary | ICD-10-CM

## 2011-12-17 LAB — CBC
Hemoglobin: 11 g/dL — ABNORMAL LOW (ref 12.0–15.0)
MCH: 22.4 pg — ABNORMAL LOW (ref 26.0–34.0)
MCV: 67.8 fL — ABNORMAL LOW (ref 78.0–100.0)
RBC: 4.91 MIL/uL (ref 3.87–5.11)

## 2011-12-17 MED ORDER — NITROGLYCERIN 0.4 MG SL SUBL
0.4000 mg | SUBLINGUAL_TABLET | SUBLINGUAL | Status: AC | PRN
Start: 2011-12-17 — End: 2011-12-17
  Administered 2011-12-17 (×2): 0.4 mg via SUBLINGUAL

## 2011-12-17 MED ORDER — MORPHINE SULFATE 4 MG/ML IJ SOLN
2.0000 mg | Freq: Once | INTRAMUSCULAR | Status: AC
  Administered 2011-12-17: 2 mg via INTRAVENOUS

## 2011-12-17 MED ORDER — MORPHINE SULFATE 2 MG/ML IJ SOLN: 2.0000 mg | Freq: Once | INTRAMUSCULAR | Status: DC

## 2011-12-17 MED ORDER — ENOXAPARIN SODIUM 40 MG/0.4ML ~~LOC~~ SOLN
40.0000 mg | SUBCUTANEOUS | Status: DC
  Administered 2011-12-18 – 2011-12-19 (×2): 40 mg via SUBCUTANEOUS
  Filled 2011-12-17 (×3): qty 0.4

## 2011-12-17 MED ORDER — DOCUSATE SODIUM 100 MG PO CAPS
100.0000 mg | ORAL_CAPSULE | Freq: Two times a day (BID) | ORAL | Status: DC
  Filled 2011-12-17 (×7): qty 1

## 2011-12-17 NOTE — Progress Notes (Signed)
PROGRESS NOTE  Jamie Barajas ZOX:[id number] DOB: Feb 11, 1971 DOA: 12/15/2011 PCP: Angelica Chessman., MD, MD  Brief narrative: 41 yr female admit 4/30 with substernal CP  Past medical history: Htn emergency Palpitations + SVT/Atrial tachycardia 2/2 to above  Recent Admit 4/17 with Acute PE ? T3 Hyperthyroidism on Tapazole  Consultants:  Cardiologist  Procedures:  CT angiogram chest 4/25 = no residual significant pulmonary embolism. No evidence of active consolidation in lung  Chest x-ray 4/30 = No active cardiopulmonary process  Antibiotics:  None   Subjective  Had some substernal left-sided chest pressure this morning, which was relieved by nitroglycerin and morphine. Character of the pain is different from what it was yesterday. There is no reproducibility to it today. Feels as if the pain wraps around her lower left chest. Currently chest pain-free Patient's periods have ceased and she only had tinge of blood today.   Objective    Interim History: Reviewed in detail  Subjective: none  Objective: Filed Vitals:   12/16/11 1312 12/16/11 2013 12/17/11 0442 12/17/11 0945  BP: 118/77 138/91 121/85 114/86  Pulse: 90 111 70 71  Temp: 98.6 F (37 C) 98 F (36.7 C) 97.5 F (36.4 C) 97.8 F (36.6 C)  TempSrc: Oral Oral Oral Oral  Resp: 18 18 18 20   Height:      Weight:      SpO2: 100% 100% 100% 100%    Intake/Output Summary (Last 24 hours) at 12/17/11 1121 Last data filed at 12/17/11 1000  Gross per 24 hour  Intake    480 ml  Output      6 ml  Net    474 ml    Exam:  General: Alert oriented pleasant African American female in no apparent distress. No icterus or pallor Cardiovascular: S1-S2. Regular rate rhythm. No JVD no bruit.  Telemetry shows predominant bradycardia in the 40s and 50s Respiratory: Clinically clear no added sound Abdomen: Soft nontender nondistended Skin no lower extremity swelling or rash   Data Reviewed: Basic Metabolic  Panel:  Lab 12/16/11 0535 12/15/11 1322 12/11/11 1345  NA 141 138 142  K 4.3 3.2* --  CL 108 102 107  CO2 24 22 --  GLUCOSE 93 141* 115*  BUN 7 9 9   CREATININE 0.76 0.68 0.60  CALCIUM 9.0 9.7 --  MG -- -- --  PHOS -- -- --   Liver Function Tests: No results found for this basename: AST:5,ALT:5,ALKPHOS:5,BILITOT:5,PROT:5,ALBUMIN:5 in the last 168 hours No results found for this basename: LIPASE:5,AMYLASE:5 in the last 168 hours No results found for this basename: AMMONIA:5 in the last 168 hours CBC:  Lab 12/17/11 0609 12/16/11 0535 12/15/11 1322 12/11/11 1345  WBC 4.8 5.2 6.0 --  NEUTROABS -- -- 4.2 --  HGB 11.0* 11.0* 13.1 12.9  HCT 33.3* 32.9* 37.9 38.0  MCV 67.8* 67.6* 67.0* --  PLT 263 267 327 --   Cardiac Enzymes:  Lab 12/16/11 0915 12/16/11 0105 12/15/11 1713 12/11/11 1338  CKTOTAL 55 60 62 --  CKMB 1.2 1.2 1.2 --  CKMBINDEX -- -- -- --  TROPONINI <0.30 <0.30 <0.30 <0.30   BNP: No components found with this basename: POCBNP:5 CBG: No results found for this basename: GLUCAP:5 in the last 168 hours  No results found for this or any previous visit (from the past 240 hour(s)).   Studies:              All Imaging reviewed and is as per above notation   Scheduled Meds:    .  alum & mag hydroxide-simeth  30 mL Oral Once  . amLODipine  10 mg Oral Daily  . aspirin  81 mg Oral Daily  . magic mouthwash  10 mL Oral Q2H  . methimazole  20 mg Oral Daily  . metoprolol succinate  50 mg Oral BID  . morphine  2 mg Intravenous Once  .  morphine injection  4 mg Intravenous Once  . Rivaroxaban  15 mg Oral BID WC  . sodium chloride  3 mL Intravenous Q12H  . DISCONTD: metoprolol succinate  50 mg Oral BID  . DISCONTD:  morphine injection  2 mg Intravenous Once  . DISCONTD:  morphine injection  2 mg Intravenous Once   Continuous Infusions:     Assessment/Plan: 1. Chest pain-cardiac enzymes have ruled her out for acute coronary syndrome-she had a repeat done today which  was normal with no EKG changes either, discussion with Dr. Riley Kill today 5/2 shows that she was exposed to IV contrast iodinated for CT angiogram of the chest and she will likely get worsening of her thyrotoxicosis from contrast. Her stress test was canceled today and further testing per cardiology is deferred. It is felt that her chest pain may be multifactorial. I did speak to the pulmonary critical care today to consult given the fact that CT angiogram images on 4/17 and 40/25 are significantly changed. Review of the 4/17 images with radiology overreading these images shows questionable PE 4/17. We are left with the thought that this may be secondary to her thyrotoxicosis causing her tachycardia and chest pain resulting from that. I have had a full discussion with the family with regards to this and the patient and although the diagnosis is still unclear, pulmonary critical care will be seeing her today to help further delineate whether she does need anticoagulation for pulmonary embolism or whether she doesn't-the answer to this at this point is equivocal. I did attempt to contact Dr. Evlyn Kanner of endocrinology to see the patient however he will be in the office tomorrow. I will attempt to do so again tomorrow-she will continue Xarelto we'll for now 2. 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 Patient may benefit from discussion with her regular gynecologist with regards to this and teratogenicity.  Possibly could use Propylthiuracil-will be getting endocrinology consult tomorrow-she has seen an Dr. Welford Roche of of [facility] endocrinology at # [phone] and we will get their office recrds 3. Recent pulmonary embolism-patient complete 3-9 months of systemic anticoagulation. Xarelto caused bleeding significantly in terms of her heavy menstruation although CBC is now stable at 11.0 She has a regular gynecologist and might benefit from Depo Provera placement. Unclear if this would increase her  risk of clot burden.  4. Supraventricular tachycardia-her telemetry shows regular is sinus tachycardia. This could be related to hypothyroid state. She seems to had this in the past and is ongoing. She will continue her Toprol-XL 50 mg twice a day.  5. Prolonged QTC-get a magnesium level tomorrow morning. QTC was 453 this am 6. Hypokalemia-resolved 7. Low HDL-23.  Benefit from exercise when able to do so  Code Status: Full Family Communication: None bedside Disposition Plan: Pending further work   Pleas Koch, MD  [facility] Pager [phone] 12/17/2011, 11:21 AM    LOS: 2 days

## 2011-12-17 NOTE — Progress Notes (Signed)
Patient: Jamie Barajas Date of Encounter: 12/17/2011, 7:37 AM Admit date: 12/15/2011     Subjective  Patient developed L sided CP radiating to her back this AM that she initially reported as a tightness/pressure sensation but later described it as a "catch" sensation. It is not worse with inspiration or palpation. The "catch" is possibly worse with twisting but the pressure remains. 3 NTG helped ease pain gradually but morphine helped the most. Pain now down to 2/10. EKG without acute changes. CE's have been negative thus far. Nuclear study was scheduled but is now on hold.   Objective   Telemetry: NSR Physical Exam: Filed Vitals:   12/17/11 0442  BP: 121/85  Pulse: 70  Temp: 97.5 F (36.4 C)  Resp: 18   General: Well developed, well nourished, in no acute distress comfortable appearing. Head: Normocephalic, atraumatic, sclera non-icteric, no xanthomas, nares are without discharge.  Neck: Negative for carotid bruits. JVD not elevated. Lungs: Clear bilaterally to auscultation without wheezes, rales, or rhonchi. Breathing is unlabored. Heart: RRR S1 S2 without murmurs, rubs, or gallops.  Abdomen: Soft, non-tender, non-distended with normoactive bowel sounds. No hepatomegaly. No rebound/guarding. No obvious abdominal masses. Msk:  Strength and tone appear normal for age. Extremities: No clubbing or cyanosis. No edema.  Distal pedal pulses are 2+ and equal bilaterally. Neuro: Alert and oriented X 3. Moves all extremities spontaneously. Psych:  Responds to questions appropriately with a normal affect.    Intake/Output Summary (Last 24 hours) at 12/17/11 0737 Last data filed at 12/16/11 1230  Gross per 24 hour  Intake    240 ml  Output      4 ml  Net    236 ml    Inpatient Medications:    . alum & mag hydroxide-simeth  30 mL Oral Once  . amLODipine  10 mg Oral Daily  . aspirin  81 mg Oral Daily  . magic mouthwash  10 mL Oral Q2H  . methimazole  20 mg Oral Daily  .  metoprolol succinate  50 mg Oral BID  . morphine  2 mg Intravenous Once  .  morphine injection  4 mg Intravenous Once  . Rivaroxaban  15 mg Oral BID WC  . sodium chloride  3 mL Intravenous Q12H  . DISCONTD: metoprolol succinate  50 mg Oral Daily  . DISCONTD: metoprolol succinate  50 mg Oral BID  . DISCONTD:  morphine injection  2 mg Intravenous Once  . DISCONTD:  morphine injection  2 mg Intravenous Once    Labs:  [facility] 12/16/11 0535 12/15/11 1322  NA 141 138  K 4.3 3.2*  CL 108 102  CO2 24 22  GLUCOSE 93 141*  BUN 7 9  CREATININE 0.76 0.68  CALCIUM 9.0 9.7  MG -- --  PHOS -- --    Basename 12/17/11 0609 12/16/11 0535 12/15/11 1322  WBC 4.8 5.2 --  NEUTROABS -- -- 4.2  HGB 11.0* 11.0* --  HCT 33.3* 32.9* --  MCV 67.8* 67.6* --  PLT 263 267 --    Basename 12/16/11 0915 12/16/11 0105 12/15/11 1713  CKTOTAL 55 60 62  CKMB 1.2 1.2 1.2  TROPONINI <0.30 <0.30 <0.30   Basename 12/16/11 0535  CHOL 100  HDL 23*  LDLCALC 53  TRIG 409  CHOLHDL 4.3    Basename 12/15/11 1544  TSH 0.010*  T4TOTAL --  T3FREE 4.4*  THYROIDAB --   Radiology/Studies:  1. Chest 2 View 12/15/2011  *RADIOLOGY REPORT*  Clinical Data: Chest  pain.  CHEST - 2 VIEW  Comparison: 12/02/2011  Findings: Heart and mediastinal contours are within normal limits. No focal opacities or effusions.  No acute bony abnormality.  IMPRESSION: No active cardiopulmonary disease.  Original Report Authenticated By: Cyndie Chime, M.D  2. Ct Angio Chest W/cm &/or Wo Cm 12/10/2011  *RADIOLOGY REPORT*  Clinical Data: Chest pain and tachycardia beginning 20 minutes ago. Vaginal bleeding.  History of pulmonary embolus on Friday.  CT ANGIOGRAPHY CHEST  Technique:  Multidetector CT imaging of the chest using the standard protocol during bolus administration of intravenous contrast. Multiplanar reconstructed images including MIPs were obtained and reviewed to evaluate the vascular anatomy.  Contrast: OMNIPAQUE IOHEXOL  350 MG/ML SOLN  Comparison: 12/02/2011  Findings: Technically adequate study with moderately good opacification of the central and segmental pulmonary arteries.  No focal filling defects demonstrated.  No evidence of significant pulmonary embolus.  The left lower lobe pulmonary arteries are well opacified today without evidence of any significant residual pulmonary embolus.  Normal heart size.  Normal caliber thoracic aorta.  No significant lymphadenopathy in the chest.  Esophagus is decompressed.  The small esophageal hiatal hernia.  Visualized portions of the upper abdominal organs are unremarkable.  No pleural effusion.  No focal airspace consolidation in the lungs.  No significant interstitial infiltration.  Airways appear patent.  Normal alignment of the thoracic vertebrae.  IMPRESSION: No significant residual pulmonary embolus.  No evidence of active consolidation in the lungs.  Original Report Authenticated By: Marlon Pel, M.D.   3. Ct Angio Chest W/cm &/or Wo Cm 12/02/2011  *RADIOLOGY REPORT*  Clinical Data: Chest pain, history of hyperthyroidism, hypertension, tachycardia  CT ANGIOGRAPHY CHEST  Technique:  Multidetector CT imaging of the chest using the standard protocol during bolus administration of intravenous contrast. Multiplanar reconstructed images including MIPs were obtained and reviewed to evaluate the vascular anatomy.  Contrast: OMNIPAQUE IOHEXOL 350 MG/ML SOLN  Comparison: None  Findings: Aorta normal caliber without aneurysm or dissection. Respiratory motion artifacts slightly degrade assessment of lower lobe pulmonary arteries. However, an irregular filling defect is identified in a left lower lobe pulmonary artery consistent with pulmonary embolism. No other pulmonary emboli are visualized. No thoracic adenopathy. Visualized portion of upper abdomen normal appearance. Lungs clear. No pleural effusion or pneumothorax. No acute osseous findings.  IMPRESSION: Irregular filling  defect in a left lower lobe pulmonary artery consistent with pulmonary embolism. No other definite intrathoracic abnormalities identified.  Critical Value/emergent results were called by telephone at the time of interpretation on 12/02/2011  at 1807 hours  to Dr.Yelverton, who verbally acknowledged these results.  Original Report Authenticated By: Lollie Marrow, M.D.    Assessment and Plan   1. Chest pain, with some atypical features this AM and no objective evidence of ischemia. Unremarkable echo yesterday. Unclear etiology as this morning's pain seems to be different than prior pains, ?MSK. Discussed with Dr. Riley Kill given ongoing sx and we are considering proceeding with resting images only given resting symptoms. Await final rec per MD.   2. Recent PE by CTA 12/02/11 - no residual significant PE by repeat CT 12/10/11 (done on separate ER visit for CP). Continue Xarelto. Sx less likely related to this.  3. Hyperthyroidism - initiated on methimizole 11/2011, with BB increased yesterday. Discussed possible role of contrast in relation to thyroid dysfunction with Dr. Riley Kill & given exposure to contrast dye x 2, would STRONGLY recommend inpatient endocrinology evaluation.  4. Microcytic anemia most likely  related to menorrhagia on Xarelto - follow closely while on anticoag. Consider empiric iron supplementation.  5. HTN - BP controlled  6. QTc prolongation - stable QTc at 453 today.  Signed, Ronie Spies PA-C  Patient seen and carefully evaluated and entire history reviewed.  I also reviewed her CTs in detail in radiology with the radiologist in CT today who does chest CT.  Her history is ongoing, and she presented with some chest pain.  The pain is inframmary and radiates around to the left.  Her enzymes are consistently negative.  Her ECG has only minor nonspecific changes.   Her exam is unremarkable at present.    I have reviewed her CTs in detail.  The radiologist today questioned wether he would  have called a PE on the original study.  He feels the follow up is clearly negative.  The study was not done as coronary CTA, but there is no coronary calcification and the coronaries are visualized on the study without obvious defect.  She now has had two contrast studies in the context of hyperthyroidism, and I would be cautious about this as this could be associated with a thyroid worsening.    I have spoken with the primary team regarding her situation.  I would favor  1.  Pulmonary consult to get their opinion regarding treatment for PE,  She has a microcytic anemia and is on Xarelto.  Radiology raised the issue of uncertainty in the diagnosis.  She is at some risk for thrombotic events, but may not have had one.  So the issue is:   1.  How likely the diagnosis? 2.  Would xarelto or anticoagulation be indicated. She menstruates, and has risks associated with bleeding especially in light of treatment.  2.  Regarding chest pain, I would titrate her BBLockers for now.  Her symptoms are somewhat atypical, enzymes neg, and ECG is unchanged.  Given the CT findings  (no calcium) , and her underlying diagnosis, I would watch her for now primarily.  Her liklihood of ACS seems relatively low.    3.  I would get a formal endocrine consult.  Importantly, many of her symptoms could well be related to the thyroid issue.  Careful management, drug selection is the most important parameter at present.

## 2011-12-17 NOTE — Progress Notes (Signed)
Utilization review complete 

## 2011-12-17 NOTE — Consult Note (Signed)
PULMONARY/CCM CONSULT NOTE  Requesting MD/Service: Samtani/TRH Date of admission: 4/30 Date of consult: 5/2 Reason for consultation: recent dx of PE, heavy menses with significant Hct drop  HPI:  1 yobf with recently diagnosed hyperthyroidism presented 4/17 with palpitations and lightheadedness. Underwent eval that included CTA chest which was interpreted as positive for PE in LLL artery. She was discharged on Xarelto. She had recurrence of similar symptoms 4/25 and presented to ED where CTA chest was repeated and was interpreted as showing no PE. She continued to have problems with tachycardia and chest tightness with lightheadedness and was admitted again on 4/30 for further evaluation.  She has noted very heavy menses and has had a 2 gram drop in her Hgb since her last admission   Past Medical History  Diagnosis Date  . Tachycardia   . Hypertension   . Sickle cell trait   . Uveitis   . Angina   . Pulmonary embolism on left 12/02/11  . Sleep apnea   . Hyperthyroidism   . Headache   . Anxiety   . PTSD (post-traumatic stress disorder)   . Shortness of breath     "w/heart racing"  . SVT (supraventricular tachycardia)   . Tachyarrhythmia 12/15/11    tachycardia  . Prolonged QT interval     MEDICATIONS: reviewed  History   Social History  . Marital Status: Married    Spouse Name: N/A    Number of Children: N/A  . Years of Education: N/A   Occupational History  . Not on file.   Social History Main Topics  . Smoking status: Never Smoker   . Smokeless tobacco: Never Used  . Alcohol Use: Yes     12/15/11 "glass of wine maybe 3 times/month"  . Drug Use: No  . Sexually Active: Yes   Other Topics Concern  . Not on file   Social History Narrative   Lives with husband    Family History  Problem Relation Age of Onset  . Stroke Mother   . Cancer Mother   . Hypertension Mother   . Sarcoidosis Mother   . Stroke Father   . Hypertension Father   . Hyperlipidemia Father    No family history of thrombophilia  ROS - Denies pleurodynia, hemoptysis, LE edema, calf tenderness  Filed Vitals:   12/17/11 0442 12/17/11 0945 12/17/11 1326 12/17/11 2147  BP: 121/85 114/86 118/81 121/85  Pulse: 70 71 69 72  Temp: 97.5 F (36.4 C) 97.8 F (36.6 C) 98.5 F (36.9 C) 98.5 F (36.9 C)  TempSrc: Oral Oral Oral Oral  Resp: 18 20 18 18   Height:      Weight:      SpO2: 100% 100% 100% 100%    EXAM:  Gen: WDWN, NAD HEENT: NAD Neck: No JVD Lungs: Clear to auscultation and percussion Cardiovascular: RRR s M Abdomen: NABS, soft, NT Ext: No edema Neuro: No focal deficits  DATA:  BMET    Component Value Date/Time   NA 141 12/16/2011 0535   K 4.3 12/16/2011 0535   CL 108 12/16/2011 0535   CO2 24 12/16/2011 0535   GLUCOSE 93 12/16/2011 0535   BUN 7 12/16/2011 0535   CREATININE 0.76 12/16/2011 0535   CALCIUM 9.0 12/16/2011 0535   GFRNONAA >90 12/16/2011 0535   GFRAA >90 12/16/2011 0535    Lab Results  Component Value Date   WBC 4.8 12/17/2011   HGB 11.0* 12/17/2011   HCT 33.3* 12/17/2011   MCV 67.8* 12/17/2011  PLT 263 12/17/2011    CXR: NACPD  CT chest 4/17: vague heterogenous filling of LLL artery, no definitive filling defect  CT chest 4/25: No PE, no acute disease  EKG: NSR, no acute abnormalities  LE venous dopplers 4/18 negative  IMPRESSION:   Symptom complex of recurrent tachycardia, lightheadedness, chest tightness are all likely well-explained by thyrotoxicosis, not yet fully controlled. I have reviewed her CT scans in detail Radiologist, Dr Tyron Russell (who read the CT chest from 4/17). Although the contrast of the LLL artery on that scan is not entirely normal, we agree that it is not diagnostic of PE. Further, she has had recurrent symptoms almost identical to her initial presentation with a repeat CT chest demonstrating no PE suggesting an alternative dx to explain her symptoms. The presence of heavy menses with a substantial drop in her Hct indicates that long term  anticoagulation is potentially unsafe in her.  PLAN/REC:  1) D/C Xarelto 2) Resume ASA 3) DVT prophylaxis while hospitalized until fully ambulatory (LMWH ordered) 4) To ensure that there is no significant risk of VTE, suggest serial venous doppler studies at 2 weeks and 4 weeks after discharge 5) When she is discharged, she needs to be well-versed on any symptoms that might be consistent with VTE, in particular LE edema, calf tenderness, pleuritic CP, sudden onset of dyspnea, hemoptysis, and needs to be instructed to seek medical attention immediately if any of these symptoms arise. 6) Agree with Cards eval for assistance with mgmt of tachyarrhythmias and witih Endocrinology eval for input re: mgmt of hyperthyroidism   I have discussed all of the above in detail with the patient and with Dr Mahala Menghini  PCCM will sign off. Please call if I can be of further assistance  Billy Fischer, MD;  PCCM service; Mobile [phone]

## 2011-12-18 DIAGNOSIS — I10 Essential (primary) hypertension: Secondary | ICD-10-CM

## 2011-12-18 DIAGNOSIS — R079 Chest pain, unspecified: Secondary | ICD-10-CM

## 2011-12-18 DIAGNOSIS — E0591 Thyrotoxicosis, unspecified with thyrotoxic crisis or storm: Secondary | ICD-10-CM

## 2011-12-18 DIAGNOSIS — E876 Hypokalemia: Secondary | ICD-10-CM

## 2011-12-18 LAB — CBC
HCT: 33.6 % — ABNORMAL LOW (ref 36.0–46.0)
MCV: 67.6 fL — ABNORMAL LOW (ref 78.0–100.0)
RDW: 14.5 % (ref 11.5–15.5)
WBC: 5.2 10*3/uL (ref 4.0–10.5)

## 2011-12-18 LAB — BASIC METABOLIC PANEL
BUN: 11 mg/dL (ref 6–23)
Chloride: 106 mEq/L (ref 96–112)
Creatinine, Ser: 0.82 mg/dL (ref 0.50–1.10)
GFR calc Af Amer: >90 mL/min (ref >90)

## 2011-12-18 MED ORDER — METHIMAZOLE 10 MG PO TABS
30.0000 mg | ORAL_TABLET | Freq: Every day | ORAL | Status: DC
  Administered 2011-12-18 – 2011-12-20 (×3): 30 mg via ORAL
  Filled 2011-12-18 (×3): qty 3

## 2011-12-18 NOTE — Progress Notes (Addendum)
Patient ID: Jamie Barajas, female   DOB: 1971-08-07, 41 y.o.   MRN: [id number]   SUBJECTIVE:  The patient is stable from the cardiac viewpoint. I have reviewed carefully all of the notes including notes by primary care, Dr.Stuckey of cardiology, and Dr. Sung Amabile of critical care medicine. There has been very careful review of the question of the prior diagnosis of pulmonary embolus. It is been decided that the original CT scan showed a vague abnormality, but it was not diagnostic for pulmonary embolus. The repeat CT scan with the same symptoms showed no pulmonary embolus. The point has also been made that the CT scan showed no calcification of the coronary arteries. The patient's heart rate has improved with higher dose metoprolol. 2-D echo shows excellent left jugular function. Decision was made for the patient not to proceed with exercise testing. There was question of additional dye load. I certainly agree with this diagnosis although I'm not aware that there is iodine in the agents needed for a Myoview scan. At this point I am very comfortable that there are no significant cardiac issues. The tachycardia is related to the patient's hyperthyroidism. It has been noted that further complete assessment by endocrinology will be very important for the choosing of the type of medicine and the dosing of the medicines for her hyperthyroidism.   Filed Vitals:   12/17/11 1326 12/17/11 2147 12/18/11 0514 12/18/11 0650  BP: 118/81 121/85 120/84   Pulse: 69 72 68   Temp: 98.5 F (36.9 C) 98.5 F (36.9 C) 98.2 F (36.8 C)   TempSrc: Oral Oral Oral   Resp: 18 18 18    Height:      Weight:    171 lb 11.8 oz (77.9 kg)  SpO2: 100% 100% 100%     Intake/Output Summary (Last 24 hours) at 12/18/11 0720 Last data filed at 12/17/11 1630  Gross per 24 hour  Intake    720 ml  Output      9 ml  Net    711 ml    LABS: Basic Metabolic Panel:  Basename 12/18/11 0607 12/16/11 0535  NA 140 141  K 4.2 4.3    CL 106 108  CO2 26 24  GLUCOSE 95 93  BUN 11 7  CREATININE 0.82 0.76  CALCIUM 9.3 9.0  MG 2.4 --  PHOS -- --   Liver Function Tests: No results found for this basename: AST:2,ALT:2,ALKPHOS:2,BILITOT:2,PROT:2,ALBUMIN:2 in the last 72 hours No results found for this basename: LIPASE:2,AMYLASE:2 in the last 72 hours CBC:  Basename 12/18/11 0607 12/17/11 0609 12/15/11 1322  WBC 5.2 4.8 --  NEUTROABS -- -- 4.2  HGB 11.2* 11.0* --  HCT 33.6* 33.3* --  MCV 67.6* 67.8* --  PLT 274 263 --   Cardiac Enzymes:  Basename 12/16/11 0915 12/16/11 0105 12/15/11 1713  CKTOTAL 55 60 62  CKMB 1.2 1.2 1.2  CKMBINDEX -- -- --  TROPONINI <0.30 <0.30 <0.30   BNP: No components found with this basename: POCBNP:3 D-Dimer: No results found for this basename: DDIMER:2 in the last 72 hours Hemoglobin A1C: No results found for this basename: HGBA1C in the last 72 hours Fasting Lipid Panel:  Basename 12/16/11 0535  CHOL 100  HDL 23*  LDLCALC 53  TRIG 952  CHOLHDL 4.3  LDLDIRECT --   Thyroid Function Tests:  Basename 12/15/11 1544  TSH 0.010*  T4TOTAL --  T3FREE 4.4*  THYROIDAB --    RADIOLOGY: Dg Chest 2 View  12/15/2011  *RADIOLOGY REPORT*  Clinical Data: Chest pain.  CHEST - 2 VIEW  Comparison: 12/02/2011  Findings: Heart and mediastinal contours are within normal limits. No focal opacities or effusions.  No acute bony abnormality.  IMPRESSION: No active cardiopulmonary disease.  Original Report Authenticated By: Cyndie Chime, M.D.   Dg Chest 2 View  12/02/2011  *RADIOLOGY REPORT*  Clinical Data: Chest pain, shortness of breath, left arm numbness  CHEST - 2 VIEW  Comparison: Chest x-ray of 09/17/2009  Findings: The lungs are clear.  Mediastinal contours appear normal. The heart is within normal limits in size.  No bony abnormality is seen.  IMPRESSION: No active lung disease.  Original Report Authenticated By: Juline Patch, M.D.   Ct Angio Chest W/cm &/or Wo Cm  12/10/2011   *RADIOLOGY REPORT*  Clinical Data: Chest pain and tachycardia beginning 20 minutes ago. Vaginal bleeding.  History of pulmonary embolus on Friday.  CT ANGIOGRAPHY CHEST  Technique:  Multidetector CT imaging of the chest using the standard protocol during bolus administration of intravenous contrast. Multiplanar reconstructed images including MIPs were obtained and reviewed to evaluate the vascular anatomy.  Contrast: OMNIPAQUE IOHEXOL 350 MG/ML SOLN  Comparison: 12/02/2011  Findings: Technically adequate study with moderately good opacification of the central and segmental pulmonary arteries.  No focal filling defects demonstrated.  No evidence of significant pulmonary embolus.  The left lower lobe pulmonary arteries are well opacified today without evidence of any significant residual pulmonary embolus.  Normal heart size.  Normal caliber thoracic aorta.  No significant lymphadenopathy in the chest.  Esophagus is decompressed.  The small esophageal hiatal hernia.  Visualized portions of the upper abdominal organs are unremarkable.  No pleural effusion.  No focal airspace consolidation in the lungs.  No significant interstitial infiltration.  Airways appear patent.  Normal alignment of the thoracic vertebrae.  IMPRESSION: No significant residual pulmonary embolus.  No evidence of active consolidation in the lungs.  Original Report Authenticated By: Marlon Pel, M.D.   Ct Angio Chest W/cm &/or Wo Cm  12/17/2011  **ADDENDUM** CREATED: 12/17/2011 13:16:26  Discussed case with Dr. Cathie Hoops.  Additional history provided, the patient having hyperthyroidism with hyperthyroid symptoms, shortness of breath, and palpitations, as well as recurrent identical symptoms 1 week later despite anticoagulation. The patient has had an interval negative lower extremity Doppler exam and a negative follow-up CTA chest study. The patient has had menorrhagia since beginning in relation.  After further review of the initial CTA  exam of 12/02/2011, the area of abnormal attenuation in the left lower lobe pulmonary artery may represent a motion artifact related to cardiac pulsation or minimal respiratory motion.  This is not a definite well-defined filling defect.  While not representing normal pulmonary arterial opacification, the identified left lower lobe pulmonary arterial abnormality may represent an artifact rather than a small pulmonary embolus.  **END ADDENDUM** SIGNED BY: Loraine Leriche A. Tyron Russell, M.D.    12/02/2011  *RADIOLOGY REPORT*  Clinical Data: Chest pain, history of hyperthyroidism, hypertension, tachycardia  CT ANGIOGRAPHY CHEST  Technique:  Multidetector CT imaging of the chest using the standard protocol during bolus administration of intravenous contrast. Multiplanar reconstructed images including MIPs were obtained and reviewed to evaluate the vascular anatomy.  Contrast: OMNIPAQUE IOHEXOL 350 MG/ML SOLN  Comparison: None  Findings: Aorta normal caliber without aneurysm or dissection. Respiratory motion artifacts slightly degrade assessment of lower lobe pulmonary arteries. However, an irregular filling defect is identified in a left lower lobe pulmonary artery consistent with pulmonary embolism.  No other pulmonary emboli are visualized. No thoracic adenopathy. Visualized portion of upper abdomen normal appearance. Lungs clear. No pleural effusion or pneumothorax. No acute osseous findings.  IMPRESSION: Irregular filling defect in a left lower lobe pulmonary artery consistent with pulmonary embolism. No other definite intrathoracic abnormalities identified.  Critical Value/emergent results were called by telephone at the time of interpretation on 12/02/2011  at 1807 hours  to Dr.Yelverton, who verbally acknowledged these results. Original Report Authenticated By: Lollie Marrow, M.D.    PHYSICAL EXAM   Patient is oriented to person, place. Affect is normal. The patient's mother is in the room. There is no jugulovenous  distention. Lungs are clear. Respiratory effort is nonlabored. Cardiac exam reveals S1 and S2. There no clicks or significant murmurs. The abdomen is soft. There is no peripheral edema.   TELEMETRY: I have personally reviewed telemetry. There sinus rhythm.   ASSESSMENT AND PLAN:    TIA (transient ischemic attack)   I do not know all the details of this issue. There has been question as to whether or not aspirin should be continued. Aspirin would be appropriate for this issue. There is no absolute cardiac indication for aspirin at this time.   PE (pulmonary embolism)    As I discussed in my first thoughts of this note, it appears that the patient is no longer being treated for a pulmonary embolus. Xarelto has been stopped.   SVT (supraventricular tachycardia)    The patient has had only sinus tachycardia. It is felt that this is related to her hyperthyroidism. Her beta blocker dose was increased. Her beta blocker dose can be increased further for rate control. The key issue will be the treatment of her thyroid.    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   Chest pain    It is felt that the patient's chest pain is not cardiac in origin.   Prolonged QT interval    The patient does have a prolonged QT interval. She has never had significant event thought to be related to an arrhythmia. She should be given a list of medications to be avoided. She does not need further workup.  The patient's cardiac status is stable. She does not need further cardiac evaluation. In addition she does not need a cardiology office visit to followup this hospitalization. The key issues here will be primary care followup and  endocrinology followup. Those physicians should be involved in the titration of her medications.Cardiology we'll sign  off.  Willa Rough 12/18/2011 7:20 AM

## 2011-12-18 NOTE — Progress Notes (Signed)
Utilization review complete 

## 2011-12-18 NOTE — Progress Notes (Signed)
PROGRESS NOTE  Jamie Barajas ZOX:[id number] DOB: [birth year] DOA: 12/15/2011 PCP: Angelica Chessman., MD, MD  Brief narrative: 41 yr female admit 4/30 with substernal CP  Past medical history: Htn emergency Palpitations + SVT/Atrial tachycardia 2/2 to above  Recent Admit 4/17 with Acute PE ? T3 Hyperthyroidism on Tapazole  Consultants:  Cardiologist  Procedures:  CT angiogram chest 4/25 = no residual significant pulmonary embolism. No evidence of active consolidation in lung  Chest x-ray 4/30 = No active cardiopulmonary process  Antibiotics:  None   Subjective  Chest pain resolved. No further complaints. Has complaints of fluttering in chest little earlier today Ambulating having no shortness of breath.  Asks if she can take Ativan for anxiety   Objective    Interim History: Reviewed in detail  Subjective: none  Objective: Filed Vitals:   12/18/11 0514 12/18/11 0650 12/18/11 0856 12/18/11 1442  BP: 120/84  120/68 130/85  Pulse: 68  72 96  Temp: 98.2 F (36.8 C)   98.3 F (36.8 C)  TempSrc: Oral   Oral  Resp: 18   18  Height:      Weight:  77.9 kg (171 lb 11.8 oz)    SpO2: 100%   100%    Intake/Output Summary (Last 24 hours) at 12/18/11 1451 Last data filed at 12/18/11 1200  Gross per 24 hour  Intake    480 ml  Output      8 ml  Net    472 ml    Exam:  General: Alert oriented pleasant African American female in no apparent distress. No icterus or pallor Cardiovascular: S1-S2. Regular rate rhythm. No JVD no bruit.  Telemetry shows predominant bradycardia in the 40s and 50s Respiratory: Clinically clear no added sound Abdomen: Soft nontender nondistended Skin no lower extremity swelling or rash   Data Reviewed: Basic Metabolic Panel:  Lab 12/18/11 8119 12/16/11 0535 12/15/11 1322  NA 140 141 138  K 4.2 4.3 --  CL 106 108 102  CO2 26 24 22   GLUCOSE 95 93 141*  BUN 11 7 9   CREATININE 0.82 0.76 0.68  CALCIUM 9.3 9.0 9.7  MG 2.4 -- --    PHOS -- -- --   Liver Function Tests: No results found for this basename: AST:5,ALT:5,ALKPHOS:5,BILITOT:5,PROT:5,ALBUMIN:5 in the last 168 hours No results found for this basename: LIPASE:5,AMYLASE:5 in the last 168 hours No results found for this basename: AMMONIA:5 in the last 168 hours CBC:  Lab 12/18/11 0607 12/17/11 0609 12/16/11 0535 12/15/11 1322  WBC 5.2 4.8 5.2 6.0  NEUTROABS -- -- -- 4.2  HGB 11.2* 11.0* 11.0* 13.1  HCT 33.6* 33.3* 32.9* 37.9  MCV 67.6* 67.8* 67.6* 67.0*  PLT 274 263 267 327   Cardiac Enzymes:  Lab 12/16/11 0915 12/16/11 0105 12/15/11 1713  CKTOTAL 55 60 62  CKMB 1.2 1.2 1.2  CKMBINDEX -- -- --  TROPONINI <0.30 <0.30 <0.30   BNP: No components found with this basename: POCBNP:5 CBG: No results found for this basename: GLUCAP:5 in the last 168 hours  No results found for this or any previous visit (from the past 240 hour(s)).   Studies:              All Imaging reviewed and is as per above notation   Scheduled Meds:    . alum & mag hydroxide-simeth  30 mL Oral Once  . amLODipine  10 mg Oral Daily  . aspirin  81 mg Oral Daily  . docusate sodium  100 mg  Oral BID  . enoxaparin (LOVENOX) injection  40 mg Subcutaneous Q24H  . magic mouthwash  10 mL Oral Q2H  . methimazole  30 mg Oral Daily  . metoprolol succinate  50 mg Oral BID  .  morphine injection  4 mg Intravenous Once  . sodium chloride  3 mL Intravenous Q12H  . DISCONTD: methimazole  20 mg Oral Daily   Continuous Infusions:     Assessment/Plan: 1. Chest pain-cardiac enzymes have ruled her out for acute coronary syndrome-she had a repeat done today which was normal with no EKG changes either, discussion with Dr. Riley Kill today 5/2 shows that she was exposed to IV contrast iodinated for CT angiogram of the chest and she will likely get worsening of her thyrotoxicosis from contrast. Her stress test was canceled today and further testing per cardiology is deferred. It is felt that her  chest pain may be multifactorial-pulmonology saw patient and over read CT scan with the opinion that this is not likely related to pulmonary embolism placed on aspirin. 2. 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 Patient may benefit from discussion with her regular gynecologist with regards to this and teratogenicity-have counseled her Re: this and contraception.  Possibly could use Propylthiuracil-spoke with Dr. Evlyn Kanner of Endocrinology who recommended to increase the Methimazole from 20mg -->30 mg 5/3-she has seen an Dr. Welford Roche of of [facility] endocrinology at # [phone]-I tried to call and patient ultimately will follow up with her for this 3. Recent pulmonary embolism-patient complete 3-9 months of systemic anticoagulation. Xarelto caused bleeding significantly in terms of her heavy menstruation although CBC is now stable at 11.0 She has a regular gynecologist and might benefit from Depo Provera placement.  She has completed family and doesn't wish any more additions  Unclear if this would increase her risk of clot burden.  4. Supraventricular tachycardia-her telemetry shows regular is sinus tachycardia. This could be related to hypothyroid state. She seems to had this in the past and is ongoing. She will continue her Toprol-XL 50 mg twice a day-she felt a little light headed 5/3- suspect bradycardia, need to ensure review pulse while ambulant 5. Prolonged QTC-get a magnesium level tomorrow morning. QTC was 453 this am 6. Hypokalemia-resolved 7. Low HDL-23.  Benefit from exercise when able to do so  Code Status: Full Family Communication: discussed with Father at bedside in detail ultimate plan being likely d/c 2-3 days.  All q's answered  Will review her clincally in am and reassess Disposition Plan: Pending further work   Pleas Koch, MD  [facility] Pager [phone] 12/18/2011, 2:51 PM    LOS: 3 days

## 2011-12-19 DIAGNOSIS — E0591 Thyrotoxicosis, unspecified with thyrotoxic crisis or storm: Secondary | ICD-10-CM

## 2011-12-19 DIAGNOSIS — I10 Essential (primary) hypertension: Secondary | ICD-10-CM

## 2011-12-19 DIAGNOSIS — E876 Hypokalemia: Secondary | ICD-10-CM

## 2011-12-19 DIAGNOSIS — R079 Chest pain, unspecified: Secondary | ICD-10-CM

## 2011-12-19 NOTE — Progress Notes (Signed)
PROGRESS NOTE  Jamie Barajas ZOX:[id number] DOB: [birth year] DOA: 12/15/2011 PCP: Angelica Chessman., MD, MD  Brief narrative: 41 yr female admit 4/30 with substernal CP  Past medical history: Htn emergency Palpitations + SVT/Atrial tachycardia 2/2 to above  Recent Admit 4/17 with Acute PE ? T3 Hyperthyroidism on Tapazole  Consultants:  Cardiologist  Procedures:  CT angiogram chest 4/25 = no residual significant pulmonary embolism. No evidence of active consolidation in lung  Chest x-ray 4/30 = No active cardiopulmonary process  Antibiotics:  None   Subjective  Chest pain resolved. No further complaints. Some weakness to L side transiently after walking today Ambulating having no shortness of breath.  Some soreness to throat.  No n/v/d/fever/cp/sob/blurred vision, double vision Cough, cold   Objective    Interim History: Reviewed in detail  Subjective: none  Objective: Filed Vitals:   12/18/11 2154 12/19/11 0442 12/19/11 0500 12/19/11 1411  BP: 119/84 112/77  119/85  Pulse: 70 74  76  Temp: 97.7 F (36.5 C) 98.4 F (36.9 C)  98.3 F (36.8 C)  TempSrc: Oral Oral    Resp: 18 19  18   Height:      Weight:   78.1 kg (172 lb 2.9 oz)   SpO2: 100% 99%  96%    Intake/Output Summary (Last 24 hours) at 12/19/11 1650 Last data filed at 12/19/11 1348  Gross per 24 hour  Intake    720 ml  Output      2 ml  Net    718 ml    Exam:  General: Alert oriented pleasant African American female in no apparent distress. No icterus or pallor Cardiovascular: S1-S2. Regular rate rhythm. No JVD no bruit.  Telemetry shows predominant bradycardia in the 40s and 50s Respiratory: Clinically clear no added sound Abdomen: Soft nontender nondistended Skin no lower extremity swelling or rash CN 2-12 grossly intact.  Strength bilat equal, finger-nose finer wnl, sensation intact.   Data Reviewed: Basic Metabolic Panel:  Lab 12/18/11 8119 12/16/11 0535 12/15/11 1322  NA  140 141 138  K 4.2 4.3 --  CL 106 108 102  CO2 26 24 22   GLUCOSE 95 93 141*  BUN 11 7 9   CREATININE 0.82 0.76 0.68  CALCIUM 9.3 9.0 9.7  MG 2.4 -- --  PHOS -- -- --   Liver Function Tests: No results found for this basename: AST:5,ALT:5,ALKPHOS:5,BILITOT:5,PROT:5,ALBUMIN:5 in the last 168 hours No results found for this basename: LIPASE:5,AMYLASE:5 in the last 168 hours No results found for this basename: AMMONIA:5 in the last 168 hours CBC:  Lab 12/18/11 0607 12/17/11 0609 12/16/11 0535 12/15/11 1322  WBC 5.2 4.8 5.2 6.0  NEUTROABS -- -- -- 4.2  HGB 11.2* 11.0* 11.0* 13.1  HCT 33.6* 33.3* 32.9* 37.9  MCV 67.6* 67.8* 67.6* 67.0*  PLT 274 263 267 327   Cardiac Enzymes:  Lab 12/16/11 0915 12/16/11 0105 12/15/11 1713  CKTOTAL 55 60 62  CKMB 1.2 1.2 1.2  CKMBINDEX -- -- --  TROPONINI <0.30 <0.30 <0.30   BNP: No components found with this basename: POCBNP:5 CBG: No results found for this basename: GLUCAP:5 in the last 168 hours  No results found for this or any previous visit (from the past 240 hour(s)).   Studies:              All Imaging reviewed and is as per above notation   Scheduled Meds:    . alum & mag hydroxide-simeth  30 mL Oral Once  . amLODipine  10 mg Oral Daily  . aspirin  81 mg Oral Daily  . docusate sodium  100 mg Oral BID  . enoxaparin (LOVENOX) injection  40 mg Subcutaneous Q24H  . magic mouthwash  10 mL Oral Q2H  . methimazole  30 mg Oral Daily  . metoprolol succinate  50 mg Oral BID  .  morphine injection  4 mg Intravenous Once  . sodium chloride  3 mL Intravenous Q12H   Continuous Infusions:     Assessment/Plan: 1. Chest pain-cardiac enzymes have ruled her out for acute coronary syndrome-she had a repeat done today which was normal with no EKG changes either, discussion with Dr. Riley Kill today 5/2 shows that she was exposed to IV contrast iodinated for CT angiogram of the chest and she will likely get worsening of her thyrotoxicosis from  contrast. Her stress test was canceled  and further testing per cardiology is deferred. It is felt that her chest pain may bemultifactorial-pulmonology saw patient 5/2 and over read CT scan with Radiologist who originally read it with the opinion that this is not likely related to pulmonary embolism- placed on aspirin. 2. 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 Patient may benefit from discussion with her regular gynecologist with regards to this and teratogenicity-have counseled her Re: this and contraception.  Possibly could use Propylthiuracil-spoke with Dr. Evlyn Kanner of Endocrinology who recommended to increase the Methimazole from 20mg -->30 mg 5/3-she has seen an Dr. Welford Roche of of [facility] endocrinology at # [phone]-Patient has close follow-up with her this coming Tuesday 5/713 3. ?Pulmonary embolism-Patient thought not to warrant Anticoagulation based on CT scan initially done 4/17 being likely not PE-Seen by Pulmonologist and placed instead on ASA.  She has a regular gynecologist and might benefit from Depo Provera placement.  She has completed family and doesn't wish any more additions  Unclear if this would increase her risk of clot burden.  4. Supraventricular tachycardia-her telemetry shows regular is sinus tachycardia. This could be related to hypothyroid state. She seems to had this in the past and is ongoing. She will continue her Toprol-XL 50 mg twice a day-she felt a little light headed 5/3- suspect bradycardia, need to ensure review pulse while ambulant 5. Prolonged QTC-get a magnesium level tomorrow morning. QTC 0.44 6. Hypokalemia-resolved 7. Low HDL-23.  Benefit from exercise when able to do so  Code Status: Full Family Communication: discussed with Father at bedside in detail ultimate plan being likely d/c 2-3 days.  All q's answered  Will review her clincally in am and reassess Disposition Plan: Pending further work   Pleas Koch, MD  [facility] Pager [phone] 12/19/2011, 4:50 PM    LOS: 4 days

## 2011-12-20 DIAGNOSIS — R079 Chest pain, unspecified: Secondary | ICD-10-CM

## 2011-12-20 DIAGNOSIS — E876 Hypokalemia: Secondary | ICD-10-CM

## 2011-12-20 DIAGNOSIS — I10 Essential (primary) hypertension: Secondary | ICD-10-CM

## 2011-12-20 DIAGNOSIS — E0591 Thyrotoxicosis, unspecified with thyrotoxic crisis or storm: Secondary | ICD-10-CM

## 2011-12-20 MED ORDER — ASPIRIN 81 MG PO CHEW: 81.0000 mg | CHEWABLE_TABLET | Freq: Every day | ORAL | Status: AC

## 2011-12-20 MED ORDER — HYDROCODONE-ACETAMINOPHEN 5-325 MG PO TABS: 1.0000 | ORAL_TABLET | ORAL | Status: DC | PRN

## 2011-12-20 MED ORDER — METOPROLOL SUCCINATE ER 50 MG PO TB24: 50.0000 mg | ORAL_TABLET | Freq: Two times a day (BID) | ORAL | Status: DC

## 2011-12-20 MED ORDER — METHIMAZOLE 15 MG PO TABS: 30.0000 mg | ORAL_TABLET | Freq: Every day | ORAL | Status: DC

## 2011-12-20 NOTE — Progress Notes (Signed)
DC'd pt home with husband.  RN reviewed discharge instructions and med rec with pt and husband and they verbalized understanding of follow up appointments with MD.  RN dc's IV with catheter intact. Lodema Pilot [facility]

## 2011-12-20 NOTE — Discharge Summary (Signed)
[facility] Discharge Summary  Date of Admission: 12/15/2011 12:46 PM Admitter: @ADMITPROV @   Date of Discharge5/12/2011 Attending Physician: Rhetta Mura, MD  Things to Follow-up on: To see Dr. Welford Roche  For Thyroid related issues in 1-2 days (apt already scheduled) Continue current doses of Metoprolol and Methimazole Get cbc in 1-2 weeks    Jamie Barajas ZOX:[id number] DOB: 06/18/1971 DOA: 12/15/2011 PCP: Angelica Chessman., MD, MD  Brief narrative: 41 yr female admit 4/30 with substernal CP she was just admitted to the she was just admitted to the hospital with recently diagnosed pulmonary embolism also with issues related to hyperthyroidism, history support ventricular tachycardia and presented with substernal chest pressure radiating to her neck. She states that these episodes are worse exertion and a platelet when she rested. She had an EKG showing inferior T-wave inversions and also complained that on being on Xarelto her periods have been extremely heavy.  She underwent observation over 23 hours and will had her beta blocker double and cardiology was consulted because of the nature of chest pain.-It was noted initially showed a prolonged QT interval and lab tests including TSH free T3 free T4 were checked. She had been scheduled to followup with her endocrinologist Dr. Welford Roche and had kept that followup in and no specific changes have been made. Please see below for full details   Past medical history: Htn emergency Palpitations + SVT/Atrial tachycardia 2/2 to above   Recent Admit 4/17 with Acute PE ? T3 Hyperthyroidism on Tapazole Prolonged QTC  Consultants:  Cardiologist  Telephone consulted Dr. Evlyn Kanner of endocrinology  Procedures:  CT angiogram chest 4/25 = no residual significant pulmonary embolism. No evidence of active consolidation in lung   Chest x-ray 4/30 = No active cardiopulmonary process  Echocardiogram= 55-60%. No wall motion anomaly, no  diastolic dysfunction  Antibiotics:  None  Assessment/Plan: 1. Chest pain-cardiac enzymes have ruled her out for acute coronary syndrome-she had a repeat EKG this hospitalizeation done  EKG changes either, discussion with Dr. Riley Kill  5/2 shows that she was exposed to IV contrast iodinated for CT angiogram of the chest and she will likely get worsening of her thyrotoxicosis from contrast.  She had a stress test that initially had been ordered given the concern for rate related cardiomyopathy but her stress test was canceled  and a pulmonology consult as well as a endocrinology consult was requested. Pulmonology saw the patient and reviewed the CT scan from 4/17 and recent CT scan as above and the opinion was that her initial CT scan did not show pulmonary embolism. These films were reviewed with the radiologist who read the prior CT scan 4/17. It was felt that her chest pain was multifactorial-most likely related to noncardiac and non-pulmonary issues. Please see below for my discussion with regards to Dr. Evlyn Kanner. Patient was ultimately placed on aspirin and discontinued also of note is this was unlikely to be the cause of her chest pain. With ambulation patient still had occasional periods of shortness of breath initially during hospitalization however she was beta blocked down significantly and the pain went away although she still had some moderate tachycardia.  She never became hypotensive however did feel woozy She was encouraged continue same dose of beta blocker irrespective of this as she will resume regular activity. She will probably need follow up with Dr. Myrtis Ser as an outpatient 2. 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 Patient may benefit from discussion with her regular gynecologist with regards to this and teratogenicity-have  counseled her Re: this and contraception-she has completed her family does not wish any further additions to the family.   Possibly could use  Propylthiuracil as an outpatient-I spoke with Dr. Evlyn Kanner of Endocrinology who recommended to increase the Methimazole from 20mg -->30 mg 5/3-she has seen an Dr. Welford Roche of of [facility] endocrinology at # [phone]-Patient has close follow-up with her this coming Tuesday 12/22/11 at 940 in the morning and was encouraged to keep this followup. Further recommendations from Dr. Evlyn Kanner did not include specifically IV steroids and it was recommended to repeat T3 test T4 in a couple days but as her symptoms did resolve I will leave this up to outpatient endocrinologist to make that decision 3. ?Pulmonary embolism-Patient see above discussion to warrant Anticoagulation based on CT scan initially done 4/17 being likely not PE-Seen by Pulmonologist and placed instead on ASA.   4. Supraventricular tachycardia-her telemetry initially showed sinus tachycardia. This could be related to hyperthyroid state. She seems to had this in the past and is ongoing. She will continue her Toprol-XL 50 mg twice a day-she felt a little light headed 5/3- suspect bradycardia-she was continued on her beta blocker for the same and will continue on this dosage until seen by endocrinology/cardiology who signed off of her case. She will likely experiencing more supraventricular tachycardia and this is attributable to her T3 thyrotoxicosis. 5. Prolonged QTC-get a magnesium level tomorrow morning. QT 0.4 On discharge  6. Hypokalemia-resolved  7. Low HDL-23.  Benefit from exercise when able to do so 8. Mouth soreness-Could possibly be related to methimazole-seems to be improving and would recommend followup with outpatient physicians   Procedures Performed and pertinent labs: Dg Chest 2 View  12/15/2011  *RADIOLOGY REPORT*  Clinical Data: Chest pain.  CHEST - 2 VIEW  Comparison: 12/02/2011  Findings: Heart and mediastinal contours are within normal limits. No focal opacities or effusions.  No acute bony abnormality.  IMPRESSION: No active  cardiopulmonary disease.  Original Report Authenticated By: Cyndie Chime, M.D.   Dg Chest 2 View  12/02/2011  *RADIOLOGY REPORT*  Clinical Data: Chest pain, shortness of breath, left arm numbness  CHEST - 2 VIEW  Comparison: Chest x-ray of 09/17/2009  Findings: The lungs are clear.  Mediastinal contours appear normal. The heart is within normal limits in size.  No bony abnormality is seen.  IMPRESSION: No active lung disease.  Original Report Authenticated By: Juline Patch, M.D.   Ct Angio Chest W/cm &/or Wo Cm  12/10/2011  *RADIOLOGY REPORT*  Clinical Data: Chest pain and tachycardia beginning 20 minutes ago. Vaginal bleeding.  History of pulmonary embolus on Friday.  CT ANGIOGRAPHY CHEST  Technique:  Multidetector CT imaging of the chest using the standard protocol during bolus administration of intravenous contrast. Multiplanar reconstructed images including MIPs were obtained and reviewed to evaluate the vascular anatomy.  Contrast: OMNIPAQUE IOHEXOL 350 MG/ML SOLN  Comparison: 12/02/2011  Findings: Technically adequate study with moderately good opacification of the central and segmental pulmonary arteries.  No focal filling defects demonstrated.  No evidence of significant pulmonary embolus.  The left lower lobe pulmonary arteries are well opacified today without evidence of any significant residual pulmonary embolus.  Normal heart size.  Normal caliber thoracic aorta.  No significant lymphadenopathy in the chest.  Esophagus is decompressed.  The small esophageal hiatal hernia.  Visualized portions of the upper abdominal organs are unremarkable.  No pleural effusion.  No focal airspace consolidation in the lungs.  No significant interstitial infiltration.  Airways appear patent.  Normal alignment of the thoracic vertebrae.  IMPRESSION: No significant residual pulmonary embolus.  No evidence of active consolidation in the lungs.  Original Report Authenticated By: Marlon Pel, M.D.   Ct  Angio Chest W/cm &/or Wo Cm  12/17/2011  **ADDENDUM** CREATED: 12/17/2011 13:16:26  Discussed case with Dr. Cathie Hoops.  Additional history provided, the patient having hyperthyroidism with hyperthyroid symptoms, shortness of breath, and palpitations, as well as recurrent identical symptoms 1 week later despite anticoagulation. The patient has had an interval negative lower extremity Doppler exam and a negative follow-up CTA chest study. The patient has had menorrhagia since beginning in relation.  After further review of the initial CTA exam of 12/02/2011, the area of abnormal attenuation in the left lower lobe pulmonary artery may represent a motion artifact related to cardiac pulsation or minimal respiratory motion.  This is not a definite well-defined filling defect.  While not representing normal pulmonary arterial opacification, the identified left lower lobe pulmonary arterial abnormality may represent an artifact rather than a small pulmonary embolus.  **END ADDENDUM** SIGNED BY: Loraine Leriche A. Tyron Russell, M.D.    12/02/2011  *RADIOLOGY REPORT*  Clinical Data: Chest pain, history of hyperthyroidism, hypertension, tachycardia  CT ANGIOGRAPHY CHEST  Technique:  Multidetector CT imaging of the chest using the standard protocol during bolus administration of intravenous contrast. Multiplanar reconstructed images including MIPs were obtained and reviewed to evaluate the vascular anatomy.  Contrast: OMNIPAQUE IOHEXOL 350 MG/ML SOLN  Comparison: None  Findings: Aorta normal caliber without aneurysm or dissection. Respiratory motion artifacts slightly degrade assessment of lower lobe pulmonary arteries. However, an irregular filling defect is identified in a left lower lobe pulmonary artery consistent with pulmonary embolism. No other pulmonary emboli are visualized. No thoracic adenopathy. Visualized portion of upper abdomen normal appearance. Lungs clear. No pleural effusion or pneumothorax. No acute osseous findings.   IMPRESSION: Irregular filling defect in a left lower lobe pulmonary artery consistent with pulmonary embolism. No other definite intrathoracic abnormalities identified.  Critical Value/emergent results were called by telephone at the time of interpretation on 12/02/2011  at 1807 hours  to Dr.Yelverton, who verbally acknowledged these results. Original Report Authenticated By: Lollie Marrow, M.D.    Discharge Vitals & PE:  BP 131/91  Pulse 71  Temp(Src) 98.3 F (36.8 C) (Oral)  Resp 18  Ht 5\' 4"  (1.626 m)  Wt 78.1 kg (172 lb 2.9 oz)  BMI 29.55 kg/m2  SpO2 99%  LMP 12/09/2011 General: Alert oriented pleasant African American female in no apparent distress. No icterus or pallor Cardiovascular: S1-S2. Regular rate rhythm. No JVD no bruit.  Telemetry shows predominant bradycardia in the 40s and 50s Respiratory: Clinically clear no added sound Abdomen: Soft nontender nondistended Skin no lower extremity swelling or rash CN 2-12 grossly intact.  Strength bilat equal, finger-nose finer wnl, sensation intact   Discharge Labs: No results found for this or any previous visit (from the past 24 hour(s)).  Disposition and follow-up:   Ms.Clara Frazier-Snipes was discharged from in good  condition.    Follow-up Appointments:  Follow-up Information    Follow up with Angelica Chessman., MD.   Contact information:   [phone] [place] Josephs. [address] [place] [address] [phone]       Follow up with Lenord Fellers, MD on 12/22/2011. (at 09:40 am)    Contact information:   [address], [place] [address] [place] [address] [phone]       Follow  up with Willa Rough, MD. Schedule an appointment as soon as possible for a visit in 2 weeks.   Contact information:   [address] [place] [address] [phone]          Discharge Medications: Medication List  As of 12/20/2011  9:34 AM   STOP taking these  medications         Rivaroxaban 15 MG Tabs tablet         TAKE these medications         amLODipine 10 MG tablet   Commonly known as: NORVASC   Take 10 mg by mouth daily.      aspirin 81 MG chewable tablet   Chew 1 tablet (81 mg total) by mouth daily.      HYDROcodone-acetaminophen 5-325 MG per tablet   Commonly known as: NORCO   Take 1 tablet by mouth every 4 (four) hours as needed.      LORazepam 0.5 MG tablet   Commonly known as: ATIVAN   Take 0.25-0.5 mg by mouth daily as needed. For anxiety.      magic mouthwash Soln   Take 10 mLs by mouth every 2 (two) hours. Swish and spit      Methimazole 15 MG Tabs   Take 2 tablets (30 mg total) by mouth daily.      metoprolol succinate 50 MG 24 hr tablet   Commonly known as: TOPROL-XL   Take 1 tablet (50 mg total) by mouth 2 (two) times daily. Take with or immediately following a meal.      polyvinyl alcohol 1.4 % ophthalmic solution   Commonly known as: LIQUIFILM TEARS   Place 2 drops into both eyes as needed. For dry eyes           Medications Discontinued During This Encounter  Medication Reason  . methimazole (TAPAZOLE) 20 MG tablet Inpatient Standard  . metoprolol tartrate (LOPRESSOR) 25 MG tablet Change in therapy  . potassium chloride SA (K-DUR,KLOR-CON) CR tablet 40 mEq   . metoprolol succinate (TOPROL-XL) 24 hr tablet 50 mg   . metoprolol succinate (TOPROL-XL) 24 hr tablet 50 mg   . morphine 2 MG/ML injection 2 mg   . morphine 2 MG/ML injection 2 mg   . Rivaroxaban (XARELTO) tablet TABS 15 mg   . methimazole (TAPAZOLE) tablet 20 mg   . metoprolol succinate (TOPROL-XL) 25 MG 24 hr tablet Stop Taking at Discharge  . methimazole (TAPAZOLE) 10 MG tablet Stop Taking at Discharge  . Rivaroxaban (XARELTO) 15 MG TABS tablet Stop Taking at Discharge    > 40 minutes time spent preparing d/c summary, including direct face-face patient Time, contact with consultants, family and care coordination    Signed: Dareen Gutzwiller,JAI 12/20/2011, 9:34 AM

## 2011-12-24 NOTE — Progress Notes (Deleted)
Late entry for 12/18/2011 at 06:30: (12/24/2011) Pt complained of chest pain 5/10 under left breast radiating to back. B/P 125/82, HR 84, 100%on 2L. EKG done. 3 nitro sl given, pt stated no relief. MD on call notified and gave orders to give morphine prn. Still no relief of pain, MD on floor and came to see patient. Pt VS stable, B/P 105/70, HR 84. Pt. Stable. Information passed on to oncoming nurse.

## 2011-12-24 NOTE — Progress Notes (Signed)
Late entry for 12/16/2011 at 06:30: (12/24/2011) : Pt complained of chest pain 5/10 under left breast radiating to back. B/P 125/82, HR 84, 100%on 2L. EKG done. 3 nitro sl given, pt stated no relief. MD on call notified and gave orders to give morphine prn. Still no relief of pain, MD on floor and came to see patient. Pt VS stable, B/P 105/70, HR 84. Pt stable. Information passed on to oncoming nurse.

## 2011-12-28 ENCOUNTER — Encounter (HOSPITAL_COMMUNITY): Payer: Self-pay | Admitting: Emergency Medicine

## 2011-12-28 ENCOUNTER — Observation Stay (HOSPITAL_COMMUNITY)
Admission: EM | Admit: 2011-12-28 | Discharge: 2011-12-31 | Disposition: A | Discharge status: Home / Self Care | Payer: BC Managed Care – PPO | Attending: Cardiology | Admitting: Cardiology

## 2011-12-28 ENCOUNTER — Emergency Department (HOSPITAL_COMMUNITY): Payer: BC Managed Care – PPO

## 2011-12-28 DIAGNOSIS — R079 Chest pain, unspecified: Principal | ICD-10-CM

## 2011-12-28 DIAGNOSIS — H209 Unspecified iridocyclitis: Secondary | ICD-10-CM | POA: Insufficient documentation

## 2011-12-28 DIAGNOSIS — R0609 Other forms of dyspnea: Secondary | ICD-10-CM

## 2011-12-28 DIAGNOSIS — R000 Tachycardia, unspecified: Secondary | ICD-10-CM

## 2011-12-28 DIAGNOSIS — R0602 Shortness of breath: Secondary | ICD-10-CM | POA: Insufficient documentation

## 2011-12-28 DIAGNOSIS — I471 Supraventricular tachycardia: Secondary | ICD-10-CM | POA: Diagnosis present

## 2011-12-28 DIAGNOSIS — I10 Essential (primary) hypertension: Secondary | ICD-10-CM | POA: Diagnosis present

## 2011-12-28 DIAGNOSIS — R9431 Abnormal electrocardiogram [ECG] [EKG]: Secondary | ICD-10-CM

## 2011-12-28 DIAGNOSIS — I498 Other specified cardiac arrhythmias: Secondary | ICD-10-CM | POA: Insufficient documentation

## 2011-12-28 DIAGNOSIS — I2699 Other pulmonary embolism without acute cor pulmonale: Secondary | ICD-10-CM | POA: Insufficient documentation

## 2011-12-28 DIAGNOSIS — R0989 Other specified symptoms and signs involving the circulatory and respiratory systems: Secondary | ICD-10-CM

## 2011-12-28 DIAGNOSIS — G4733 Obstructive sleep apnea (adult) (pediatric): Secondary | ICD-10-CM | POA: Insufficient documentation

## 2011-12-28 DIAGNOSIS — R0600 Dyspnea, unspecified: Secondary | ICD-10-CM

## 2011-12-28 DIAGNOSIS — D573 Sickle-cell trait: Secondary | ICD-10-CM | POA: Insufficient documentation

## 2011-12-28 DIAGNOSIS — E0590 Thyrotoxicosis, unspecified without thyrotoxic crisis or storm: Secondary | ICD-10-CM

## 2011-12-28 LAB — BASIC METABOLIC PANEL
BUN: 9 mg/dL (ref 6–23)
CO2: 23 mEq/L (ref 19–32)
Calcium: 9.5 mg/dL (ref 8.4–10.5)
Creatinine, Ser: 0.68 mg/dL (ref 0.50–1.10)

## 2011-12-28 LAB — DIFFERENTIAL
Basophils Relative: 0 % (ref 0–1)
Eosinophils Absolute: 0 10*3/uL (ref 0.0–0.7)
Lymphs Abs: 1.1 10*3/uL (ref 0.7–4.0)
Monocytes Absolute: 0.6 10*3/uL (ref 0.1–1.0)
Neutrophils Relative %: 82 % — ABNORMAL HIGH (ref 43–77)

## 2011-12-28 LAB — CBC
MCH: 22.8 pg — ABNORMAL LOW (ref 26.0–34.0)
MCHC: 34 g/dL (ref 30.0–36.0)
Platelets: 301 10*3/uL (ref 150–400)

## 2011-12-28 LAB — CARDIAC PANEL(CRET KIN+CKTOT+MB+TROPI): CK, MB: 1.1 ng/mL (ref 0.3–4.0)

## 2011-12-28 LAB — POCT I-STAT TROPONIN I: Troponin i, poc: 0 ng/mL (ref 0.00–0.08)

## 2011-12-28 MED ORDER — METHIMAZOLE 10 MG PO TABS
30.0000 mg | ORAL_TABLET | Freq: Every day | ORAL | Status: DC
  Administered 2011-12-29 – 2011-12-31 (×3): 30 mg via ORAL
  Filled 2011-12-28 (×3): qty 3

## 2011-12-28 MED ORDER — SODIUM CHLORIDE 0.9 % IJ SOLN
3.0000 mL | Freq: Two times a day (BID) | INTRAMUSCULAR | Status: DC
  Administered 2011-12-28 – 2011-12-30 (×4): 3 mL via INTRAVENOUS

## 2011-12-28 MED ORDER — HYDROCODONE-ACETAMINOPHEN 5-325 MG PO TABS: 1.0000 | ORAL_TABLET | ORAL | Status: AC | PRN

## 2011-12-28 MED ORDER — ACETAMINOPHEN 325 MG PO TABS
650.0000 mg | ORAL_TABLET | ORAL | Status: DC | PRN
  Administered 2011-12-29: 650 mg via ORAL
  Filled 2011-12-28: qty 2

## 2011-12-28 MED ORDER — GI COCKTAIL ~~LOC~~
30.0000 mL | Freq: Once | ORAL | Status: AC
  Administered 2011-12-28: 30 mL via ORAL
  Filled 2011-12-28: qty 30

## 2011-12-28 MED ORDER — HYDROCODONE-ACETAMINOPHEN 5-325 MG PO TABS: 1.0000 | ORAL_TABLET | ORAL | Status: DC | PRN

## 2011-12-28 MED ORDER — SODIUM CHLORIDE 0.9 % IJ SOLN: 3.0000 mL | INTRAMUSCULAR | Status: DC | PRN

## 2011-12-28 MED ORDER — METOPROLOL SUCCINATE ER 50 MG PO TB24
50.0000 mg | ORAL_TABLET | Freq: Two times a day (BID) | ORAL | Status: DC
  Administered 2011-12-28 – 2011-12-31 (×6): 50 mg via ORAL
  Filled 2011-12-28 (×8): qty 1

## 2011-12-28 MED ORDER — ZOLPIDEM TARTRATE 5 MG PO TABS: 10.0000 mg | ORAL_TABLET | Freq: Every evening | ORAL | Status: DC | PRN

## 2011-12-28 MED ORDER — ENOXAPARIN SODIUM 40 MG/0.4ML ~~LOC~~ SOLN: 40.0000 mg | SUBCUTANEOUS | Status: DC

## 2011-12-28 MED ORDER — OXYCODONE-ACETAMINOPHEN 5-325 MG PO TABS
2.0000 | ORAL_TABLET | Freq: Once | ORAL | Status: AC
  Administered 2011-12-28: 2 via ORAL
  Filled 2011-12-28: qty 2

## 2011-12-28 MED ORDER — LORAZEPAM 0.5 MG PO TABS
0.2500 mg | ORAL_TABLET | Freq: Every day | ORAL | Status: DC | PRN
  Administered 2011-12-28: 0.25 mg via ORAL
  Administered 2011-12-30 (×2): 0.5 mg via ORAL
  Filled 2011-12-28 (×3): qty 1

## 2011-12-28 MED ORDER — SODIUM CHLORIDE 0.9 % IV SOLN: 250.0000 mL | INTRAVENOUS | Status: DC | PRN

## 2011-12-28 MED ORDER — ALUM & MAG HYDROXIDE-SIMETH 200-200-20 MG/5ML PO SUSP
30.0000 mL | ORAL | Status: DC | PRN
  Administered 2011-12-30: 30 mL via ORAL
  Filled 2011-12-28: qty 30

## 2011-12-28 MED ORDER — ASPIRIN 81 MG PO CHEW
81.0000 mg | CHEWABLE_TABLET | Freq: Every day | ORAL | Status: DC
Start: 2011-12-29 — End: 2011-12-31
  Administered 2011-12-29 – 2011-12-31 (×3): 81 mg via ORAL
  Filled 2011-12-28 (×3): qty 1

## 2011-12-28 MED ORDER — ONDANSETRON HCL 4 MG/2ML IJ SOLN: 4.0000 mg | Freq: Four times a day (QID) | INTRAMUSCULAR | Status: DC | PRN

## 2011-12-28 MED ORDER — MAGIC MOUTHWASH
10.0000 mL | ORAL | Status: DC
  Administered 2011-12-28 – 2011-12-31 (×5): 10 mL via ORAL
  Filled 2011-12-28 (×42): qty 10

## 2011-12-28 MED ORDER — AMLODIPINE BESYLATE 10 MG PO TABS
10.0000 mg | ORAL_TABLET | Freq: Every day | ORAL | Status: DC
  Administered 2011-12-29 – 2011-12-31 (×3): 10 mg via ORAL
  Filled 2011-12-28 (×3): qty 1

## 2011-12-28 MED ORDER — NITROGLYCERIN 0.4 MG SL SUBL
0.4000 mg | SUBLINGUAL_TABLET | SUBLINGUAL | Status: DC | PRN
Start: 2011-12-28 — End: 2011-12-31
  Administered 2011-12-29 (×3): 0.4 mg via SUBLINGUAL
  Filled 2011-12-28: qty 25

## 2011-12-28 NOTE — ED Provider Notes (Signed)
Pt seen by [facility] cardiology who plans to admit for rule out and stress testing. PT has been resting in the ED.   Jamie Uplinger B. Bernette Mayers, MD 12/28/11 2010

## 2011-12-28 NOTE — ED Notes (Signed)
Pt to radiology.

## 2011-12-28 NOTE — ED Provider Notes (Signed)
History     CSN: [id number]  Arrival date & time 12/28/11  1136   First MD Initiated Contact with Patient 12/28/11 1139      Chief Complaint  Patient presents with  . Shortness of Breath    (Consider location/radiation/quality/duration/timing/severity/associated sxs/prior treatment) HPI History provided by pt and prior chart.    Pt reports that she has had mild exertional SOB as well as intermittent, mild chest tightness since being discharged.  Symptoms worsened this morning.  She became very dyspneic and experienced diffuse chest pressure, described as something sitting on her chest, with minimal exertion.  Associated w/ nausea. These are the same symptoms that prompted her to seek medical attention at time of last admission.  She has had a cough productive of green phlegm for the past 3 days.  Denies fever, abdominal pain, peripheral edema and calf pain.    Past Medical History  Diagnosis Date  . Tachycardia   . Hypertension   . Sickle cell trait   . Uveitis   . Angina   . Pulmonary embolism on left 12/02/11  . Sleep apnea   . Hyperthyroidism   . Headache   . Anxiety   . PTSD (post-traumatic stress disorder)   . Shortness of breath     "w/heart racing"  . SVT (supraventricular tachycardia)   . Tachyarrhythmia 12/15/11    tachycardia  . Prolonged QT interval     Past Surgical History  Procedure Date  . Knee arthroscopy w/ internal fixation tibial spine fracture ~ 2011    right    Family History  Problem Relation Age of Onset  . Stroke Mother   . Cancer Mother   . Hypertension Mother   . Sarcoidosis Mother   . Stroke Father   . Hypertension Father   . Hyperlipidemia Father     History  Substance Use Topics  . Smoking status: Never Smoker   . Smokeless tobacco: Never Used  . Alcohol Use: Yes     12/15/11 "glass of wine maybe 3 times/month"    OB History    Grav Para Term Preterm Abortions TAB SAB Ect Mult Living                  Review of Systems    All other systems reviewed and are negative.    Allergies  Review of patient's allergies indicates no known allergies.  Home Medications   Current Outpatient Rx  Name Route Sig Dispense Refill  . MAGIC MOUTHWASH Oral Take 10 mLs by mouth every 2 (two) hours. Swish and spit    . AMLODIPINE BESYLATE 10 MG PO TABS Oral Take 10 mg by mouth daily.    . ASPIRIN 81 MG PO CHEW Oral Chew 1 tablet (81 mg total) by mouth daily. 30 tablet 12  . LORAZEPAM 0.5 MG PO TABS Oral Take 0.25-0.5 mg by mouth daily as needed. For anxiety.    . METHIMAZOLE 15 MG PO TABS Oral Take 2 tablets (30 mg total) by mouth daily. 12 tablet 0  . METOPROLOL SUCCINATE ER 50 MG PO TB24 Oral Take 1 tablet (50 mg total) by mouth 2 (two) times daily. Take with or immediately following a meal. 60 tablet 0  . HYDROCODONE-ACETAMINOPHEN 5-325 MG PO TABS Oral Take 1 tablet by mouth every 4 (four) hours as needed. pain      BP 131/79  Pulse 91  Temp(Src) 98.3 F (36.8 C) (Oral)  Resp 22  SpO2 100%  LMP 12/09/2011  Physical Exam  Nursing note and vitals reviewed. Constitutional: She is oriented to person, place, and time. She appears well-developed and well-nourished. No distress.  HENT:  Head: Normocephalic and atraumatic.  Eyes:       Normal appearance  Neck: Normal range of motion.  Cardiovascular: Normal rate, regular rhythm and intact distal pulses.   Pulmonary/Chest: Effort normal and breath sounds normal. No respiratory distress. She exhibits no tenderness.       No pleuritic pain reported  Abdominal: Soft. Bowel sounds are normal. She exhibits no distension. There is no tenderness. There is no guarding.  Musculoskeletal: Normal range of motion.       No peripheral edema or calf tenderness  Neurological: She is alert and oriented to person, place, and time.  Skin: Skin is warm and dry. No rash noted.  Psychiatric: She has a normal mood and affect. Her behavior is normal.    ED Course  Procedures (including  critical care time)   Date: 12/28/2011  Rate: 89  Rhythm: normal sinus rhythm  QRS Axis: normal  Intervals: normal  ST/T Wave abnormalities: nonspecific T wave changes  Conduction Disutrbances:none  Narrative Interpretation:   Old EKG Reviewed: unchanged   Labs Reviewed  CBC - Abnormal; Notable for the following:    RBC 5.66 (*)    MCV 67.0 (*)    MCH 22.8 (*)    All other components within normal limits  DIFFERENTIAL - Abnormal; Notable for the following:    Neutrophils Relative 82 (*)    All other components within normal limits  BASIC METABOLIC PANEL - Abnormal; Notable for the following:    Glucose, Bld 106 (*)    All other components within normal limits  POCT I-STAT TROPONIN I   Dg Chest 2 View  12/28/2011  *RADIOLOGY REPORT*  Clinical Data: Mid chest pain today  CHEST - 2 VIEW  Comparison: Chest x-ray of 12/15/2011  Findings: The lungs are clear.  Mediastinal contours appear normal. The heart is within normal limits in size.  No bony abnormality is seen.  IMPRESSION: No active lung disease.  Original Report Authenticated By: Juline Patch, M.D.     1. Chest pain       MDM  [phone] F w/ h/o hyperthyroidism and SVT and w/ recent admission and cardiology/pulmonology consults for exertional CP/SOB, presents w/ a recurrence of sx since waking this morning.  Was unable to walk d/t severity of dyspnea.  Currently CP free and no acute findings on exam.  EKG unchanged from prior, CXR neg and labs unremarkable.  Pt ambulated and did not drop her sats nor become dsypneic, but she reported chest pain.  Consulted [facility] for admission and Dr. Gonzella Lex says that patient has already been thoroughly evaluated for her symptoms and there is nothing more they can do for her.   Pt then reported that her O2 sat was 79% and systolic BP 181 when EMS arrived at her home.  She has a f/u appt scheduled w/ [facility] in 3 days but she is afraid to go home. I contacted [facility] and they have  agreed to see her in consult.  Dr. Bernette Mayers to dispo.  4:19 PM        Otilio Miu, [place] 12/28/11 [phone]

## 2011-12-28 NOTE — H&P (Signed)
Cardiology Admission Note   Patient ID: Jamie Barajas MRN: [id number], DOB/AGE: [birth year]   Admit date: 12/28/2011 Date of Consult: 12/28/2011  Primary Physician: Angelica Chessman., MD, MD Primary Cardiologist: Jerral Bonito, MD  Pt. Profile: Jamie Barajas is a 41yo female with PMHx significant for HTN, history of SVT (2011, treated with adenosine, BB), history of questionable PE (04/13 after a long car ride, placed on Xarelto - subsequent chest CT review not conclusive for PE), hyperthyroidism, OSA and anxiety/PTSD who presents to [facility] ED today with chest pain.   Of note, the patient was recently discharged from the hospital with chest pain. A repeat CTA of the chest revealed no residual clot. Pulmonology reviewed this with the radiologist as well and confirmed no evidence of PE. Xarelto was stopped. She was ruled out from a cardiac standpoint (negative cardiac biomarkers). Her chest pain was felt to be multifactorial, and due to non-cardiac and non-pulmonary causes. 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 There was evidence of sinus tachycardia, but not SVT on her prior admission. Toprol-XL was continued. Her SVT was thought to be attributed to her hyperthyroidism. Stress testing was considered, but not performed based on chart review.  Stress echo in 2010: There was no chest pain or EKG change with stress. The echo images at rest reveal normal wall motion with an EF of 60%. With stress there is increase in thickening and contractility in all segments. There is decrease in cavity size in all views. This is a normal stress echo study. There is no ischemia.   2D ECHO 12/16/11: LVEF 55-60%; no WMAs, no diastolic dysfunction, PA pressures unable to be estimated, normal RV size and systolic function.   Reason for consult: Evaluation/management of chest pain  Problem List: Past Medical History  Diagnosis Date  . Hypertension   . Sickle cell trait   .  Uveitis   . Pulmonary embolism on left 12/02/11    Questionable diagnosis based on chart review  . Sleep apnea   . Hyperthyroidism   . Headache   . Anxiety   . PTSD (post-traumatic stress disorder)   . Shortness of breath   . SVT (supraventricular tachycardia)   . Prolonged QT interval     Past Surgical History  Procedure Date  . Knee arthroscopy w/ internal fixation tibial spine fracture ~ 2011    Rght     Allergies: No Known Allergies  HPI:   She saw her endocrinologist approximately 3 days and was advised to continue her methimazole and beat blocker. No recent high altitudes. Her mother had pulmonary sarcoidosis. She has been worked up for sarcoidosis, lupus and MS by report.  Her chest pain is similar to her prior episode, although without any palpitations. She awoke this morning, went to the bathroom, and felt chest discomfort described as "pressure," constant and worsened over the course of hours. She reports associated shortness of breath, PND, lightheadedness, decreased activity level, increase DOE. She took her BP and reported it as 120/82, EMS checked it and found it to be SBP 180s. She states her pulse increased as well. She denies lightheadedness, syncope, LEE. She notes a cough productive of green phlegm. She reports diarrhea and increased urinary frequency; denies n/v/fevers/chills. She denies increased stress. She reports extended travel (but stopped after an hour to stretch). She reports medication compliance.   Home Medications: Prior to Admission medications   Medication Sig Start Date End Date Taking? Authorizing Provider  Alum [employer] Mag  Hydroxide-Simeth (MAGIC MOUTHWASH) SOLN Take 10 mLs by mouth every 2 (two) hours. Swish and spit   Yes Historical Provider, MD  amLODipine (NORVASC) 10 MG tablet Take 10 mg by mouth daily.   Yes Historical Provider, MD  aspirin 81 MG chewable tablet Chew 1 tablet (81 mg total) by mouth daily. 12/20/11 12/19/12 Yes Rhetta Mura, MD    LORazepam (ATIVAN) 0.5 MG tablet Take 0.25-0.5 mg by mouth daily as needed. For anxiety.   Yes Historical Provider, MD  methimazole 15 MG TABS Take 2 tablets (30 mg total) by mouth daily. 12/20/11 12/19/12 Yes Rhetta Mura, MD  metoprolol succinate (TOPROL-XL) 50 MG 24 hr tablet Take 1 tablet (50 mg total) by mouth 2 (two) times daily. Take with or immediately following a meal. 12/20/11 12/19/12 Yes Rhetta Mura, MD  HYDROcodone-acetaminophen (NORCO) 5-325 MG per tablet Take 1 tablet by mouth every 4 (four) hours as needed. pain 12/20/11 12/28/11  Rhetta Mura, MD  HYDROcodone-acetaminophen (NORCO) 5-325 MG per tablet Take 1 tablet by mouth every 4 (four) hours as needed for pain. 12/28/11 01/07/12  Otilio Miu, PA    Inpatient Medications:     . gi cocktail  30 mL Oral Once  . oxyCODONE-acetaminophen  2 tablet Oral Once    (Not in a hospital admission)  Family History  Problem Relation Age of Onset  . Stroke Mother   . Cancer Mother   . Hypertension Mother   . Sarcoidosis Mother   . Stroke Father   . Hypertension Father   . Hyperlipidemia Father      History   Social History  . Marital Status: Married    Spouse Name: N/A    Number of Children: 2  . Years of Education: N/A   Occupational History  . TEACHER    Social History Main Topics  . Smoking status: Never Smoker   . Smokeless tobacco: Never Used  . Alcohol Use: Yes     12/15/11 "glass of wine maybe 3 times/month"  . Drug Use: No  . Sexually Active: Yes   Other Topics Concern  . Not on file   Social History Narrative   Lives with husband in [place], [place].      Review of Systems: General: negative for chills, fever, night sweats or weight changes.  Cardiovascular: positive for chest pain, lightheadedness, dyspnea on exertion, PND and shortness of breath, negative for edema, palpitations, paroxysmal nocturnal dyspnea or shortness of breath Dermatological: negative for rash Respiratory:  positive for productive cough, negative for wheezing Urologic: negative for hematuria Abdominal: positive for diarrhea, negative for nausea, vomiting, bright red blood per rectum, melena, or hematemesis Neurologic: negative for visual changes, syncope, All other systems reviewed and are otherwise negative except as noted above.  Physical Exam: Blood pressure 117/73, pulse 65, temperature 98.3 F (36.8 C), temperature source Oral, resp. rate 18, last menstrual period 12/09/2011, SpO2 100.00%.   General: Well developed, well nourished, somewhat anxious appearing, in no acute distress. Head: Normocephalic, atraumatic, sclera non-icteric, no xanthomas, nares are without discharge.  Neck: Negative for carotid bruits. JVD not elevated. Lungs:  Clear bilaterally to auscultation without wheezes, rales, or rhonchi. Breathing is unlabored. Heart:  RRR with S1 S2. No murmurs, rubs, or gallops appreciated. Abdomen:  Soft, non-tender, non-distended with normoactive bowel sounds. No hepatomegaly. No rebound/guarding. No obvious abdominal masses. Msk:   Strength and tone appears normal for age. Extremities:  No clubbing, cyanosis or edema.  Distal pedal pulses are 2+ and equal bilaterally.  Neuro:  Alert and oriented X 3. Moves all extremities spontaneously. Psych:   Responds to questions appropriately with a normal affect.  Labs: Recent Labs  Basename 12/28/11 1245   WBC 9.2   HGB 12.9   HCT 37.9   MCV 67.0*   PLT 301    Lab 12/28/11 1245  NA 137  K 3.5  CL 103  CO2 23  BUN 9  CREATININE 0.68  CALCIUM 9.5  PROT --  BILITOT --  ALKPHOS --  ALT --  AST --  AMYLASE --  LIPASE --  GLUCOSE 106*    Radiology/Studies: Dg Chest 2 View  12/28/2011  *RADIOLOGY REPORT*  Clinical Data: Mid chest pain today  CHEST - 2 VIEW  Comparison: Chest x-ray of 12/15/2011  Findings: The lungs are clear.  Mediastinal contours appear normal. The heart is within normal limits in size.  No bony abnormality is  seen.  IMPRESSION: No active lung disease.  Original Report Authenticated By: Juline Patch, M.D.   Dg Chest 2 View  12/15/2011  *RADIOLOGY REPORT*  Clinical Data: Chest pain.  CHEST - 2 VIEW  Comparison: 12/02/2011  Findings: Heart and mediastinal contours are within normal limits. No focal opacities or effusions.  No acute bony abnormality.  IMPRESSION: No active cardiopulmonary disease.  Original Report Authenticated By: Cyndie Chime, M.D.   Dg Chest 2 View  12/02/2011  *RADIOLOGY REPORT*  Clinical Data: Chest pain, shortness of breath, left arm numbness  CHEST - 2 VIEW  Comparison: Chest x-ray of 09/17/2009  Findings: The lungs are clear.  Mediastinal contours appear normal. The heart is within normal limits in size.  No bony abnormality is seen.  IMPRESSION: No active lung disease.  Original Report Authenticated By: Juline Patch, M.D.   Ct Angio Chest W/cm &/or Wo Cm  12/10/2011  *RADIOLOGY REPORT*  Clinical Data: Chest pain and tachycardia beginning 20 minutes ago. Vaginal bleeding.  History of pulmonary embolus on Friday.  CT ANGIOGRAPHY CHEST  Technique:  Multidetector CT imaging of the chest using the standard protocol during bolus administration of intravenous contrast. Multiplanar reconstructed images including MIPs were obtained and reviewed to evaluate the vascular anatomy.  Contrast: OMNIPAQUE IOHEXOL 350 MG/ML SOLN  Comparison: 12/02/2011  Findings: Technically adequate study with moderately good opacification of the central and segmental pulmonary arteries.  No focal filling defects demonstrated.  No evidence of significant pulmonary embolus.  The left lower lobe pulmonary arteries are well opacified today without evidence of any significant residual pulmonary embolus.  Normal heart size.  Normal caliber thoracic aorta.  No significant lymphadenopathy in the chest.  Esophagus is decompressed.  The small esophageal hiatal hernia.  Visualized portions of the upper abdominal organs are  unremarkable.  No pleural effusion.  No focal airspace consolidation in the lungs.  No significant interstitial infiltration.  Airways appear patent.  Normal alignment of the thoracic vertebrae.  IMPRESSION: No significant residual pulmonary embolus.  No evidence of active consolidation in the lungs.  Original Report Authenticated By: Marlon Pel, M.D.   Ct Angio Chest W/cm &/or Wo Cm  12/17/2011  **ADDENDUM** CREATED: 12/17/2011 13:16:26  Discussed case with Dr. Cathie Hoops.  Additional history provided, the patient having hyperthyroidism with hyperthyroid symptoms, shortness of breath, and palpitations, as well as recurrent identical symptoms 1 week later despite anticoagulation. The patient has had an interval negative lower extremity Doppler exam and a negative follow-up CTA chest study. The patient has had menorrhagia since beginning in  relation.  After further review of the initial CTA exam of 12/02/2011, the area of abnormal attenuation in the left lower lobe pulmonary artery may represent a motion artifact related to cardiac pulsation or minimal respiratory motion.  This is not a definite well-defined filling defect.  While not representing normal pulmonary arterial opacification, the identified left lower lobe pulmonary arterial abnormality may represent an artifact rather than a small pulmonary embolus.  **END ADDENDUM** SIGNED BY: Loraine Leriche A. Tyron Russell, M.D.    12/02/2011  *RADIOLOGY REPORT*  Clinical Data: Chest pain, history of hyperthyroidism, hypertension, tachycardia  CT ANGIOGRAPHY CHEST  Technique:  Multidetector CT imaging of the chest using the standard protocol during bolus administration of intravenous contrast. Multiplanar reconstructed images including MIPs were obtained and reviewed to evaluate the vascular anatomy.  Contrast: OMNIPAQUE IOHEXOL 350 MG/ML SOLN  Comparison: None  Findings: Aorta normal caliber without aneurysm or dissection. Respiratory motion artifacts slightly degrade  assessment of lower lobe pulmonary arteries. However, an irregular filling defect is identified in a left lower lobe pulmonary artery consistent with pulmonary embolism. No other pulmonary emboli are visualized. No thoracic adenopathy. Visualized portion of upper abdomen normal appearance. Lungs clear. No pleural effusion or pneumothorax. No acute osseous findings.  IMPRESSION: Irregular filling defect in a left lower lobe pulmonary artery consistent with pulmonary embolism. No other definite intrathoracic abnormalities identified.  Critical Value/emergent results were called by telephone at the time of interpretation on 12/02/2011  at 1807 hours  to Dr.Yelverton, who verbally acknowledged these results. Original Report Authenticated By: Lollie Marrow, M.D.    EKG: NSR, 89 bpm; questionable Q waves vs poor R wave progression V1, V2; new TW flattening/inversions V2-V6; QTc 521 (515 on 04/30 tracing)  ASSESSMENT:   1. Chest pain - recurrent and progressive, etiology not clear 2. Hyperthyroidism 3. History of PSVT 4. Hypertension 5. History of questionable PE  DISCUSSION/PLAN:   The patient had a work-up on her recent admission of her chest pain. Prior CT was further evaluated by pulmonology and radiology, with no evidence of PE. She ruled out from an ACS standpoint. There were plans to consider [facility], but this was not performed at last assessment. There was apprehension with ordering CT and studies utilizing iodinated contrast agents in the setting of her hyperthyroidism. Lexiscan Myoview does not pose this risk as the radiotracer does not contact iodine. HPI is concerning for ischemic symptoms, and could represent unstable angina. She does have nonspecific EKG changes, but POC troponin was WNL. Will admit for ACS rule-out. Will plan for [place] tomorrow. Will continue all outpatient medications. Will get a D-dimer. Hold off on QT prolonging drugs.   Signed, R. Hurman Horn,  PA-C 12/28/2011, 7:36 PM   Attending note:  Patient seen and examined this evening as on call physician for Dr. Myrtis Ser. Internal medicine service was initially contacted however deferred evaluation to our service. Records and recent testing reviewed, as well as database recorded by Mr. Arguello. She has had recurring chest pressure and DOE since hospital discharge, reportedly much worse today. States she had to "crawl to the door" when EMS arrived due to severity of her symptoms. No cough, hemoptysis, palpitations, syncope. Some associated nausea. Reports compliance with medications and better heart rate control with Toprol XL.  On examination she appears anxious but in NAD. She is afebrile, normotensive, HR in the 70's, and oxygen saturation 100% on [place]. Lungs are clear with nonlabored breathing, cardiac with RRR and no rub or gallop, no  peripheral edema.  Labwork and imaging results reviewed. POC troponin is normal, Hgb 12.9, potassium 3.5, BUN 9, creatinine 0.6, CXR with no active infiltrates or edema, ECG with sinus rhythm and decreased R wave progression, prolonged QT.  Discussed situation with patient and husband. In reviewing records it is noted that testing so far has not pointed to any clear cardiac source for her symptoms. She has not yet undergone formal ischemic testing however (last evaluation was in 2010). In light of the described severity and progression in her symptoms, she is being admitting for further evaluation. Plan to schedule a Lexiscan Myoview for tomorrow if cardiac markers remain normal (not stopping beta blocker now with ongoing treatment for hyperthyroidism, and Myoview is not an iodinated imaging agent). Depending on these results and her clinical progress, our service can determine if further testing is required and if further followup with Dr. Myrtis Ser is needed.  Jonelle Sidle, M.D., F.A.C.C.

## 2011-12-28 NOTE — Discharge Instructions (Signed)
Take vicodin as prescribed for severe pain.   Do not drive within four hours of taking this medication (may cause drowsiness or confusion).  Follow up with your cardiologist as scheduled on Thursday.  You should return to the ER if your pain or shortness of breath worsens. Chest Pain (Nonspecific) It is often hard to give a specific diagnosis for the cause of chest pain. There is always a chance that your pain could be related to something serious, such as a heart attack or a blood clot in the lungs. You need to follow up with your caregiver for further evaluation. CAUSES   Heartburn.   Pneumonia or bronchitis.   Anxiety or stress.   Inflammation around your heart (pericarditis) or lung (pleuritis or pleurisy).   A blood clot in the lung.   A collapsed lung (pneumothorax). It can develop suddenly on its own (spontaneous pneumothorax) or from injury (trauma) to the chest.   Shingles infection (herpes zoster virus).  The chest wall is composed of bones, muscles, and cartilage. Any of these can be the source of the pain.  The bones can be bruised by injury.   The muscles or cartilage can be strained by coughing or overwork.   The cartilage can be affected by inflammation and become sore (costochondritis).  DIAGNOSIS  Lab tests or other studies, such as X-rays, electrocardiography, stress testing, or cardiac imaging, may be needed to find the cause of your pain.  TREATMENT   Treatment depends on what may be causing your chest pain. Treatment may include:   Acid blockers for heartburn.   Anti-inflammatory medicine.   Pain medicine for inflammatory conditions.   Antibiotics if an infection is present.   You may be advised to change lifestyle habits. This includes stopping smoking and avoiding alcohol, caffeine, and chocolate.   You may be advised to keep your head raised (elevated) when sleeping. This reduces the chance of acid going backward from your stomach into your esophagus.     Most of the time, nonspecific chest pain will improve within 2 to 3 days with rest and mild pain medicine.  HOME CARE INSTRUCTIONS   If antibiotics were prescribed, take your antibiotics as directed. Finish them even if you start to feel better.   For the next few days, avoid physical activities that bring on chest pain. Continue physical activities as directed.   Do not smoke.   Avoid drinking alcohol.   Only take over-the-counter or prescription medicine for pain, discomfort, or fever as directed by your caregiver.   Follow your caregiver's suggestions for further testing if your chest pain does not go away.   Keep any follow-up appointments you made. If you do not go to an appointment, you could develop lasting (chronic) problems with pain. If there is any problem keeping an appointment, you must call to reschedule.  SEEK MEDICAL CARE IF:   You think you are having problems from the medicine you are taking. Read your medicine instructions carefully.   Your chest pain does not go away, even after treatment.   You develop a rash with blisters on your chest.  SEEK IMMEDIATE MEDICAL CARE IF:   You have increased chest pain or pain that spreads to your arm, neck, jaw, back, or abdomen.   You develop shortness of breath, an increasing cough, or you are coughing up blood.   You have severe back or abdominal pain, feel nauseous, or vomit.   You develop severe weakness, fainting, or chills.  You have a fever.  THIS IS AN EMERGENCY. Do not wait to see if the pain will go away. Get medical help at once. Call your local emergency services (911 in [place] Do not drive yourself to the hospital. MAKE SURE YOU:   Understand these instructions.   Will watch your condition.   Will get help right away if you are not doing well or get worse.  Document Released: 05/13/2005 Document Revised: 07/23/2011 Document Reviewed: 03/08/2008 [facility] Patient Information 2012 [place], [place].

## 2011-12-28 NOTE — ED Notes (Signed)
Per EMS, Pt with c/o right sided chest pain/pressure.  Onset this AM.  States pt discharged from hospital Sunday for same.  Per EMS, 12 lead EKG within normal limits.  Given 4 Aspirin and 1 NTG SL.  States pain went from 10/10 to 4/10.  #20G placed to right hand.

## 2011-12-29 ENCOUNTER — Inpatient Hospital Stay (HOSPITAL_COMMUNITY): Payer: BC Managed Care – PPO

## 2011-12-29 ENCOUNTER — Other Ambulatory Visit: Payer: Self-pay

## 2011-12-29 DIAGNOSIS — R079 Chest pain, unspecified: Secondary | ICD-10-CM

## 2011-12-29 LAB — CARDIAC PANEL(CRET KIN+CKTOT+MB+TROPI)
CK, MB: 1.2 ng/mL (ref 0.3–4.0)
CK, MB: 1 ng/mL (ref 0.3–4.0)
Total CK: 72 U/L (ref 7–177)
Troponin I: <0.3 ng/mL (ref <0.30)
Troponin I: <0.3 ng/mL (ref <0.30)

## 2011-12-29 LAB — CBC
MCH: 22.4 pg — ABNORMAL LOW (ref 26.0–34.0)
MCV: 66.4 fL — ABNORMAL LOW (ref 78.0–100.0)
Platelets: 277 10*3/uL (ref 150–400)
RDW: 14.5 % (ref 11.5–15.5)

## 2011-12-29 LAB — BASIC METABOLIC PANEL
CO2: 22 mEq/L (ref 19–32)
Calcium: 9.5 mg/dL (ref 8.4–10.5)
Creatinine, Ser: 0.69 mg/dL (ref 0.50–1.10)
GFR calc non Af Amer: >90 mL/min (ref >90)
Glucose, Bld: 92 mg/dL (ref 70–99)

## 2011-12-29 LAB — TSH: TSH: 0.009 u[IU]/mL — ABNORMAL LOW (ref 0.350–4.500)

## 2011-12-29 MED ORDER — MORPHINE SULFATE 2 MG/ML IJ SOLN
2.0000 mg | INTRAMUSCULAR | Status: DC | PRN
  Administered 2011-12-29: 2 mg via INTRAVENOUS
  Filled 2011-12-29: qty 1

## 2011-12-29 MED ORDER — GI COCKTAIL ~~LOC~~
30.0000 mL | Freq: Once | ORAL | Status: AC
  Administered 2011-12-29: 30 mL via ORAL
  Filled 2011-12-29: qty 30

## 2011-12-29 MED ORDER — TECHNETIUM TC 99M TETROFOSMIN IV KIT
30.0000 | PACK | Freq: Once | INTRAVENOUS | Status: AC | PRN
  Administered 2011-12-29: 30 via INTRAVENOUS

## 2011-12-29 MED ORDER — TECHNETIUM TC 99M TETROFOSMIN IV KIT
10.0000 | PACK | Freq: Once | INTRAVENOUS | Status: AC | PRN
  Administered 2011-12-29: 10 via INTRAVENOUS

## 2011-12-29 MED ORDER — PANTOPRAZOLE SODIUM 40 MG PO TBEC
40.0000 mg | DELAYED_RELEASE_TABLET | Freq: Two times a day (BID) | ORAL | Status: DC
  Administered 2011-12-30: 40 mg via ORAL
  Filled 2011-12-29 (×2): qty 1

## 2011-12-29 NOTE — Significant Event (Signed)
Patient c/o substernal, non-radiating CP. Originally 6/10, then 5/10 after NTG SL tabs X 3 at appropriate intervals. BP elevated: 140's/ 90's, ST at 111. EKG with changes from this morning at 0519: ST depression in Lead II, V4, V5, V6.  Called PA who administered stress test this am to inform. Received orders-see order management. Will medicate with Morphine IV and if/when pain subsides get another EKG to check if EKG normalizes. Will monitor closely. Harlow Asa

## 2011-12-29 NOTE — ED Provider Notes (Signed)
Medical screening examination/treatment/procedure(s) were performed by non-physician practitioner and as supervising physician I was immediately available for consultation/collaboration.  Ozzy Bohlken R. Kayelynn Abdou, MD 12/29/11 0705 

## 2011-12-29 NOTE — Progress Notes (Signed)
@   Subjective:  Mild CP earlier today; no SOB   Objective:  Filed Vitals:   12/28/11 2000 12/28/11 2122 12/28/11 2211 12/29/11 0616  BP: 131/73 141/91 127/81 132/82  Pulse: 68 84 75 69  Temp:  98.1 F (36.7 C) 98.4 F (36.9 C) 98.4 F (36.9 C)  TempSrc:  Oral Oral Oral  Resp:  18 16 18   Height:   5\' 4"  (1.626 m)   Weight:   76.4 kg (168 lb 6.9 oz)   SpO2: 100% 100% 97% 98%    Intake/Output from previous day:  Intake/Output Summary (Last 24 hours) at 12/29/11 0825 Last data filed at 12/29/11 0600  Gross per 24 hour  Intake    240 ml  Output    475 ml  Net   -235 ml    Physical Exam: Physical exam: Well-developed well-nourished in no acute distress.  Skin is warm and dry.  HEENT is normal.  Neck is supple.  Chest is clear to auscultation with normal expansion.  Cardiovascular exam is regular rate and rhythm.  Abdominal exam nontender or distended. No masses palpated. Extremities show no edema. neuro grossly intact    Lab Results: Basic Metabolic Panel:  Basename 12/29/11 0430 12/28/11 1245  NA 135 137  K 3.9 3.5  CL 102 103  CO2 22 23  GLUCOSE 92 106*  BUN 7 9  CREATININE 0.69 0.68  CALCIUM 9.5 9.5  MG -- --  PHOS -- --   CBC:  Basename 12/29/11 0430 12/28/11 1245  WBC 7.0 9.2  NEUTROABS -- 7.5  HGB 12.1 12.9  HCT 35.8* 37.9  MCV 66.4* 67.0*  PLT 277 301   Cardiac Enzymes:  Basename 12/29/11 0500 12/28/11 2216  CKTOTAL 58 66  CKMB 1.2 1.1  CKMBINDEX -- --  TROPONINI <0.30 <0.30     Assessment/Plan:  1) chest pain - Enzymes negative; plan myoview today; if negative, DC today and FU with Dr Myrtis Ser 2) H/O SVT-continue beta blocker 3) Hyperthyroid - continue methimazole 4) Hypertension - continue present BP meds   Olga Millers 12/29/2011, 8:25 AM

## 2011-12-29 NOTE — Progress Notes (Signed)
INITIAL ADULT NUTRITION ASSESSMENT Date: 12/29/2011   Time: 2:44 PM Reason for Assessment: Nutrition Risk, unintentional weight loss  ASSESSMENT: Female 41 y.o.  Dx: Chest Pain  Hx:  Past Medical History  Diagnosis Date  . Hypertension   . Sickle cell trait   . Uveitis   . Pulmonary embolism on left 12/02/11    Questionable diagnosis based on chart review  . Sleep apnea   . Hyperthyroidism   . Headache   . Anxiety   . PTSD (post-traumatic stress disorder)   . Shortness of breath   . SVT (supraventricular tachycardia)   . Prolonged QT interval     Related Meds:     . amLODipine  10 mg Oral Daily  . aspirin  81 mg Oral Daily  . magic mouthwash  10 mL Oral Q2H  . methimazole  30 mg Oral Daily  . metoprolol succinate  50 mg Oral BID  . oxyCODONE-acetaminophen  2 tablet Oral Once  . sodium chloride  3 mL Intravenous Q12H  . DISCONTD: enoxaparin  40 mg Subcutaneous Q24H     Ht: 5\' 4"  (162.6 cm)  Wt: 168 lb 6.9 oz (76.4 kg)  Ideal Wt: 54.5 kg % Ideal Wt: 140%  Usual Wt:  Wt Readings from Last 5 Encounters:  12/28/11 168 lb 6.9 oz (76.4 kg)  12/19/11 172 lb 2.9 oz (78.1 kg)  12/02/11 180 lb 8.9 oz (81.9 kg)    % Usual Wt: 93%  Body mass index is 28.91 kg/(m^2). pt is overweight  Food/Nutrition Related Hx: Pt reports weight loss r/t hyperthyroidism  Labs:  CMP     Component Value Date/Time   NA 135 12/29/2011 0430   K 3.9 12/29/2011 0430   CL 102 12/29/2011 0430   CO2 22 12/29/2011 0430   GLUCOSE 92 12/29/2011 0430   BUN 7 12/29/2011 0430   CREATININE 0.69 12/29/2011 0430   CALCIUM 9.5 12/29/2011 0430   PROT 6.8 12/02/2011 1523   ALBUMIN 3.5 12/02/2011 1523   AST 14 12/02/2011 1523   ALT 19 12/02/2011 1523   ALKPHOS 65 12/02/2011 1523   BILITOT 0.3 12/02/2011 1523   GFRNONAA >90 12/29/2011 0430   GFRAA >90 12/29/2011 0430   TSH 0.009 Low   Intake/Output Summary (Last 24 hours) at 12/29/11 1453 Last data filed at 12/29/11 0600  Gross per 24 hour  Intake     240 ml  Output    475 ml  Net   -235 ml     Diet Order: Cardiac, no meals have been documented this admission  Supplements/Tube Feeding: none  IVF:    Estimated Nutritional Needs:   Kcal: 1800-1975  Protein: 80-90 gm Fluid:  1.8-2 L   Pt denies any needs at this time. Stated she thinks her weight loss is r/t hyperthyroidism. Pt is on methimazole. Pt denies any needs at this time.   NUTRITION DIAGNOSIS: Unintentional weight loss  RELATED TO: hyperthyroidism  AS EVIDENCE BY: 12 lbs weight loss in 2 months.   MONITORING/EVALUATION(Goals): Goal: Po intake to maintain weight Monitor: PO intake, weight, labs, I/O's  EDUCATION NEEDS: -No education needs identified at this time  INTERVENTION: 1. Pt denies needs at this time, RD will continue to follow  Dietitian [phone]  DOCUMENTATION CODES Per approved criteria  -Not Applicable    Clarene Duke MARIE 12/29/2011, 2:44 PM

## 2011-12-29 NOTE — Progress Notes (Addendum)
Called by RN because of chest pain. Pt has had intermittent chest pain today, had some during the GXT that resolved with rest. She then had chest pain at rest later on, possibly just before eating. This was unrelieved by 3 SL NTG but was improved by morphine. She also got a GI cocktail. She was then pain-free. She was not sure whether the morphine or the GI cocktail helped more. She is concerned about the recurrent pain. Her ECG shows diffuse ST and T wave changes that are present at rest and are not clearly ischemic. Her stress test showed no scar or ischemia and an EF of 70%. Will therefore add BID PPI and oral pain meds with the goal of keeping her pain-free and discharge in am if she remains stable overnight.   Spoke with radiologist who reviewed the CT scan from 4/25. He was able to evaluate the aortic root and did not see any dilatation. The coronary arteries were not well-seen but there was no obvious calcification on the coronary arteries.

## 2011-12-30 ENCOUNTER — Inpatient Hospital Stay (HOSPITAL_COMMUNITY): Payer: BC Managed Care – PPO

## 2011-12-30 DIAGNOSIS — I498 Other specified cardiac arrhythmias: Secondary | ICD-10-CM

## 2011-12-30 MED ORDER — LEVALBUTEROL HCL 0.63 MG/3ML IN NEBU
0.6300 mg | INHALATION_SOLUTION | Freq: Four times a day (QID) | RESPIRATORY_TRACT | Status: DC
  Administered 2011-12-30 (×2): 0.63 mg via RESPIRATORY_TRACT
  Filled 2011-12-30 (×4): qty 3

## 2011-12-30 MED ORDER — LEVALBUTEROL HCL 0.63 MG/3ML IN NEBU
0.6300 mg | INHALATION_SOLUTION | RESPIRATORY_TRACT | Status: DC | PRN
  Filled 2011-12-30: qty 3

## 2011-12-30 MED ORDER — LEVALBUTEROL HCL 0.63 MG/3ML IN NEBU
0.6300 mg | INHALATION_SOLUTION | Freq: Two times a day (BID) | RESPIRATORY_TRACT | Status: DC
  Administered 2011-12-31: 0.63 mg via RESPIRATORY_TRACT
  Filled 2011-12-30 (×3): qty 3

## 2011-12-30 NOTE — Progress Notes (Signed)
Patient ID: Jamie Barajas, female   DOB: May 09, 1971, 41 y.o.   MRN: [id number]   This AM patient still complains of intermittant exertional SOB. Also, She feels she has coughed up small amount of  ?brown blood tinging in sputum?  I have asked pulmonary to review her status completely today.   Nuclear stress is normal. We will not consider cath now because of iodine / hyperthyroid.  I will enter more complete note later today.  Jerral Bonito, MD
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
## 2011-12-30 NOTE — Progress Notes (Signed)
Patient ID: Jamie Barajas, female   DOB: 1971/04/01, 41 y.o.   MRN: [id number]   SUBJECTIVE:  The patient had a pharmacologic stress nuclear scan yesterday. There was no scar or ischemia. Wall motion was normal. She had some chest discomfort yesterday in the evening. The etiology is not clear. I feel it is not cardiac. I have considered whether we have to try to proceed with cardiac catheterization. This would not be optimal for her with the iodine load needed. I feel it is not indicated at this point.  The patient had some nausea this morning. Etiology is not clear. She also coughed a small amount of mucus. There was question if it could be slightly blood-tinged. I have consulted the pulmonary team. There is already an excellent comprehensive note on the chart.  In reviewing the patient's heart rate pattern, she continues to have increased heart rate with very modest ambulation. I carefully reviewed the strips and it is definitely sinus tachycardia. She is on a beta blocker. I suspect that her heart rate response is related to her hyperthyroidism. The beta blocker may make her feel poorly in general, but it seems necessary because her heart rate response.   The patient's endocrinologist will be coming to see her today to help with further approach to her thyroid status.   Filed Vitals:   12/29/11 1358 12/29/11 1857 12/29/11 1943 12/30/11 0413  BP: 149/90 140/86 139/92 121/86  Pulse:  92 88 78  Temp:  99.1 F (37.3 C) 98.6 F (37 C) 98.2 F (36.8 C)  TempSrc:  Oral Oral Oral  Resp:  16 16 16   Height:      Weight:      SpO2:  98% 100% 100%    Intake/Output Summary (Last 24 hours) at 12/30/11 1239 Last data filed at 12/30/11 0815  Gross per 24 hour  Intake    540 ml  Output      0 ml  Net    540 ml    LABS: Basic Metabolic Panel:  Basename 12/29/11 0430 12/28/11 1245  NA 135 137  K 3.9 3.5  CL 102 103  CO2 22 23  GLUCOSE 92 106*  BUN 7 9  CREATININE 0.69 0.68  CALCIUM  9.5 9.5  MG -- --  PHOS -- --   Liver Function Tests: No results found for this basename: AST:2,ALT:2,ALKPHOS:2,BILITOT:2,PROT:2,ALBUMIN:2 in the last 72 hours No results found for this basename: LIPASE:2,AMYLASE:2 in the last 72 hours CBC:  Basename 12/29/11 0430 12/28/11 1245  WBC 7.0 9.2  NEUTROABS -- 7.5  HGB 12.1 12.9  HCT 35.8* 37.9  MCV 66.4* 67.0*  PLT 277 301   Cardiac Enzymes:  Basename 12/29/11 1356 12/29/11 0500 12/28/11 2216  CKTOTAL 72 58 66  CKMB 1.0 1.2 1.1  CKMBINDEX -- -- --  TROPONINI <0.30 <0.30 <0.30   BNP: No components found with this basename: POCBNP:3 D-Dimer: No results found for this basename: DDIMER:2 in the last 72 hours Hemoglobin A1C: No results found for this basename: HGBA1C in the last 72 hours Fasting Lipid Panel: No results found for this basename: CHOL,HDL,LDLCALC,TRIG,CHOLHDL,LDLDIRECT in the last 72 hours Thyroid Function Tests:  Basename 12/28/11 2216  TSH 0.009*  T4TOTAL --  T3FREE --  THYROIDAB --    RADIOLOGY: Dg Chest 2 View  12/28/2011  *RADIOLOGY REPORT*  Clinical Data: Mid chest pain today  CHEST - 2 VIEW  Comparison: Chest x-ray of 12/15/2011  Findings: The lungs are clear.  Mediastinal contours appear normal.  The heart is within normal limits in size.  No bony abnormality is seen.  IMPRESSION: No active lung disease.  Original Report Authenticated By: Juline Patch, M.D.   Dg Chest 2 View  12/15/2011  *RADIOLOGY REPORT*  Clinical Data: Chest pain.  CHEST - 2 VIEW  Comparison: 12/02/2011  Findings: Heart and mediastinal contours are within normal limits. No focal opacities or effusions.  No acute bony abnormality.  IMPRESSION: No active cardiopulmonary disease.  Original Report Authenticated By: Cyndie Chime, M.D.   Dg Chest 2 View  12/02/2011  *RADIOLOGY REPORT*  Clinical Data: Chest pain, shortness of breath, left arm numbness  CHEST - 2 VIEW  Comparison: Chest x-ray of 09/17/2009  Findings: The lungs are clear.   Mediastinal contours appear normal. The heart is within normal limits in size.  No bony abnormality is seen.  IMPRESSION: No active lung disease.  Original Report Authenticated By: Juline Patch, M.D.   Ct Angio Chest W/cm &/or Wo Cm  12/10/2011  *RADIOLOGY REPORT*  Clinical Data: Chest pain and tachycardia beginning 20 minutes ago. Vaginal bleeding.  History of pulmonary embolus on Friday.  CT ANGIOGRAPHY CHEST  Technique:  Multidetector CT imaging of the chest using the standard protocol during bolus administration of intravenous contrast. Multiplanar reconstructed images including MIPs were obtained and reviewed to evaluate the vascular anatomy.  Contrast: OMNIPAQUE IOHEXOL 350 MG/ML SOLN  Comparison: 12/02/2011  Findings: Technically adequate study with moderately good opacification of the central and segmental pulmonary arteries.  No focal filling defects demonstrated.  No evidence of significant pulmonary embolus.  The left lower lobe pulmonary arteries are well opacified today without evidence of any significant residual pulmonary embolus.  Normal heart size.  Normal caliber thoracic aorta.  No significant lymphadenopathy in the chest.  Esophagus is decompressed.  The small esophageal hiatal hernia.  Visualized portions of the upper abdominal organs are unremarkable.  No pleural effusion.  No focal airspace consolidation in the lungs.  No significant interstitial infiltration.  Airways appear patent.  Normal alignment of the thoracic vertebrae.  IMPRESSION: No significant residual pulmonary embolus.  No evidence of active consolidation in the lungs.  Original Report Authenticated By: Marlon Pel, M.D.   Ct Angio Chest W/cm &/or Wo Cm  12/17/2011  **ADDENDUM** CREATED: 12/17/2011 13:16:26  Discussed case with Dr. Cathie Hoops.  Additional history provided, the patient having hyperthyroidism with hyperthyroid symptoms, shortness of breath, and palpitations, as well as recurrent identical symptoms 1  week later despite anticoagulation. The patient has had an interval negative lower extremity Doppler exam and a negative follow-up CTA chest study. The patient has had menorrhagia since beginning in relation.  After further review of the initial CTA exam of 12/02/2011, the area of abnormal attenuation in the left lower lobe pulmonary artery may represent a motion artifact related to cardiac pulsation or minimal respiratory motion.  This is not a definite well-defined filling defect.  While not representing normal pulmonary arterial opacification, the identified left lower lobe pulmonary arterial abnormality may represent an artifact rather than a small pulmonary embolus.  **END ADDENDUM** SIGNED BY: Loraine Leriche A. Tyron Russell, M.D.    12/02/2011  *RADIOLOGY REPORT*  Clinical Data: Chest pain, history of hyperthyroidism, hypertension, tachycardia  CT ANGIOGRAPHY CHEST  Technique:  Multidetector CT imaging of the chest using the standard protocol during bolus administration of intravenous contrast. Multiplanar reconstructed images including MIPs were obtained and reviewed to evaluate the vascular anatomy.  Contrast: OMNIPAQUE IOHEXOL 350 MG/ML SOLN  Comparison: None  Findings: Aorta normal caliber without aneurysm or dissection. Respiratory motion artifacts slightly degrade assessment of lower lobe pulmonary arteries. However, an irregular filling defect is identified in a left lower lobe pulmonary artery consistent with pulmonary embolism. No other pulmonary emboli are visualized. No thoracic adenopathy. Visualized portion of upper abdomen normal appearance. Lungs clear. No pleural effusion or pneumothorax. No acute osseous findings.  IMPRESSION: Irregular filling defect in a left lower lobe pulmonary artery consistent with pulmonary embolism. No other definite intrathoracic abnormalities identified.  Critical Value/emergent results were called by telephone at the time of interpretation on 12/02/2011  at 1807 hours  to  Dr.Yelverton, who verbally acknowledged these results. Original Report Authenticated By: Lollie Marrow, M.D.   Nm Myocar Multi W/spect W/wall Motion / Ef  12/29/2011  *RADIOLOGY REPORT*  Clinical Data:  Chest pain.  MYOCARDIAL IMAGING WITH SPECT (REST AND PHARMACOLOGIC-STRESS) GATED LEFT VENTRICULAR WALL MOTION STUDY LEFT VENTRICULAR EJECTION FRACTION  Technique:  Standard myocardial SPECT imaging was performed after resting intravenous injection of 10 mCi Tc-62m tetrofosmin. Subsequently, intravenous infusion of regadenoson was performed under the supervision of the Cardiology staff.  At peak effect of the drug, 30 mCi Tc-95m tetrofosmin was injected intravenously and standard myocardial SPECT  imaging was performed.  Quantitative gated imaging was also performed to evaluate left ventricular wall motion, and estimate left ventricular ejection fraction.  Comparison:  Plain films of the chest 12/28/2011.  CT chest 12/10/2011.  Findings: No fixed or reversible perfusion defect is identified. Wall motion is normal.  End-diastolic volume is 67 ml and end- systolic volume is 20 ml for an ejection fraction of 70%.  IMPRESSION: Normal exam.  Original Report Authenticated By: Bernadene Bell. D'ALESSIO, M.D.   Dg Chest Portable 1 View  12/30/2011  *RADIOLOGY REPORT*  Clinical Data: Dyspnea  PORTABLE CHEST - 1 VIEW  Comparison: 12/28/2011  Findings: Normal heart size.  Clear lungs.  IMPRESSION: Negative.  Original Report Authenticated By: Donavan Burnet, M.D.    PHYSICAL EXAM  Patient is oriented to person time and place. Affect is normal. Lungs are clear. Respiratory effort is nonlabored. Cardiac exam reveals S1 and S2. There no clicks or significant murmurs. Abdomen is soft. There is no peripheral edema.   TELEMETRY: I personally reviewed telemetry from today and the strips from yesterday. She has sinus rhythm.  ASSESSMENT AND PLAN:   Hypertension    Blood pressures control. No change in therapy.    Hyperthyroidism without goiter    At this point it seems possible that most of her symptoms are still related to her hyperthyroidism. I look forward to further help from the endocrinology team.   Chest pain    I feel the patient's chest pain is not cardiac. Once all of the other issues are resolved she needs to be discharged home area. Her early followup needs to be with her primary physicians and endocrinology.   Sinus tachycardia    The patient has sinus tachycardia with ambulation. I think it is still related to her hyperthyroidism.  Pulmonary function studies have been ordered to be sure that there is no obvious abnormality. These are to be read by pulmonary today. The pulmonary team will see the patient again tomorrow for further input. By that time we will have careful input from endocrinology and pulmonary. I am hopeful that we will have a plan and that the patient can be discharged home. Her chest pain is not cardiac   Willa Rough 12/30/2011 12:39 PM

## 2011-12-30 NOTE — Progress Notes (Signed)
Jamie Barajas  

## 2011-12-30 NOTE — Progress Notes (Signed)
Patient had some indigestion and given Maalox per MD order. She also asked for ginger ale.  Patient stated that the medication was effective.  EKG performed this AM showed NSR. Will continue to monitor.

## 2011-12-30 NOTE — Consult Note (Signed)
Patient: Jamie Barajas DOB: 23-Jan-1971 Date of Admission: 12/28/2011            Pulmonary consult  Date of Consult: 12/30/2011 MD requesting consult:  Myrtis Ser Reason for consult: Dyspnea  HPI - 41 yo female with hx HTN, ?OSA, hyperthyroid, anxiety and previous ?dx of PE 4/13 initially started on Xarelto but d/c after further review of CT and f/u CT showed no clots.  She presented 5/13 with c/o chest pain and DOE.  She was admitted by cardiology to r/o ACS. Cardiac enzymes have been neg x 3 and myoview was normal 5/14.  She was nearing d/c but 5/15 c/o persistent DOE and productive cough and PCCM consulted.   Allergies:  No Known Allergies   PMH: Past Medical History  Diagnosis Date  . Hypertension   . Sickle cell trait   . Uveitis   . Pulmonary embolism on left 12/02/11    Questionable diagnosis based on chart review  . Sleep apnea   . Hyperthyroidism   . Headache   . Anxiety   . PTSD (post-traumatic stress disorder)   . Shortness of breath   . SVT (supraventricular tachycardia)   . Prolonged QT interval     Home meds: Medications Prior to Admission  Medication Sig Dispense Refill  . Alum & Mag Hydroxide-Simeth (MAGIC MOUTHWASH) SOLN Take 10 mLs by mouth every 2 (two) hours. Swish and spit      . amLODipine (NORVASC) 10 MG tablet Take 10 mg by mouth daily.      [name] aspirin 81 MG chewable tablet Chew 1 tablet (81 mg total) by mouth daily.  30 tablet  12  . LORazepam (ATIVAN) 0.5 MG tablet Take 0.25-0.5 mg by mouth daily as needed. For anxiety.      . methimazole 15 MG TABS Take 2 tablets (30 mg total) by mouth daily.  12 tablet  0  . metoprolol succinate (TOPROL-XL) 50 MG 24 hr tablet Take 1 tablet (50 mg total) by mouth 2 (two) times daily. Take with or immediately following a meal.  60 tablet  0  . HYDROcodone-acetaminophen (NORCO) 5-325 MG per tablet Take 1 tablet by mouth every 4 (four) hours as needed. pain         Social Hx: History   Social History  . Marital  Status: Married    Spouse Name: N/A    Number of Children: 2  . Years of Education: N/A   Occupational History  . TEACHER    Social History Main Topics  . Smoking status: Never Smoker   . Smokeless tobacco: Never Used  . Alcohol Use: Yes     12/15/11 "glass of wine maybe 3 times/month"  . Drug Use: No  . Sexually Active: Yes   Other Topics Concern  . Not on file   Social History Narrative   Lives with husband in [place], [place].      Family Hx: Family History  Problem Relation Age of Onset  . Stroke Mother   . Cancer Mother   . Hypertension Mother   . Sarcoidosis Mother   . Stroke Father   . Hypertension Father   . Hyperlipidemia Father      ROS: C/o intermittent DOE.  Very sporadic and may occur with very little activity such as walking to bathroom.  This am has started to have mild cough productive brown/blood tinged sputum. Says she occasionally wakes up suddenly in the middle of the night but is not SOB when this occurs and  this is what she has reported as sleep apnea.  Denies chest pain currently.  Denies leg/calf pain, abd pain, n/v, orthopnea, PND.  All other systems reviewed and were neg.   Filed Vitals:   12/29/11 1358 12/29/11 1857 12/29/11 1943 12/30/11 0413  BP: 149/90 140/86 139/92 121/86  Pulse:  92 88 78  Temp:  99.1 F (37.3 C) 98.6 F (37 C) 98.2 F (36.8 C)  TempSrc:  Oral Oral Oral  Resp:  16 16 16   Height:      Weight:      SpO2:  98% 100% 100%  on RA   chest X-ray Dg Chest 2 View  12/28/2011  *RADIOLOGY REPORT*  Clinical Data: Mid chest pain today  CHEST - 2 VIEW  Comparison: Chest x-ray of 12/15/2011  Findings: The lungs are clear.  Mediastinal contours appear normal. The heart is within normal limits in size.  No bony abnormality is seen.  IMPRESSION: No active lung disease.  Original Report Authenticated By: Juline Patch, M.D.   Nm Myocar Multi W/spect W/wall Motion / Ef  12/29/2011  *RADIOLOGY REPORT*  Clinical Data:  Chest pain.   MYOCARDIAL IMAGING WITH SPECT (REST AND PHARMACOLOGIC-STRESS) GATED LEFT VENTRICULAR WALL MOTION STUDY LEFT VENTRICULAR EJECTION FRACTION  Technique:  Standard myocardial SPECT imaging was performed after resting intravenous injection of 10 mCi Tc-71m tetrofosmin. Subsequently, intravenous infusion of regadenoson was performed under the supervision of the Cardiology staff.  At peak effect of the drug, 30 mCi Tc-48m tetrofosmin was injected intravenously and standard myocardial SPECT  imaging was performed.  Quantitative gated imaging was also performed to evaluate left ventricular wall motion, and estimate left ventricular ejection fraction.  Comparison:  Plain films of the chest 12/28/2011.  CT chest 12/10/2011.  Findings: No fixed or reversible perfusion defect is identified. Wall motion is normal.  End-diastolic volume is 67 ml and end- systolic volume is 20 ml for an ejection fraction of 70%.  IMPRESSION: Normal exam.  Original Report Authenticated By: Bernadene Bell. D'ALESSIO, M.D.     CBC    Component Value Date/Time   WBC 7.0 12/29/2011 0430   RBC 5.39* 12/29/2011 0430   HGB 12.1 12/29/2011 0430   HCT 35.8* 12/29/2011 0430   PLT 277 12/29/2011 0430   MCV 66.4* 12/29/2011 0430   MCH 22.4* 12/29/2011 0430   MCHC 33.8 12/29/2011 0430   RDW 14.5 12/29/2011 0430   LYMPHSABS 1.1 12/28/2011 1245   MONOABS 0.6 12/28/2011 1245   EOSABS 0.0 12/28/2011 1245   BASOSABS 0.0 12/28/2011 1245     BMET    Component Value Date/Time   NA 135 12/29/2011 0430   K 3.9 12/29/2011 0430   CL 102 12/29/2011 0430   CO2 22 12/29/2011 0430   GLUCOSE 92 12/29/2011 0430   BUN 7 12/29/2011 0430   CREATININE 0.69 12/29/2011 0430   CALCIUM 9.5 12/29/2011 0430   GFRNONAA >90 12/29/2011 0430   GFRAA >90 12/29/2011 0430    TSH= 0.009  2D echo 5/1>>> EF 55-60%, trivial mitral regurg, normal systolic and diastolic function, normal RT heart  EXAM: General: pleasant female, NAD in bed Neuro: awake, alert, appropriate Neck- no bruit,  palpable diffuse thyroid CV: s1s2 rrr no m/r/g PULM: resps even non labored on RA, cta GI: abd soft, +bs Extremities: warm and dry no edema, -homans  Reflexes wnl   IMPRESSION/ PLAN:  DOE -- No acute distress.  On RA.  ?cardiac in nature although no ACS with negative enzymes and  neg nuclear study.  Also ?r/t thyrotoxicosis not yet fully controlled.  Never smoker.  Afebrile, no leukocytosis.  Chest pain has been relieved with GI cocktail more so than ntg this admit.  Previous hx ?PE for which she was started on Xarelto, but further review and f/u imagine revealed likely no PE.  This coupled with extremely heavy menses with 2gm hgb drop, xarelto was d/c and pt felt not safe for anticoag at that time.  Does have some diffuse ST changes presented at rest on EKG but per cardiology not ischemic.   Etiology: Most concerned about thyroid contribution, arrythmia during ambulation contributing, metoprolol dose contribution to weakness / SOB on exertion  PLAN -  cxr now, reviewed, wnl Cont methimazole, I have called Dr Evlyn Kanner who is kind enough to come see this pt, appreciate his input on TSH, thyroid contribution and status Assess T3 T4 Halter in future? Per cards PFT to exclude additional etiology, spoke to Dr Delton Coombes , he will read and has clinical information Cont BID PPI O2 as needed Ambulate and check sats CT reviewed x 2, avoid further CT, clinical suspicion low for PE  Mcarthur Rossetti. Tyson Alias, MD, FACP Pgr: [phone] [facility]   Danford Bad, NP 12/30/2011  8:28 AM Pager: [phone]  *Care during the described time interval was provided by me and/or other providers on the critical care team. I have reviewed this patient's available data, including medical history, events of note, physical examination and test results as part of my evaluation.

## 2011-12-30 NOTE — Progress Notes (Signed)
Patient ID: Jamie Barajas, female   DOB: 1971-05-06, 41 y.o.   MRN: [id number] Pt off the floor for testing. Repeat circulating thyroid tests are ordered but not yet available Will check back in the AM and when the data is available  Assunta Curtis MD [facility]

## 2011-12-31 ENCOUNTER — Inpatient Hospital Stay (HOSPITAL_COMMUNITY): Payer: BC Managed Care – PPO

## 2011-12-31 DIAGNOSIS — I10 Essential (primary) hypertension: Secondary | ICD-10-CM

## 2011-12-31 MED ORDER — LEVALBUTEROL TARTRATE 45 MCG/ACT IN AERO: 2.0000 | INHALATION_SPRAY | RESPIRATORY_TRACT | Status: DC | PRN

## 2011-12-31 MED ORDER — PANTOPRAZOLE SODIUM 40 MG PO TBEC: 40.0000 mg | DELAYED_RELEASE_TABLET | Freq: Two times a day (BID) | ORAL | Status: DC

## 2011-12-31 MED ORDER — LEVALBUTEROL TARTRATE 45 MCG/ACT IN AERO
2.0000 | INHALATION_SPRAY | RESPIRATORY_TRACT | Status: DC | PRN
  Filled 2011-12-31 (×2): qty 15

## 2011-12-31 MED ORDER — METOPROLOL SUCCINATE ER 25 MG PO TB24: 25.0000 mg | ORAL_TABLET | Freq: Two times a day (BID) | ORAL | Status: DC
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
## 2011-12-31 NOTE — Progress Notes (Signed)
Pt going home with family.  All meds and instructions reviewed, questions answered.

## 2011-12-31 NOTE — Discharge Summary (Signed)
CARDIOLOGY DISCHARGE SUMMARY   Patient ID: Jamie Barajas MRN: [id number] DOB/AGE: Nov 01, 1970 41 y.o.  Admit date: 12/28/2011 Discharge date: 12/31/2011  Primary Discharge Diagnosis:  Chest pain Secondary Discharge Diagnosis:  Past Medical History  Diagnosis Date  . Hypertension   . Sickle cell trait   . Uveitis   . Pulmonary embolism on left 12/02/11    Questionable diagnosis based on chart review  . Sleep apnea   . Hyperthyroidism   . Headache   . Anxiety   . PTSD (post-traumatic stress disorder)   . Shortness of breath   . SVT (supraventricular tachycardia)   . Prolonged QT interval     Consults: Pulmonology, endocrinology  Procedures: Pulmonary function testing, exercise [facility] Course: Jamie Barajas is a 42 year old female with a history of hyperthyroidism but not coronary artery disease. She had chest pain and shortness of breath. She came to the hospital where she was admitted for further evaluation treatment.  During her previous evaluation for chest pain and shortness of breath she had a CT scan which at first was thought to show a PE. However, on further review they did not fill that PE was present. Her aortic root was not dilated and there was no obvious calcification on the coronary arteries. A 2-D echocardiogram had also previously been performed which showed no significant abnormalities.  Her cardiac enzymes were negative for MI. She had an exercise Myoview on 12/29/2011 and reached her target heart rate. She had some shortness of breath and some chest pain during the test but the test showed no scar or ischemia and an EF of 70%. She continued to have intermittent shortness of breath and chest pain. Her symptoms were possibly improved by a GI cocktail. She was started on twice a day proton pump inhibitor. She was also started on pain control medications. She had a cough productive of some sputum that was possibly blood-tinged. A pulmonary consult  was called.  She was seen by Dr. Tyson Alias who ordered pulmonary function testing. She had very mild airflow obstruction on PFTs but her history was not consistent with asthma. It was felt that her shortness of breath was multifactorial and primarily caused by palpitations and hyperthyroidism. He recommended changing her beta blocker to see if her shortness of breath would improve. He also recommended a when necessary inhaler. Since Dr. Tyson Alias was concerned that her hyperthyroidism was contributing to her symptoms, she was seen by Dr. Evlyn Kanner. Dr. Evlyn Kanner felt that although her TSH was low, her free T4 and total T3 were at acceptable levels. He felt she had gone into remission fairly early and should continue her current dose of methimazole. He also felt that her beta blocker could be contributing to her shortness of breath. Her Toprol XL dose was decreased to 25 gm BID and she is advise of any symptoms.   On 12/31/2011, Jamie Barajas was ambulating without chest pain or SOB. She was seen by Dr Jens Som and Dr Kendrick Fries. She is considered stable for discharge, to follow up as an outpatient.   Labs:  Lab Results  Component Value Date   WBC 7.0 12/29/2011   HGB 12.1 12/29/2011   HCT 35.8* 12/29/2011   MCV 66.4* 12/29/2011   PLT 277 12/29/2011    Lab 12/29/11 0430  NA 135  K 3.9  CL 102  CO2 22  BUN 7  CREATININE 0.69  CALCIUM 9.5  PROT --  BILITOT --  ALKPHOS --  ALT --  AST --  GLUCOSE 92    Basename 12/29/11 1356 12/29/11 0500 12/28/11 2216  CKTOTAL 72 58 66  CKMB 1.0 1.2 1.1  CKMBINDEX -- -- --  TROPONINI <0.30 <0.30 <0.30   Lipid Panel     Component Value Date/Time   CHOL 100 12/16/2011 0535   TRIG 118 12/16/2011 0535   HDL 23* 12/16/2011 0535   CHOLHDL 4.3 12/16/2011 0535   VLDL 24 12/16/2011 0535   LDLCALC 53 12/16/2011 0535   Lab Results  Component Value Date   TSH 0.009* 12/28/2011   T4 FREE 1.39 12/30/2011   T3TOTAL 122.8 12/30/2011   T4TOTAL 7.5 04/23/2009   Radiology: Dg  Chest 2 View 12/31/2011  *RADIOLOGY REPORT*  Clinical Data: Shortness of breath.  CHEST - 2 VIEW  Comparison: 12/30/2011  Findings: Heart and mediastinal contours are within normal limits. No focal opacities or effusions.  No acute bony abnormality.  IMPRESSION: No active cardiopulmonary disease.  Original Report Authenticated By: Cyndie Chime, M.D.   Ct Angio Chest W/cm &/or Wo Cm 12/10/2011  *RADIOLOGY REPORT*  Clinical Data: Chest pain and tachycardia beginning 20 minutes ago. Vaginal bleeding.  History of pulmonary embolus on Friday.  CT ANGIOGRAPHY CHEST  Technique:  Multidetector CT imaging of the chest using the standard protocol during bolus administration of intravenous contrast. Multiplanar reconstructed images including MIPs were obtained and reviewed to evaluate the vascular anatomy.  Contrast: OMNIPAQUE IOHEXOL 350 MG/ML SOLN  Comparison: 12/02/2011  Findings: Technically adequate study with moderately good opacification of the central and segmental pulmonary arteries.  No focal filling defects demonstrated.  No evidence of significant pulmonary embolus.  The left lower lobe pulmonary arteries are well opacified today without evidence of any significant residual pulmonary embolus.  Normal heart size.  Normal caliber thoracic aorta.  No significant lymphadenopathy in the chest.  Esophagus is decompressed.  The small esophageal hiatal hernia.  Visualized portions of the upper abdominal organs are unremarkable.  No pleural effusion.  No focal airspace consolidation in the lungs.  No significant interstitial infiltration.  Airways appear patent.  Normal alignment of the thoracic vertebrae.  IMPRESSION: No significant residual pulmonary embolus.  No evidence of active consolidation in the lungs.  Original Report Authenticated By: Marlon Pel, M.D.   Ct Angio Chest W/cm &/or Wo Cm 12/17/2011  **ADDENDUM** CREATED: 12/17/2011 13:16:26  Discussed case with Dr. Cathie Hoops.  Additional history  provided, the patient having hyperthyroidism with hyperthyroid symptoms, shortness of breath, and palpitations, as well as recurrent identical symptoms 1 week later despite anticoagulation. The patient has had an interval negative lower extremity Doppler exam and a negative follow-up CTA chest study. The patient has had menorrhagia since beginning in relation.  After further review of the initial CTA exam of 12/02/2011, the area of abnormal attenuation in the left lower lobe pulmonary artery may represent a motion artifact related to cardiac pulsation or minimal respiratory motion.  This is not a definite well-defined filling defect.  While not representing normal pulmonary arterial opacification, the identified left lower lobe pulmonary arterial abnormality may represent an artifact rather than a small pulmonary embolus.  **END ADDENDUM** SIGNED BY: Loraine Leriche A. Tyron Russell, M.D.    Nm Myocar Multi W/spect W/wall Motion / Ef 12/29/2011  *RADIOLOGY REPORT*  Clinical Data:  Chest pain.  MYOCARDIAL IMAGING WITH SPECT (REST AND PHARMACOLOGIC-STRESS) GATED LEFT VENTRICULAR WALL MOTION STUDY LEFT VENTRICULAR EJECTION FRACTION  Technique:  Standard myocardial SPECT imaging was performed after resting intravenous injection of 10 mCi Tc-66m tetrofosmin.  Subsequently, intravenous infusion of regadenoson was performed under the supervision of the Cardiology staff.  At peak effect of the drug, 30 mCi Tc-10m tetrofosmin was injected intravenously and standard myocardial SPECT  imaging was performed.  Quantitative gated imaging was also performed to evaluate left ventricular wall motion, and estimate left ventricular ejection fraction.  Comparison:  Plain films of the chest 12/28/2011.  CT chest 12/10/2011.  Findings: No fixed or reversible perfusion defect is identified. Wall motion is normal.  End-diastolic volume is 67 ml and end- systolic volume is 20 ml for an ejection fraction of 70%.  IMPRESSION: Normal exam.  Original Report  Authenticated By: Bernadene Bell. D'ALESSIO, M.D.   Dg Chest Portable 1 View 12/30/2011  *RADIOLOGY REPORT*  Clinical Data: Dyspnea  PORTABLE CHEST - 1 VIEW  Comparison: 12/28/2011  Findings: Normal heart size.  Clear lungs.  IMPRESSION: Negative.  Original Report Authenticated By: Donavan Burnet, M.D.   EKG: 30-Dec-2011 04:23:23  Normal sinus rhythm Vent. rate 76 BPM PR interval 148 Jamie QRS duration 88 Jamie QT/QTc 412/463 Jamie P-R-T axes 62 19 4  FOLLOW UP PLANS AND APPOINTMENTS Discharge Orders    Future Appointments: Provider: Department: Dept Phone: Center:   01/15/2012 8:30 AM Ok Anis, NP [facility] [phone] [facility]   01/18/2012 1:00 PM [facility]   01/19/2012 1:00 PM Mc[facility]Nm 2 [facility]  [facility]     No Known Allergies Medication List  As of 12/31/2011  9:14 AM   TAKE these medications         amLODipine 10 MG tablet   Commonly known as: NORVASC   Take 10 mg by mouth daily.      aspirin 81 MG chewable tablet   Chew 1 tablet (81 mg total) by mouth daily.      HYDROcodone-acetaminophen 5-325 MG per tablet   Commonly known as: NORCO   Take 1 tablet by mouth every 4 (four) hours as needed for pain.      LORazepam 0.5 MG tablet   Commonly known as: ATIVAN   Take 0.25-0.5 mg by mouth daily as needed. For anxiety.      magic mouthwash Soln   Take 10 mLs by mouth every 2 (two) hours. Swish and spit      Methimazole 15 MG Tabs   Take 2 tablets (30 mg total) by mouth daily.      metoprolol succinate 50 MG 24 hr tablet   Commonly known as: TOPROL-XL   Take 1 tablet (50 mg total) by mouth 2 (two) times daily. Take with or immediately following a meal.      pantoprazole 40 MG tablet   Commonly known as: PROTONIX   Take 1 tablet (40 mg total) by mouth 2 (two) times daily.           Follow-up Information    Follow up with Lenord Fellers, MD. (As needed)    Contact information:   [address] [place] [address] [place] [address] [phone]       Follow up with Nicolasa Ducking, NP. (May 31st at 8:30 am)    Contact information:   [address] [place] [address] [phone]          BRING ALL MEDICATIONS WITH YOU TO FOLLOW UP APPOINTMENTS  Time spent with patient to include physician time: 39 min Signed: Theodore Demark 12/31/2011, 9:14 AM Co-Sign MD

## 2011-12-31 NOTE — Progress Notes (Signed)
@   Subjective:  Patient denies CP or SOB   Objective:  Filed Vitals:   12/30/11 2103 12/30/11 2107 12/30/11 2124 12/31/11 0557  BP: 135/101 132/92  125/83  Pulse: 112  112 85  Temp: 98.5 F (36.9 C)   98.6 F (37 C)  TempSrc: Oral   Oral  Resp: 18  20 20   Height:      Weight:      SpO2: 99%   99%    Intake/Output from previous day:  Intake/Output Summary (Last 24 hours) at 12/31/11 7829 Last data filed at 12/30/11 1230  Gross per 24 hour  Intake    660 ml  Output      0 ml  Net    660 ml    Physical Exam: Physical exam: Well-developed well-nourished in no acute distress.  Skin is warm and dry.  HEENT is normal.  Neck is supple.  Chest is clear to auscultation with normal expansion.  Cardiovascular exam is regular rate and rhythm.  Abdominal exam nontender or distended. No masses palpated. Extremities show no edema. neuro grossly intact    Lab Results: Basic Metabolic Panel:  Basename 12/29/11 0430 12/28/11 1245  NA 135 137  K 3.9 3.5  CL 102 103  CO2 22 23  GLUCOSE 92 106*  BUN 7 9  CREATININE 0.69 0.68  CALCIUM 9.5 9.5  MG -- --  PHOS -- --   CBC:  Basename 12/29/11 0430 12/28/11 1245  WBC 7.0 9.2  NEUTROABS -- 7.5  HGB 12.1 12.9  HCT 35.8* 37.9  MCV 66.4* 67.0*  PLT 277 301   Cardiac Enzymes:  Basename 12/29/11 1356 12/29/11 0500 12/28/11 2216  CKTOTAL 72 58 66  CKMB 1.0 1.2 1.1  CKMBINDEX -- -- --  TROPONINI <0.30 <0.30 <0.30     Assessment/Plan:  1) chest pain - Enzymes negative; myoview neg; patient can be DCed from a cardiac standpoint if OK with pulmonary; fu with Dr Myrtis Ser. 2) H/O SVT-continue beta blocker; telemetry reviewed; sinus over last 24 hours 3) Hyperthyroid - continue methimazole; will need fu with endocrinology. 4) Hypertension - continue present BP meds >30 min PA and physician time D2  Olga Millers 12/31/2011, 7:02 AM

## 2011-12-31 NOTE — Discharge Summary (Signed)
See progress notes Osei Anger  

## 2012-01-02 ENCOUNTER — Telehealth: Payer: Self-pay | Admitting: Pulmonary Disease

## 2012-01-02 NOTE — Telephone Encounter (Signed)
Pt in hospital recently and had ?airflow obstruction on pfts Apparently has appt with [place] 5/23, but not documented in d/c summary.  Calls today with congestion, sob.  Because it is a weekend, she will either need to go to urgent care or ER.  She should call office Monday to see if we can see her sooner than 5/23.

## 2012-01-03 ENCOUNTER — Encounter (HOSPITAL_BASED_OUTPATIENT_CLINIC_OR_DEPARTMENT_OTHER): Payer: Self-pay | Admitting: *Deleted

## 2012-01-03 ENCOUNTER — Emergency Department (HOSPITAL_BASED_OUTPATIENT_CLINIC_OR_DEPARTMENT_OTHER)
Admission: EM | Admit: 2012-01-03 | Discharge: 2012-01-03 | Disposition: A | Discharge status: Home / Self Care | Payer: BC Managed Care – PPO | Attending: Emergency Medicine | Admitting: Emergency Medicine

## 2012-01-03 DIAGNOSIS — T7840XA Allergy, unspecified, initial encounter: Secondary | ICD-10-CM

## 2012-01-03 DIAGNOSIS — F4310 Post-traumatic stress disorder, unspecified: Secondary | ICD-10-CM | POA: Insufficient documentation

## 2012-01-03 DIAGNOSIS — L299 Pruritus, unspecified: Secondary | ICD-10-CM | POA: Insufficient documentation

## 2012-01-03 DIAGNOSIS — R21 Rash and other nonspecific skin eruption: Secondary | ICD-10-CM | POA: Insufficient documentation

## 2012-01-03 DIAGNOSIS — I10 Essential (primary) hypertension: Secondary | ICD-10-CM | POA: Insufficient documentation

## 2012-01-03 MED ORDER — FAMOTIDINE 20 MG PO TABS: 20.0000 mg | ORAL_TABLET | Freq: Two times a day (BID) | ORAL | Status: DC

## 2012-01-03 MED ORDER — DIPHENHYDRAMINE HCL 25 MG PO CAPS
ORAL_CAPSULE | ORAL | Status: AC
  Administered 2012-01-03: 50 mg via ORAL
  Filled 2012-01-03: qty 2

## 2012-01-03 MED ORDER — FAMOTIDINE 20 MG PO TABS
20.0000 mg | ORAL_TABLET | Freq: Once | ORAL | Status: AC
  Administered 2012-01-03: 20 mg via ORAL
  Filled 2012-01-03: qty 1

## 2012-01-03 MED ORDER — PREDNISONE 50 MG PO TABS
60.0000 mg | ORAL_TABLET | Freq: Once | ORAL | Status: AC
  Administered 2012-01-03: 60 mg via ORAL
  Filled 2012-01-03: qty 1

## 2012-01-03 MED ORDER — DIPHENHYDRAMINE HCL 50 MG PO CAPS
50.0000 mg | ORAL_CAPSULE | Freq: Once | ORAL | Status: AC
  Administered 2012-01-03: 50 mg via ORAL
  Filled 2012-01-03: qty 1

## 2012-01-03 MED ORDER — DIPHENHYDRAMINE HCL 25 MG PO CAPS: 50.0000 mg | ORAL_CAPSULE | Freq: Four times a day (QID) | ORAL | Status: DC | PRN

## 2012-01-03 MED ORDER — PREDNISONE 20 MG PO TABS: ORAL_TABLET | ORAL | Status: DC

## 2012-01-03 NOTE — ED Provider Notes (Signed)
History     CSN: [id number]  Arrival date & time 01/03/12  4098   First MD Initiated Contact with Patient 01/03/12 [phone]      Chief Complaint  Patient presents with  . Pruritis    (Consider location/radiation/quality/duration/timing/severity/associated sxs/prior treatment) Patient is a 41 y.o. female presenting with allergic reaction. The history is provided by the patient. No language interpreter was used.  Allergic Reaction The primary symptoms are  rash. The primary symptoms do not include wheezing, shortness of breath, cough, abdominal pain, nausea, vomiting, dizziness, palpitations, altered mental status, angioedema or urticaria. The current episode started yesterday. The problem has been gradually worsening. This is a new problem.  The rash began yesterday. The rash appears on the left arm and right arm. The rash is associated with itching. Risk factors for rash include new medications (protonix and xopenex).  Significant symptoms also include itching. Significant symptoms that are not present include eye redness or rhinorrhea.    Past Medical History  Diagnosis Date  . Hypertension   . Sickle cell trait   . Uveitis   . Sleep apnea   . Hyperthyroidism   . Headache   . Anxiety   . PTSD (post-traumatic stress disorder)   . Shortness of breath   . SVT (supraventricular tachycardia)   . Prolonged QT interval     Past Surgical History  Procedure Date  . Knee arthroscopy w/ internal fixation tibial spine fracture ~ 2011    Rght    Family History  Problem Relation Age of Onset  . Stroke Mother   . Cancer Mother   . Hypertension Mother   . Sarcoidosis Mother   . Stroke Father   . Hypertension Father   . Hyperlipidemia Father     History  Substance Use Topics  . Smoking status: Never Smoker   . Smokeless tobacco: Never Used  . Alcohol Use: Yes     12/15/11 "glass of wine maybe 3 times/month"    OB History    Grav Para Term Preterm Abortions TAB SAB Ect Mult  Living                  Review of Systems  Constitutional: Negative for fever, activity change, appetite change and fatigue.  HENT: Negative for congestion, sore throat, rhinorrhea, neck pain and neck stiffness.   Eyes: Negative for redness.  Respiratory: Negative for cough, shortness of breath and wheezing.   Cardiovascular: Negative for palpitations.  Gastrointestinal: Negative for nausea, vomiting and abdominal pain.  Genitourinary: Negative for dysuria, urgency, frequency and flank pain.  Musculoskeletal: Negative for myalgias, back pain and arthralgias.  Skin: Positive for itching and rash.  Neurological: Negative for dizziness, light-headedness and numbness.  Psychiatric/Behavioral: Negative for altered mental status.  All other systems reviewed and are negative.    Allergies  Review of patient's allergies indicates no known allergies.  Home Medications   Current Outpatient Rx  Name Route Sig Dispense Refill  . MAGIC MOUTHWASH Oral Take 10 mLs by mouth every 2 (two) hours. Swish and spit    . AMLODIPINE BESYLATE 10 MG PO TABS Oral Take 10 mg by mouth daily.    . ASPIRIN 81 MG PO CHEW Oral Chew 1 tablet (81 mg total) by mouth daily. 30 tablet 12  . DIPHENHYDRAMINE HCL 25 MG PO CAPS Oral Take 2 capsules (50 mg total) by mouth every 6 (six) hours as needed for itching. 30 capsule 0  . FAMOTIDINE 20 MG PO TABS Oral Take  1 tablet (20 mg total) by mouth 2 (two) times daily. 30 tablet 0  . HYDROCODONE-ACETAMINOPHEN 5-325 MG PO TABS Oral Take 1 tablet by mouth every 4 (four) hours as needed for pain. 12 tablet 0  . LEVALBUTEROL TARTRATE 45 MCG/ACT IN AERO Inhalation Inhale 2 puffs into the lungs every 3 (three) hours as needed for wheezing. 1 Inhaler 1  . LORAZEPAM 0.5 MG PO TABS Oral Take 0.25-0.5 mg by mouth daily as needed. For anxiety.    . METHIMAZOLE 15 MG PO TABS Oral Take 2 tablets (30 mg total) by mouth daily. 12 tablet 0  . METOPROLOL SUCCINATE ER 25 MG PO TB24 Oral Take  1 tablet (25 mg total) by mouth 2 (two) times daily. Take with or immediately following a meal. 60 tablet 2  . PANTOPRAZOLE SODIUM 40 MG PO TBEC Oral Take 1 tablet (40 mg total) by mouth 2 (two) times daily. 60 tablet 1  . PREDNISONE 20 MG PO TABS  3 Tabs PO Days 1-3, then 2 tabs PO Days 4-6, then 1 tab PO Day 7-9, then Half Tab PO Day 10-12 20 tablet 0    BP 125/87  Pulse 86  Temp(Src) 98.2 F (36.8 C) (Oral)  Resp 18  SpO2 100%  LMP 12/09/2011  Physical Exam  Nursing note and vitals reviewed. Constitutional: She is oriented to person, place, and time. She appears well-developed and well-nourished. No distress.  HENT:  Head: Normocephalic and atraumatic.  Mouth/Throat: Oropharynx is clear and moist.  Eyes: Conjunctivae and EOM are normal. Pupils are equal, round, and reactive to light.  Neck: Normal range of motion. Neck supple.  Cardiovascular: Normal rate, regular rhythm, normal heart sounds and intact distal pulses.  Exam reveals no gallop and no friction rub.   No murmur heard. Pulmonary/Chest: Effort normal and breath sounds normal. No respiratory distress. She exhibits no tenderness.  Abdominal: Soft. Bowel sounds are normal. There is no tenderness. There is no rebound and no guarding.  Musculoskeletal: Normal range of motion. She exhibits no edema and no tenderness.  Neurological: She is alert and oriented to person, place, and time. No cranial nerve deficit.  Skin: Skin is warm and dry. There is erythema (from scratching her arms).    ED Course  Procedures (including critical care time)  Labs Reviewed - No data to display No results found.   1. Pruritus   2. Allergic reaction       MDM  Pruritus secondary to an allergic reaction. This is likely secondary to recently starting Protonix. We'll instruct her to hold this. Received a dose of prednisone, Benadryl, Pepcid in the emergency department. We'll be discharged home with the same. She'll be placed on a prolonged  course of prednisone. Instructed to followup with her primary care physician early this week. I have no concern about a malignant rash or anaphylaxis as she lacks multiple organ system involvement.        Dayton Bailiff, MD 01/03/12 [phone]

## 2012-01-03 NOTE — Discharge Instructions (Signed)
Allergic Reaction, Mild to Moderate Allergies may happen from anything your body is sensitive to. This may be food, medications, pollens, chemicals, and nearly anything around you in everyday life that produces allergens. An allergen is anything that causes an allergy producing substance. Allergens cause your body to release allergic antibodies. Through a chain of events, they cause a release of histamine into the blood stream. Histamines are meant to protect you, but they also cause your discomfort. This is why antihistamines are often used for allergies. Heredity is often a factor in causing allergic reactions. This means you may have some of the same allergies as your parents. Allergies happen in all age groups. You may have some idea of what caused your reaction. There are many allergens around us. It may be difficult to know what caused your reaction. If this is a first time event, it may never happen again. Allergies cannot be cured but can be controlled with medications. SYMPTOMS  You may get some or all of the following problems from allergies.  Swelling and itching in and around the mouth.   Tearing, itchy eyes.   Nasal congestion and runny nose.   Sneezing and coughing.   An itchy red rash or hives.   Vomiting or diarrhea.   Difficulty breathing.  Seasonal allergies occur in all age groups. They are seasonal because they usually occur during the same season every year. They may be a reaction to molds, grass pollens, or tree pollens. Other causes of allergies are house dust mite allergens, pet dander and mold spores. These are just a common few of the thousands of allergens around us. All of the symptoms listed above happen when you come in contact with pollens and other allergens. Seasonal allergies are usually not life threatening. They are generally more of a nuisance that can often be handled using medications. Hay fever is a combination of all or some of the above listed allergy  problems. It may often be treated with simple over-the-counter medications such as diphenhydramine. Take medication as directed. Check with your caregiver or package insert for child dosages. TREATMENT AND HOME CARE INSTRUCTIONS If hives or rash are present:  Take medications as directed.   You may use an over-the-counter antihistamine (diphenhydramine) for hives and itching as needed. Do not drive or drink alcohol until medications used to treat the reaction have worn off. Antihistamines tend to make people sleepy.   Apply cold cloths (compresses) to the skin or take baths in cool water. This will help itching. Avoid hot baths or showers. Heat will make a rash and itching worse.   If your allergies persist and become more severe, and over the counter medications are not effective, there are many new medications your caretaker can prescribe. Immunotherapy or desensitizing injections can be used if all else fails. Follow up with your caregiver if problems continue.  SEEK MEDICAL CARE IF:   Your allergies are becoming progressively more troublesome.   You suspect a food allergy. Symptoms generally happen within 30 minutes of eating a food.   Your symptoms have not gone away within 2 days or are getting worse.   You develop new symptoms.   You want to retest yourself or your child with a food or drink you think causes an allergic reaction. Never test yourself or your child of a suspected allergy without being under the watchful eye of your caregivers. A second exposure to an allergen may be life-threatening.  SEEK IMMEDIATE MEDICAL CARE IF:  You   develop difficulty breathing or wheezing, or have a tight feeling in your chest or throat.   You develop a swollen mouth, hives, swelling, or itching all over your body.  A severe reaction with any of the above problems should be considered life-threatening. If you suddenly develop difficulty breathing call for local emergency medical help. THIS IS AN  EMERGENCY. MAKE SURE YOU:   Understand these instructions.   Will watch your condition.   Will get help right away if you are not doing well or get worse.  Document Released: 05/31/2007 Document Revised: 07/23/2011 Document Reviewed: 05/31/2007 [facility] Patient Information 2012 [place], [place].  Itching Itching is a symptom that can be caused by many things. These include skin problems (including infections) as well as some internal diseases.  If the itching is affecting just one area of the body, it is most likely due to a common skin problem, such as:  Poison oak and poison ivy.   Contact dermatitis (skin irritation from a plant, chemicals, fiberglass, detergents, new cosmetic, new jewelry, or other substance).   Fungus (such as athlete's foot, jock itch, or ringworm).   Head lice   Dandruff   Insect bite   Infection (such as Shingles or other virus infections).  If the itching is all over (widespread), the possible causes are many. These include:   Dry skin or eczema   Heat rash   Hives   Liver disorders   Kidney disorders  TREATMENT  Localized itching   Lubrication of the skin. Use an ointment or cream or other unperfumed moisturizers if the skin is dry. Apply frequently, especially after bathing.   Anti-itch medicines. These medications may help control the urge to scratch. Scratching always makes itching worse and increases the chance of getting an infection.   Cortisone creams and ointments. These help reduce the inflammation.   Antibiotics. Skin infections can cause itching. Topical or oral antibiotics may be needed for 10 to 20 days to get rid of an infection.  If you can identify what caused the itching, avoid this substance in the future.  Widespread itching  The following measures may help to relieve itching regardless of the cause:   Wash the skin once with soap to remove irritants.   Bathe in tepid water with baking soda, cornstarch, or oatmeal.     Use calamine lotion (nonprescription) or a baking soda solution (1 teaspoon in 4 ounces of water on the skin).   Apply 1% hydrocortisone cream (no prescription needed). Do not use this if there might be a skin infection.   Avoid scratching.   Avoid itchy or tight-fitting clothes.   Avoid excessive heat, sweating, scented soaps, and swimming pools.   The lubricants, anti-itch medicines, etc. noted above may be helpful for controlling symptoms.  SEEK MEDICAL CARE IF:   The itching becomes severe.   Your itch is not better after 1 week of treatment. Contact your caregiver to schedule further evaluation.  Document Released: 08/03/2005 Document Revised: 07/23/2011 Document Reviewed: 01/21/2007 [facility] Patient Information 2012 [place], [place].

## 2012-01-03 NOTE — ED Notes (Signed)
Patient states her legs started itching yesterday and today itches over entire body & face, no rash, no difficulty breathing or swallowing

## 2012-01-06 ENCOUNTER — Encounter: Payer: Self-pay | Admitting: Adult Health

## 2012-01-06 ENCOUNTER — Ambulatory Visit (INDEPENDENT_AMBULATORY_CARE_PROVIDER_SITE_OTHER): Payer: BC Managed Care – PPO | Admitting: Adult Health

## 2012-01-06 VITALS — BP 110/74 | HR 77 | Temp 97.6°F | Ht 64.0 in | Wt 170.8 lb

## 2012-01-06 DIAGNOSIS — R0600 Dyspnea, unspecified: Secondary | ICD-10-CM

## 2012-01-06 DIAGNOSIS — R0609 Other forms of dyspnea: Secondary | ICD-10-CM

## 2012-01-06 DIAGNOSIS — R0989 Other specified symptoms and signs involving the circulatory and respiratory systems: Secondary | ICD-10-CM

## 2012-01-06 MED ORDER — BUDESONIDE-FORMOTEROL FUMARATE 80-4.5 MCG/ACT IN AERO: 2.0000 | INHALATION_SPRAY | Freq: Two times a day (BID) | RESPIRATORY_TRACT | Status: DC

## 2012-01-06 NOTE — Patient Instructions (Signed)
Begin Symbicort 80/4.88mcg 2 puffs Twice daily  -brush/rinse and gargle after use.  May use Claritin 10mg  daily As needed  Drainage.  Follow up Dr. Delford Field  In [facility] in 2-3 weeks and As needed

## 2012-01-06 NOTE — Progress Notes (Signed)
  Subjective:    Patient ID: Jamie Barajas, female    DOB: [birth year], 41 y.o.   MRN: [id number]  HPI 41 yo AAF never smoker seen for initial pulmonary consult 12/28/11 for dyspnea and atypical chest pain.   01/06/2012 Post Hospital follow up  Admitted for atypical chest pain and dyspnea 5/13-5/16  Cardiac workup with enzymes, ekg and stress test were essentially unremarkable.  Stress myoview on 5/14 w/ EF of 70% , no sign of ischemia.  Did have admission recently with ? PE on CT however after review  PFT showed very mild airflow obstruction. Have called for report copy.  It was felt her dyspnea and chest pains were multifactoral with possible hyperthyroidism symptoms  And high dose beta blocker side effects. Metoprolol was decreased at discharge.   She returns today with some improvement but still has dyspnea and chest tightness at time.  Uses her rescue inhaler with relief.  Has follow up with cardiology in next couple of weeks.  Has some dry cough on/off.  Unable to take protonix - went to ER for pruritis - rx steroid but did not take. Switched to pepcid.  Denies fever , abd pain , overt reflux or edema.   Review of Systems Constitutional:   No  weight loss, night sweats,  Fevers, chills, + fatigue, or  lassitude.  HEENT:   No headaches,  Difficulty swallowing,  Tooth/dental problems, or  Sore throat,                No sneezing, itching, ear ache, nasal congestion, post nasal drip,   CV:  No chest pain,  Orthopnea, PND, swelling in lower extremities, anasarca, dizziness, palpitations, syncope.   GI  No heartburn, indigestion, abdominal pain, nausea, vomiting, diarrhea, change in bowel habits, loss of appetite, bloody stools.   Resp:    No excess mucus, no productive cough,     No coughing up of blood.  No change in color of mucus.  No wheezing.  No chest wall deformity  Skin: no rash or lesions.  GU: no dysuria, change in color of urine, no urgency or frequency.  No flank  pain, no hematuria   MS:  No joint pain or swelling.  No decreased range of motion.  No back pain.  Psych:  No change in mood or affect. No depression or anxiety.  No memory loss.         Objective:   Physical Exam GEN: A/Ox3; pleasant , NAD, well nourished   HEENT:  [place]/AT,  EACs-clear, TMs-wnl, NOSE-clear, THROAT-clear, no lesions, no postnasal drip or exudate noted.   NECK:  Supple w/ fair ROM; no JVD; normal carotid impulses w/o bruits; no thyromegaly or nodules palpated; no lymphadenopathy.  RESP  Clear  P & A; w/o, wheezes/ rales/ or rhonchi.no accessory muscle use, no dullness to percussion  CARD:  RRR, no m/r/g  , no peripheral edema, pulses intact, no cyanosis or clubbing.  GI:   Soft & nt; nml bowel sounds; no organomegaly or masses detected.  Musco: Warm bil, no deformities or joint swelling noted.   Neuro: alert, no focal deficits noted.    Skin: Warm, no lesions or rashes         Assessment & Plan:

## 2012-01-06 NOTE — Assessment & Plan Note (Addendum)
?   Etiology  Will obtain full PFT copy.  Discharge summary mentioned a mild airflow obstruction  Will give empiric trial of Symbicort to see if this helps her symptoms.   Plan:  Begin Symbicort 80/4.54mcg 2 puffs Twice daily  -brush/rinse and gargle after use.  May use Claritin 10mg  daily As needed  Drainage.  Follow up Dr. Delford Field  In [facility] in 2-3 weeks and As needed

## 2012-01-07 ENCOUNTER — Inpatient Hospital Stay: Payer: BC Managed Care – PPO | Admitting: Adult Health

## 2012-01-13 ENCOUNTER — Encounter: Payer: Self-pay | Admitting: Critical Care Medicine

## 2012-01-13 ENCOUNTER — Ambulatory Visit (INDEPENDENT_AMBULATORY_CARE_PROVIDER_SITE_OTHER): Payer: BC Managed Care – PPO | Admitting: Critical Care Medicine

## 2012-01-13 VITALS — BP 134/86 | HR 66 | Temp 98.8°F | Ht 64.0 in | Wt 169.8 lb

## 2012-01-13 DIAGNOSIS — R0600 Dyspnea, unspecified: Secondary | ICD-10-CM

## 2012-01-13 DIAGNOSIS — R519 Headache, unspecified: Secondary | ICD-10-CM | POA: Insufficient documentation

## 2012-01-13 DIAGNOSIS — H209 Unspecified iridocyclitis: Secondary | ICD-10-CM | POA: Insufficient documentation

## 2012-01-13 DIAGNOSIS — R0602 Shortness of breath: Secondary | ICD-10-CM | POA: Insufficient documentation

## 2012-01-13 DIAGNOSIS — R0609 Other forms of dyspnea: Secondary | ICD-10-CM

## 2012-01-13 DIAGNOSIS — D573 Sickle-cell trait: Secondary | ICD-10-CM | POA: Insufficient documentation

## 2012-01-13 DIAGNOSIS — R0989 Other specified symptoms and signs involving the circulatory and respiratory systems: Secondary | ICD-10-CM

## 2012-01-13 DIAGNOSIS — F4310 Post-traumatic stress disorder, unspecified: Secondary | ICD-10-CM | POA: Insufficient documentation

## 2012-01-13 DIAGNOSIS — R51 Headache: Secondary | ICD-10-CM | POA: Insufficient documentation

## 2012-01-13 NOTE — Assessment & Plan Note (Signed)
Dyspnea likely related to prior hypothyroidism without evidence of true asthma or primary lung disease. Recent hospitalization with negative evaluation for pulmonary embolism.  Plan Discontinue Symbicort Continued use of Xopenex inhaler as needed Monitor peak flow rate Return 1 month with diarrhea and peak flow rates

## 2012-01-13 NOTE — Patient Instructions (Signed)
Stop symbicort USe peak flow meter twice daily, bring back diary Use Xopenex as needed No other medication changes Return 2-3 weeks with diary

## 2012-01-13 NOTE — Progress Notes (Signed)
Subjective:    Patient ID: Jamie Barajas, female    DOB: Sep 05, 1970, 41 y.o.   MRN: [id number]  HPI  41 yo AAF never smoker seen for initial pulmonary consult 12/28/11 for dyspnea and atypical chest pain.   5/22 Post Hospital follow up  Admitted for atypical chest pain and dyspnea 5/13-5/16  Cardiac workup with enzymes, ekg and stress test were essentially unremarkable.  Stress myoview on 5/14 w/ EF of 70% , no sign of ischemia.  Did have admission recently with ? PE on CT however after review  PFT showed very mild airflow obstruction. Have called for report copy.  It was felt her dyspnea and chest pains were multifactoral with possible hyperthyroidism symptoms  And high dose beta blocker side effects. Metoprolol was decreased at discharge.   She returns today with some improvement but still has dyspnea and chest tightness at time.  Uses her rescue inhaler with relief.  Has follow up with cardiology in next couple of weeks.  Has some dry cough on/off.  Unable to take protonix - went to ER for pruritis - rx steroid but did not take. Switched to pepcid.  Denies fever , abd pain , overt reflux or edema.   01/13/2012 Pt still notes dyspnea.  This appears to come and go. It is sporadic.  Went to [place] and [address] and stopped. Felt light headed.  xopenex relieves.  1 month ago was ok. This started in April Now is better.    Dx hyperthyroid.  Sees [facility].  Endocrine , is in [facility] Last OV  One week ago.  No changes made.  Past Medical History  Diagnosis Date  . Hypertension   . Sickle cell trait   . Uveitis   . Hyperthyroidism   . Headache   . Anxiety   . PTSD (post-traumatic stress disorder)   . Shortness of breath   . SVT (supraventricular tachycardia)   . Prolonged QT interval   . Chest pain     Nuclear, Dec 29, 2011, normal, EF 70%     Family History  Problem Relation Age of Onset  . Stroke Mother   . Cancer Mother   . Hypertension Mother   . Sarcoidosis Mother    . Stroke Father   . Hypertension Father   . Hyperlipidemia Father      History   Social History  . Marital Status: Married    Spouse Name: N/A    Number of Children: 2  . Years of Education: N/A   Occupational History  . TEACHER    Social History Main Topics  . Smoking status: Never Smoker   . Smokeless tobacco: Never Used  . Alcohol Use: Yes     12/15/11 "glass of wine maybe 3 times/month"  . Drug Use: No  . Sexually Active: Yes   Other Topics Concern  . Not on file   Social History Narrative   Lives with husband in [place], [place].      No Known Allergies   Outpatient Prescriptions Prior to Visit  Medication Sig Dispense Refill  . amLODipine (NORVASC) 10 MG tablet Take 10 mg by mouth daily.      [name] aspirin 81 MG chewable tablet Chew 1 tablet (81 mg total) by mouth daily.  30 tablet  12  . diphenhydrAMINE (BENADRYL) 25 mg capsule Take 2 capsules (50 mg total) by mouth every 6 (six) hours as needed for itching.  30 capsule  0  . famotidine (PEPCID) 20 MG tablet Take  1 tablet (20 mg total) by mouth 2 (two) times daily.  30 tablet  0  . levalbuterol (XOPENEX HFA) 45 MCG/ACT inhaler Inhale 2 puffs into the lungs every 3 (three) hours as needed for wheezing.  1 Inhaler  1  . LORazepam (ATIVAN) 0.5 MG tablet Take 0.25-0.5 mg by mouth daily as needed. For anxiety.      . methimazole 15 MG TABS Take 2 tablets (30 mg total) by mouth daily.  12 tablet  0  . metoprolol succinate (TOPROL-XL) 25 MG 24 hr tablet Take 1 tablet (25 mg total) by mouth 2 (two) times daily. Take with or immediately following a meal.  60 tablet  2  . budesonide-formoterol (SYMBICORT) 80-4.5 MCG/ACT inhaler Inhale 2 puffs into the lungs 2 (two) times daily.  1 Inhaler  12  . pantoprazole (PROTONIX) 40 MG tablet Take 1 tablet (40 mg total) by mouth 2 (two) times daily.  60 tablet  1      Review of Systems  Constitutional:   No  weight loss, night sweats,  Fevers, chills, + fatigue, or   lassitude.  HEENT:   No headaches,  Difficulty swallowing,  Tooth/dental problems, or  Sore throat,                No sneezing, itching, ear ache, nasal congestion, post nasal drip,   CV:  No chest pain,  Orthopnea, PND, swelling in lower extremities, anasarca, dizziness, palpitations, syncope.   GI  No heartburn, indigestion, abdominal pain, nausea, vomiting, diarrhea, change in bowel habits, loss of appetite, bloody stools.   Resp:    No excess mucus, no productive cough,     No coughing up of blood.  No change in color of mucus.  No wheezing.  No chest wall deformity  Skin: no rash or lesions.  GU: no dysuria, change in color of urine, no urgency or frequency.  No flank pain, no hematuria   MS:  No joint pain or swelling.  No decreased range of motion.  No back pain.  Psych:  No change in mood or affect. No depression or anxiety.  No memory loss.         Objective:   Physical Exam BP 134/86  Pulse 66  Temp(Src) 98.8 F (37.1 C) (Oral)  Ht 5\' 4"  (1.626 m)  Wt 169 lb 12.8 oz (77.021 kg)  BMI 29.15 kg/m2  GEN: A/Ox3; pleasant , NAD, well nourished   HEENT:  [place]/AT,  EACs-clear, TMs-wnl, NOSE-clear, THROAT-clear, no lesions, no postnasal drip or exudate noted.   NECK:  Supple w/ fair ROM; no JVD; normal carotid impulses w/o bruits; no thyromegaly or nodules palpated; no lymphadenopathy.  RESP  Clear  P & A; w/o, wheezes/ rales/ or rhonchi.no accessory muscle use, no dullness to percussion  CARD:  RRR, no m/r/g  , no peripheral edema, pulses intact, no cyanosis or clubbing.  GI:   Soft & nt; nml bowel sounds; no organomegaly or masses detected.  Musco: Warm bil, no deformities or joint swelling noted.   Neuro: alert, no focal deficits noted.    Skin: Warm, no lesions or rashes         Assessment & Plan:   Dyspnea Dyspnea likely related to prior hypothyroidism without evidence of true asthma or primary lung disease. Recent hospitalization with negative  evaluation for pulmonary embolism.  Plan Discontinue Symbicort Continued use of Xopenex inhaler as needed Monitor peak flow rate Return 1 month with diarrhea and peak flow rates  Updated Medication List Outpatient Encounter Prescriptions as of 01/13/2012  Medication Sig Dispense Refill  . amLODipine (NORVASC) 10 MG tablet Take 10 mg by mouth daily.      [name] aspirin 81 MG chewable tablet Chew 1 tablet (81 mg total) by mouth daily.  30 tablet  12  . diphenhydrAMINE (BENADRYL) 25 mg capsule Take 2 capsules (50 mg total) by mouth every 6 (six) hours as needed for itching.  30 capsule  0  . famotidine (PEPCID) 20 MG tablet Take 1 tablet (20 mg total) by mouth 2 (two) times daily.  30 tablet  0  . levalbuterol (XOPENEX HFA) 45 MCG/ACT inhaler Inhale 2 puffs into the lungs every 3 (three) hours as needed for wheezing.  1 Inhaler  1  . LORazepam (ATIVAN) 0.5 MG tablet Take 0.25-0.5 mg by mouth daily as needed. For anxiety.      . methimazole 15 MG TABS Take 2 tablets (30 mg total) by mouth daily.  12 tablet  0  . metoprolol succinate (TOPROL-XL) 25 MG 24 hr tablet Take 1 tablet (25 mg total) by mouth 2 (two) times daily. Take with or immediately following a meal.  60 tablet  2  . DISCONTD: budesonide-formoterol (SYMBICORT) 80-4.5 MCG/ACT inhaler Inhale 2 puffs into the lungs 2 (two) times daily.  1 Inhaler  12  . DISCONTD: HYDROcodone-acetaminophen (NORCO) 5-325 MG per tablet       . DISCONTD: pantoprazole (PROTONIX) 40 MG tablet Take 1 tablet (40 mg total) by mouth 2 (two) times daily.  60 tablet  1

## 2012-01-14 ENCOUNTER — Ambulatory Visit (INDEPENDENT_AMBULATORY_CARE_PROVIDER_SITE_OTHER): Payer: BC Managed Care – PPO | Admitting: Cardiology

## 2012-01-14 ENCOUNTER — Encounter: Payer: Self-pay | Admitting: Cardiology

## 2012-01-14 VITALS — BP 120/72 | HR 87 | Ht 64.0 in | Wt 169.0 lb

## 2012-01-14 DIAGNOSIS — E0590 Thyrotoxicosis, unspecified without thyrotoxic crisis or storm: Secondary | ICD-10-CM | POA: Insufficient documentation

## 2012-01-14 DIAGNOSIS — R079 Chest pain, unspecified: Secondary | ICD-10-CM | POA: Insufficient documentation

## 2012-01-14 DIAGNOSIS — I498 Other specified cardiac arrhythmias: Secondary | ICD-10-CM

## 2012-01-14 DIAGNOSIS — R0602 Shortness of breath: Secondary | ICD-10-CM | POA: Insufficient documentation

## 2012-01-14 DIAGNOSIS — R0609 Other forms of dyspnea: Secondary | ICD-10-CM

## 2012-01-14 DIAGNOSIS — I10 Essential (primary) hypertension: Secondary | ICD-10-CM

## 2012-01-14 DIAGNOSIS — R0600 Dyspnea, unspecified: Secondary | ICD-10-CM

## 2012-01-14 DIAGNOSIS — I471 Supraventricular tachycardia: Secondary | ICD-10-CM | POA: Insufficient documentation

## 2012-01-14 DIAGNOSIS — R0989 Other specified symptoms and signs involving the circulatory and respiratory systems: Secondary | ICD-10-CM

## 2012-01-14 DIAGNOSIS — R9431 Abnormal electrocardiogram [ECG] [EKG]: Secondary | ICD-10-CM

## 2012-01-14 NOTE — Assessment & Plan Note (Signed)
There is question of prolonged QT interval. The patient has never had syncope or presyncope. On some of her EKGs and nonspecific ST-T wave changes are more marked and the QT interval is read as prolonged. However on the clear tracings with clear T waves there is no marked QT interval prolongation.The patient needs to be careful with the medications that she uses. Otherwise no further workup.

## 2012-01-14 NOTE — Assessment & Plan Note (Signed)
The patient's EKG is abnormal. She has normal LV function by echo. Her nuclear stress study is normal. No further workup.

## 2012-01-14 NOTE — Assessment & Plan Note (Signed)
The patient continues to have some episodes of dyspnea. It is felt by pulmonary that she does not have true asthma or primary lung disease. She did not have a definite pulmonary embolus. LV function is normal and there is no ischemia by nuclear scanning.

## 2012-01-14 NOTE — Assessment & Plan Note (Signed)
There is no evidence that the patient's chest discomfort is related to any significant cardiac disease. Nuclear scan reveals no ischemia. There was question of proceeding with cardiac catheterization. This was not done because of the patient's hyperthyroidism and concern about iodine load.

## 2012-01-14 NOTE — Assessment & Plan Note (Signed)
The patient has had atrial tachycardia in the past. No change in therapy.

## 2012-01-14 NOTE — Patient Instructions (Signed)
Your physician wants you to follow-up in:  6 months. You will receive a reminder letter in the mail two months in advance. If you don't receive a letter, please call our office to schedule the follow-up appointment.   

## 2012-01-14 NOTE — Progress Notes (Signed)
HPI Patient is seen for cardiology followup.She is seen post hospitalization. The records are extensive. She had several hospitalizations. I have reviewed the cardiology notes and the pulmonary notes in the endocrinology notes. The patient has hyperthyroidism. This is being treated. Along with this she's had multiple different symptoms. In the hospital initially there was questionable pulmonary embolus. I will later hospitalization with very careful review it was felt that this was not a real pulmonary embolus. Her anticoagulation was stopped. She's been seen by the pulmonary team. She's also been seen as an outpatient and followup by pulmonary. It is felt that she does not have any major pulmonary disease. While in the hospital she had chest pain. She has abnormal EKG. There was question of an abnormal QT interval. Two-dimensional echo shows normal left ventricular function. Stress nuclear scan revealed no scar or ischemia and a normal ejection fraction. Review of EKGs reveal that her QT interval is read as abnormal when she has nonspecific ST-T wave changes. When her T wave is clear her QT interval is not markedly prolonged.  The patient is seen in followup. She continues to have episodes that are random with exertional shortness of breath.  No Known Allergies  Current Outpatient Prescriptions  Medication Sig Dispense Refill  . amLODipine (NORVASC) 10 MG tablet Take 10 mg by mouth daily.      [name] aspirin 81 MG chewable tablet Chew 1 tablet (81 mg total) by mouth daily.  30 tablet  12  . famotidine (PEPCID) 20 MG tablet Take 1 tablet (20 mg total) by mouth 2 (two) times daily.  30 tablet  0  . levalbuterol (XOPENEX HFA) 45 MCG/ACT inhaler Inhale 2 puffs into the lungs every 3 (three) hours as needed for wheezing.  1 Inhaler  1  . LORazepam (ATIVAN) 0.5 MG tablet Take 0.5 mg by mouth daily as needed. For anxiety.      . methimazole (TAPAZOLE) 10 MG tablet Take 30 mg by mouth daily.      . metoprolol  succinate (TOPROL-XL) 25 MG 24 hr tablet Take 1 tablet (25 mg total) by mouth 2 (two) times daily. Take with or immediately following a meal.  60 tablet  2  . diphenhydrAMINE (BENADRYL) 25 mg capsule Take 2 capsules (50 mg total) by mouth every 6 (six) hours as needed for itching.  30 capsule  0    History   Social History  . Marital Status: Married    Spouse Name: N/A    Number of Children: 2  . Years of Education: N/A   Occupational History  . TEACHER    Social History Main Topics  . Smoking status: Never Smoker   . Smokeless tobacco: Never Used  . Alcohol Use: Yes     12/15/11 "glass of wine maybe 3 times/month"  . Drug Use: No  . Sexually Active: Yes   Other Topics Concern  . Not on file   Social History Narrative   Lives with husband in [place], [place].     Family History  Problem Relation Age of Onset  . Stroke Mother   . Cancer Mother   . Hypertension Mother   . Sarcoidosis Mother   . Stroke Father   . Hypertension Father   . Hyperlipidemia Father     Past Medical History  Diagnosis Date  . Hypertension   . Sickle cell trait   . Uveitis   . Hyperthyroidism     2013  . Headache   . Anxiety   .  PTSD (post-traumatic stress disorder)   . Shortness of breath     Echo, May, 2013, EF 65%, no valvular abnormalities.  . SVT (supraventricular tachycardia)   . Prolonged QT interval   . Chest pain     Nuclear, Dec 29, 2011, normal, EF 70%    Past Surgical History  Procedure Date  . Knee arthroscopy w/ internal fixation tibial spine fracture ~ 2011    Rght    ROS Patient denies fever, chills, headache, rash, change in vision, change in hearing, nausea vomiting, urinary symptoms. All other systems are reviewed and are negative.  PHYSICAL EXAM  Patient is oriented to person time and place. Affect is normal. She's here with her husband. There is no jugulovenous distention. Lungs are clear. Respiratory effort is not labored. Cardiac exam reveals S1 and S2. There  no clicks or significant murmurs. Abdomen is soft. There is no peripheral edema. There no musculoskeletal deformities. There are no skin rashes.  Filed Vitals:   01/14/12 1522  BP: 120/72  Pulse: 87  Height: 5\' 4"  (1.626 m)  Weight: 169 lb (76.658 kg)  SpO2: 99%    ASSESSMENT & PLAN

## 2012-01-14 NOTE — Assessment & Plan Note (Signed)
Her hyperthyroidism has been very carefully assessed and is being treated.

## 2012-01-14 NOTE — Assessment & Plan Note (Signed)
Blood pressures control today. No change in therapy. 

## 2012-01-15 ENCOUNTER — Encounter: Payer: BC Managed Care – PPO | Admitting: Nurse Practitioner

## 2012-01-18 ENCOUNTER — Ambulatory Visit (HOSPITAL_COMMUNITY): Payer: Worker's Compensation

## 2012-01-19 ENCOUNTER — Emergency Department (HOSPITAL_BASED_OUTPATIENT_CLINIC_OR_DEPARTMENT_OTHER): Payer: No Typology Code available for payment source

## 2012-01-19 ENCOUNTER — Emergency Department (HOSPITAL_BASED_OUTPATIENT_CLINIC_OR_DEPARTMENT_OTHER)
Admission: EM | Admit: 2012-01-19 | Discharge: 2012-01-19 | Disposition: A | Discharge status: Home / Self Care | Payer: No Typology Code available for payment source | Attending: Emergency Medicine | Admitting: Emergency Medicine

## 2012-01-19 ENCOUNTER — Encounter (HOSPITAL_BASED_OUTPATIENT_CLINIC_OR_DEPARTMENT_OTHER): Payer: Self-pay

## 2012-01-19 ENCOUNTER — Other Ambulatory Visit (HOSPITAL_COMMUNITY): Payer: Worker's Compensation

## 2012-01-19 DIAGNOSIS — Y9210 Unspecified residential institution as the place of occurrence of the external cause: Secondary | ICD-10-CM | POA: Insufficient documentation

## 2012-01-19 DIAGNOSIS — S139XXA Sprain of joints and ligaments of unspecified parts of neck, initial encounter: Secondary | ICD-10-CM | POA: Insufficient documentation

## 2012-01-19 DIAGNOSIS — M549 Dorsalgia, unspecified: Secondary | ICD-10-CM

## 2012-01-19 DIAGNOSIS — Y998 Other external cause status: Secondary | ICD-10-CM | POA: Insufficient documentation

## 2012-01-19 DIAGNOSIS — S161XXA Strain of muscle, fascia and tendon at neck level, initial encounter: Secondary | ICD-10-CM

## 2012-01-19 DIAGNOSIS — D573 Sickle-cell trait: Secondary | ICD-10-CM | POA: Insufficient documentation

## 2012-01-19 DIAGNOSIS — F4310 Post-traumatic stress disorder, unspecified: Secondary | ICD-10-CM | POA: Insufficient documentation

## 2012-01-19 DIAGNOSIS — E0590 Thyrotoxicosis, unspecified without thyrotoxic crisis or storm: Secondary | ICD-10-CM | POA: Insufficient documentation

## 2012-01-19 DIAGNOSIS — F411 Generalized anxiety disorder: Secondary | ICD-10-CM | POA: Insufficient documentation

## 2012-01-19 MED ORDER — HYDROCODONE-ACETAMINOPHEN 5-500 MG PO TABS: 1.0000 | ORAL_TABLET | Freq: Four times a day (QID) | ORAL | Status: AC | PRN
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
## 2012-01-21 ENCOUNTER — Ambulatory Visit: Payer: BC Managed Care – PPO | Admitting: Critical Care Medicine

## 2012-01-25 NOTE — ED Provider Notes (Signed)
Medical screening examination/treatment/procedure(s) were performed by non-physician practitioner and as supervising physician I was immediately available for consultation/collaboration.    Wilhelm Ganaway L Lamarcus Spira, MD 01/25/12 1135 

## 2012-02-03 ENCOUNTER — Ambulatory Visit: Payer: BC Managed Care – PPO | Admitting: Critical Care Medicine

## 2012-02-05 ENCOUNTER — Emergency Department (HOSPITAL_BASED_OUTPATIENT_CLINIC_OR_DEPARTMENT_OTHER): Payer: BC Managed Care – PPO

## 2012-02-05 ENCOUNTER — Emergency Department (HOSPITAL_BASED_OUTPATIENT_CLINIC_OR_DEPARTMENT_OTHER)
Admission: EM | Admit: 2012-02-05 | Discharge: 2012-02-05 | Disposition: A | Discharge status: Home / Self Care | Payer: BC Managed Care – PPO | Attending: Emergency Medicine | Admitting: Emergency Medicine

## 2012-02-05 ENCOUNTER — Encounter (HOSPITAL_BASED_OUTPATIENT_CLINIC_OR_DEPARTMENT_OTHER): Payer: Self-pay | Admitting: *Deleted

## 2012-02-05 DIAGNOSIS — I10 Essential (primary) hypertension: Secondary | ICD-10-CM | POA: Insufficient documentation

## 2012-02-05 DIAGNOSIS — G459 Transient cerebral ischemic attack, unspecified: Secondary | ICD-10-CM | POA: Insufficient documentation

## 2012-02-05 DIAGNOSIS — F411 Generalized anxiety disorder: Secondary | ICD-10-CM | POA: Insufficient documentation

## 2012-02-05 DIAGNOSIS — Z79899 Other long term (current) drug therapy: Secondary | ICD-10-CM | POA: Insufficient documentation

## 2012-02-05 DIAGNOSIS — I4891 Unspecified atrial fibrillation: Secondary | ICD-10-CM | POA: Insufficient documentation

## 2012-02-05 DIAGNOSIS — E0590 Thyrotoxicosis, unspecified without thyrotoxic crisis or storm: Secondary | ICD-10-CM | POA: Insufficient documentation

## 2012-02-05 LAB — CBC
HCT: 35.6 % — ABNORMAL LOW (ref 36.0–46.0)
Hemoglobin: 12.3 g/dL (ref 12.0–15.0)
WBC: 6.4 10*3/uL (ref 4.0–10.5)

## 2012-02-05 LAB — TROPONIN I: Troponin I: <0.3 ng/mL (ref <0.30)

## 2012-02-05 LAB — COMPREHENSIVE METABOLIC PANEL
ALT: 10 U/L (ref 0–35)
Alkaline Phosphatase: 82 U/L (ref 39–117)
CO2: 26 mEq/L (ref 19–32)
Chloride: 102 mEq/L (ref 96–112)
GFR calc Af Amer: >90 mL/min (ref >90)
Glucose, Bld: 103 mg/dL — ABNORMAL HIGH (ref 70–99)
Potassium: 3.7 mEq/L (ref 3.5–5.1)
Sodium: 139 mEq/L (ref 135–145)
Total Protein: 8.2 g/dL (ref 6.0–8.3)

## 2012-02-05 LAB — DIFFERENTIAL
Basophils Absolute: 0 10*3/uL (ref 0.0–0.1)
Basophils Relative: 0 % (ref 0–1)
Eosinophils Relative: 0 % (ref 0–5)
Lymphocytes Relative: 29 % (ref 12–46)
Monocytes Relative: 9 % (ref 3–12)
Neutro Abs: 3.9 10*3/uL (ref 1.7–7.7)

## 2012-02-05 LAB — RAPID URINE DRUG SCREEN, HOSP PERFORMED
Amphetamines: NOT DETECTED
Benzodiazepines: NOT DETECTED

## 2012-02-05 LAB — GLUCOSE, CAPILLARY: Glucose-Capillary: 93 mg/dL (ref 70–99)

## 2012-02-05 LAB — APTT: aPTT: 35 seconds (ref 24–37)

## 2012-02-05 NOTE — ED Provider Notes (Signed)
History     CSN: [id number]  Arrival date & time 02/05/12  1604   First MD Initiated Contact with Patient 02/05/12 1631      Chief Complaint  Patient presents with  . Numbness    (Consider location/radiation/quality/duration/timing/severity/associated sxs/prior treatment) HPI This 41 year old female is currently asymptomatic however about an hour prior to arrival had an episode lasting likely less than 10 minutes of transient left-sided facial arm and leg numbness. She is no pain with spell. She has been having intermittent spells over the last few days of left facial numbness without pain, with or without associated left arm and leg numbness without pain. She is no weakness or incoordination. She is no headache. She is no lightheadedness or vertigo. She is no change in speech vision swallowing or understanding. She is no dental pain or neck pain or back pain. She is no chest pain palpitations shortness breath or abdominal pain. She has had mild cough. Her doctor put her on amoxicillin for cough thinking that might help her facial numbness according to the patient. There is no other treatment prior to arrival. Her last episode lasted about 10 minutes or less as did her prior episodes she has had a few episodes per day for the last few days. Past Medical History  Diagnosis Date  . Hypertension   . Sickle cell trait   . Uveitis   . Hyperthyroidism     2013, Patient seen extensively by endocrinology with treatment given. See hospital records  . Headache   . Anxiety   . PTSD (post-traumatic stress disorder)   . Shortness of breath     Echo, May, 2013, EF 65%, no valvular abnormalities.  . SVT (supraventricular tachycardia)   . Prolonged QT interval     QT interval is normal when the T wave is clear  . Chest pain     Nuclear, Dec 29, 2011, normal, EF 70%  . Atrial tachycardia     History of atrial tachycardia prior to 2013.  [name] Abnormal EKG     Decreased anterior R wave progression,  nonspecific ST-T wave changes    Past Surgical History  Procedure Date  . Knee arthroscopy w/ internal fixation tibial spine fracture ~ 2011    Rght    Family History  Problem Relation Age of Onset  . Stroke Mother   . Cancer Mother   . Hypertension Mother   . Sarcoidosis Mother   . Stroke Father   . Hypertension Father   . Hyperlipidemia Father     History  Substance Use Topics  . Smoking status: Never Smoker   . Smokeless tobacco: Never Used  . Alcohol Use: No     12/15/11 "glass of wine maybe 3 times/month"    OB History    Grav Para Term Preterm Abortions TAB SAB Ect Mult Living                  Review of Systems  Constitutional: Negative for fever.       10 Systems reviewed and are negative for acute change except as noted in the HPI.  HENT: Negative for congestion.   Eyes: Negative for discharge and redness.  Respiratory: Negative for cough and shortness of breath.   Cardiovascular: Negative for chest pain.  Gastrointestinal: Negative for vomiting and abdominal pain.  Musculoskeletal: Negative for back pain.  Skin: Negative for rash.  Neurological: Positive for numbness. Negative for dizziness, syncope, facial asymmetry, speech difficulty, weakness, light-headedness and headaches.  Psychiatric/Behavioral:       No behavior change.    Allergies  Azithromycin and Protonix  Home Medications   Current Outpatient Rx  Name Route Sig Dispense Refill  . AMLODIPINE BESYLATE 10 MG PO TABS Oral Take 10 mg by mouth daily.    . AMOXICILLIN 875 MG PO TABS Oral Take 875 mg by mouth 2 (two) times daily.    . ASPIRIN 81 MG PO CHEW Oral Chew 1 tablet (81 mg total) by mouth daily. 30 tablet 12  . LORAZEPAM 0.5 MG PO TABS Oral Take 0.5 mg by mouth daily as needed. For anxiety.    . METHIMAZOLE 10 MG PO TABS Oral Take 30 mg by mouth daily.    [name] METOPROLOL SUCCINATE ER 25 MG PO TB24 Oral Take 1 tablet (25 mg total) by mouth 2 (two) times daily. Take with or immediately  following a meal. 60 tablet 2  . DIPHENHYDRAMINE HCL 25 MG PO CAPS Oral Take 2 capsules (50 mg total) by mouth every 6 (six) hours as needed for itching. 30 capsule 0  . FAMOTIDINE 20 MG PO TABS Oral Take 1 tablet (20 mg total) by mouth 2 (two) times daily. 30 tablet 0  . LEVALBUTEROL TARTRATE 45 MCG/ACT IN AERO Inhalation Inhale 2 puffs into the lungs every 3 (three) hours as needed for wheezing. 1 Inhaler 1    BP 124/79  Pulse 76  Temp 98.2 F (36.8 C) (Oral)  Resp 18  Ht 5\' 4"  (1.626 m)  Wt 165 lb (74.844 kg)  BMI 28.32 kg/m2  SpO2 99%  LMP 02/03/2012  Physical Exam  Nursing note and vitals reviewed. Constitutional:       Awake, alert, nontoxic appearance with baseline speech for patient.  HENT:  Head: Atraumatic.  Mouth/Throat: No oropharyngeal exudate.  Eyes: EOM are normal. Pupils are equal, round, and reactive to light. Right eye exhibits no discharge. Left eye exhibits no discharge.  Neck: Neck supple.  Cardiovascular: Normal rate and regular rhythm.   No murmur heard. Pulmonary/Chest: Effort normal and breath sounds normal. No stridor. No respiratory distress. She has no wheezes. She has no rales. She exhibits no tenderness.  Abdominal: Soft. Bowel sounds are normal. She exhibits no mass. There is no tenderness. There is no rebound.  Musculoskeletal: She exhibits no tenderness.       Baseline ROM, moves extremities with no obvious new focal weakness.  Lymphadenopathy:    She has no cervical adenopathy.  Neurological: She is alert.       Awake, alert, cooperative and aware of situation; motor strength bilaterally; sensation normal to light touch bilaterally; peripheral visual fields full to confrontation; no facial asymmetry; tongue midline; major cranial nerves appear intact; no pronator drift, normal finger to nose bilaterally, baseline gait without new ataxia.  Skin: No rash noted.  Psychiatric: She has a normal mood and affect.    ED Course  Procedures (including  critical care time) The patient is very calm pleasant cooperative and appears to have capacity to make medical decisions, but states that due to a family funeral tomorrow morning of her mother-in-law she cannot stay even for overnight observation and will likely be leaving the emergency department AGAINST MEDICAL ADVICE after ED evaluation of CT scan, ECG, and blood tests knowing that she has a significant risk of major stroke with chronic disability and/or death if her symptoms are from TIAs of which her history is suggestive.[id number]  ECG: Normal sinus rhythm, surgical rate 80, normal axis, normal  intervals, nonspecific T wave changes, nonspecific T wave flattening precordial leads new since 12/30/2011 comparison  Patient remains asymptomatic in the emergency department, she and her family state patient has had a full evaluation with neurology in the past including MRI of the brain, carotid ultrasound, and echocardiogram for similar symptoms within the last couple years without explanation. She does take aspirin daily and will continue. Patient understands customary care is transfer request, however the patient requests discharge and outpatient followup instead with Neuro aware I cannot r/o TIAs. 2039 Labs Reviewed  CBC - Abnormal; Notable for the following:    RBC 5.59 (*)     HCT 35.6 (*)     MCV 63.7 (*)     MCH 22.0 (*)     RDW 15.8 (*)     All other components within normal limits  COMPREHENSIVE METABOLIC PANEL - Abnormal; Notable for the following:    Glucose, Bld 103 (*)     GFR calc non Af Amer 90 (*)     All other components within normal limits  CK TOTAL AND CKMB - Abnormal; Notable for the following:    Total CK 222 (*)     All other components within normal limits  PROTIME-INR  APTT  DIFFERENTIAL  TROPONIN I  URINE RAPID DRUG SCREEN (HOSP PERFORMED)  GLUCOSE, CAPILLARY   No results found.   1. TIA (transient ischemic attack)       MDM  Pt stable in ED with no  significant deterioration in condition.Patient / Family / Caregiver informed of clinical course, understand medical decision-making process, and agree with plan.        Hurman Horn, MD 02/11/12 [phone]

## 2012-02-05 NOTE — ED Notes (Signed)
Numbness in her left jaw x 4 days. Was seen by her MD and he thought she may have an infection. Was started on Amoxicillin. Numbness would come and go. Later that same day her left arm felt numb. Today her left foot and leg was numb. Drove herself here without difficulty. Feels dehydrated.

## 2012-02-05 NOTE — Discharge Instructions (Signed)
You may have had a transient ischemic attack (TIA). This means that the nervous system did not work properly for a short time. It is caused by a low oxygen supply to an area of your brain. It may be caused by a small blood clot or hardening of the arteries. This is a temporary neurologic condition. It usually gets better within thirty minutes, but always within twenty-four hours. If this does not resolve within that time period, it is defined as a stroke. TIA's are warning signs that you are at risk for having a stroke. A small percentage of patients who have had a TIA will have a stroke within a couple days, and up to 20% will have a stroke within 3 months. °SEEK IMMEDIATE MEDICAL ATTENTION (Call 911) IF: °The original symptoms that brought you in are getting worse, or if you develop any new change in speech, vision, swallowing, or understanding, incoordination, weakness, numbness, tingling, dizziness, fainting, severe headache, chest pain, or other concerns. Some patients who are having a stroke are eligible to receive a medication which may improve their outcome, but the drug usually must be given within three hours from when symptoms first occur. So if you think you might be having stroke symptoms, don't wait, call 911. °

## 2012-02-22 ENCOUNTER — Ambulatory Visit: Payer: No Typology Code available for payment source | Admitting: Physical Therapy

## 2012-03-24 ENCOUNTER — Other Ambulatory Visit: Payer: Self-pay | Admitting: Family Medicine

## 2012-03-24 DIAGNOSIS — E039 Hypothyroidism, unspecified: Secondary | ICD-10-CM

## 2012-03-24 DIAGNOSIS — Z1231 Encounter for screening mammogram for malignant neoplasm of breast: Secondary | ICD-10-CM

## 2012-03-30 ENCOUNTER — Ambulatory Visit
Admission: RE | Admit: 2012-03-30 | Discharge: 2012-03-30 | Disposition: A | Discharge status: Home / Self Care | Payer: BC Managed Care – PPO | Source: Ambulatory Visit | Attending: Family Medicine | Admitting: Family Medicine

## 2012-03-30 DIAGNOSIS — Z1231 Encounter for screening mammogram for malignant neoplasm of breast: Secondary | ICD-10-CM

## 2012-03-30 DIAGNOSIS — E039 Hypothyroidism, unspecified: Secondary | ICD-10-CM

## 2012-08-05 IMAGING — CR DG CHEST 2V
2 series · 2 of 2 positions shown · non-contrast
Comparison: 12/02/2011

CLINICAL DATA: Chest pain.

CHEST - 2 VIEW

[w chest pa]
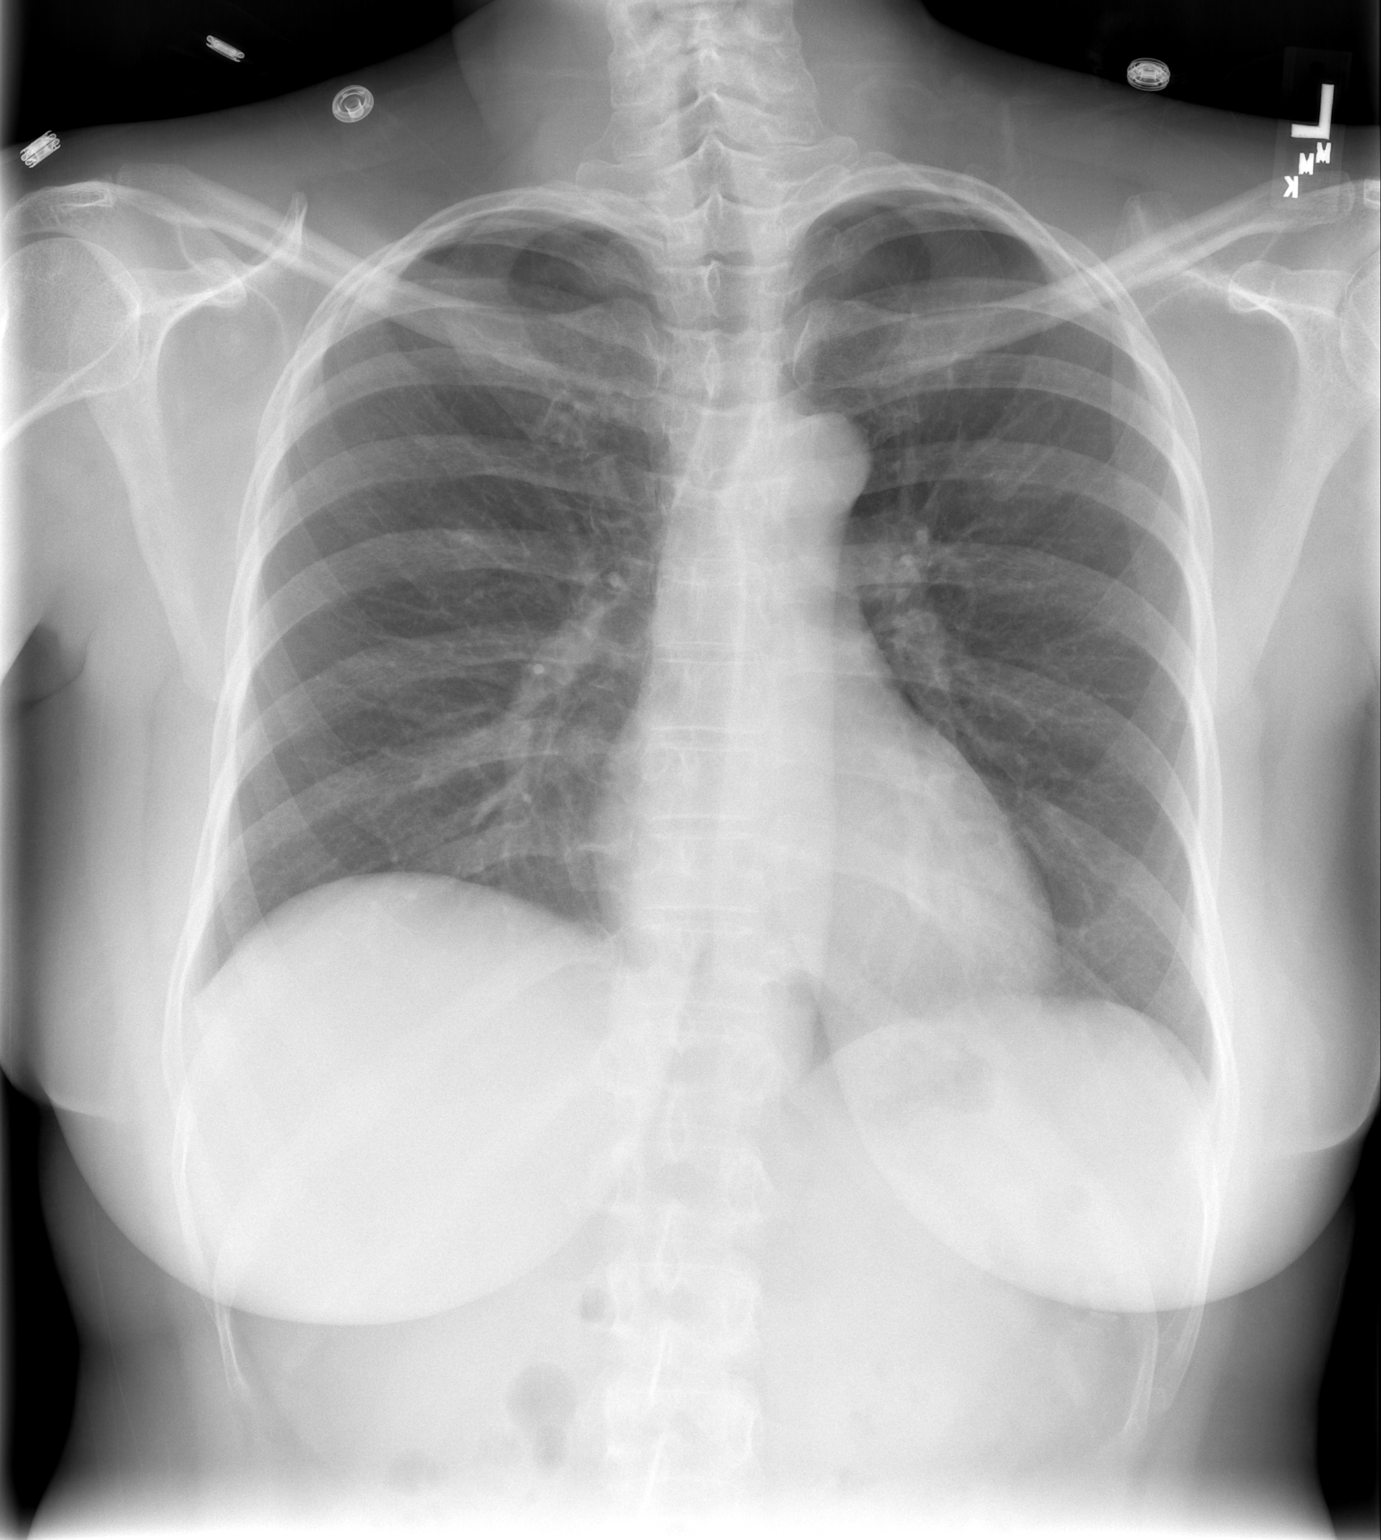

[w chest lat]
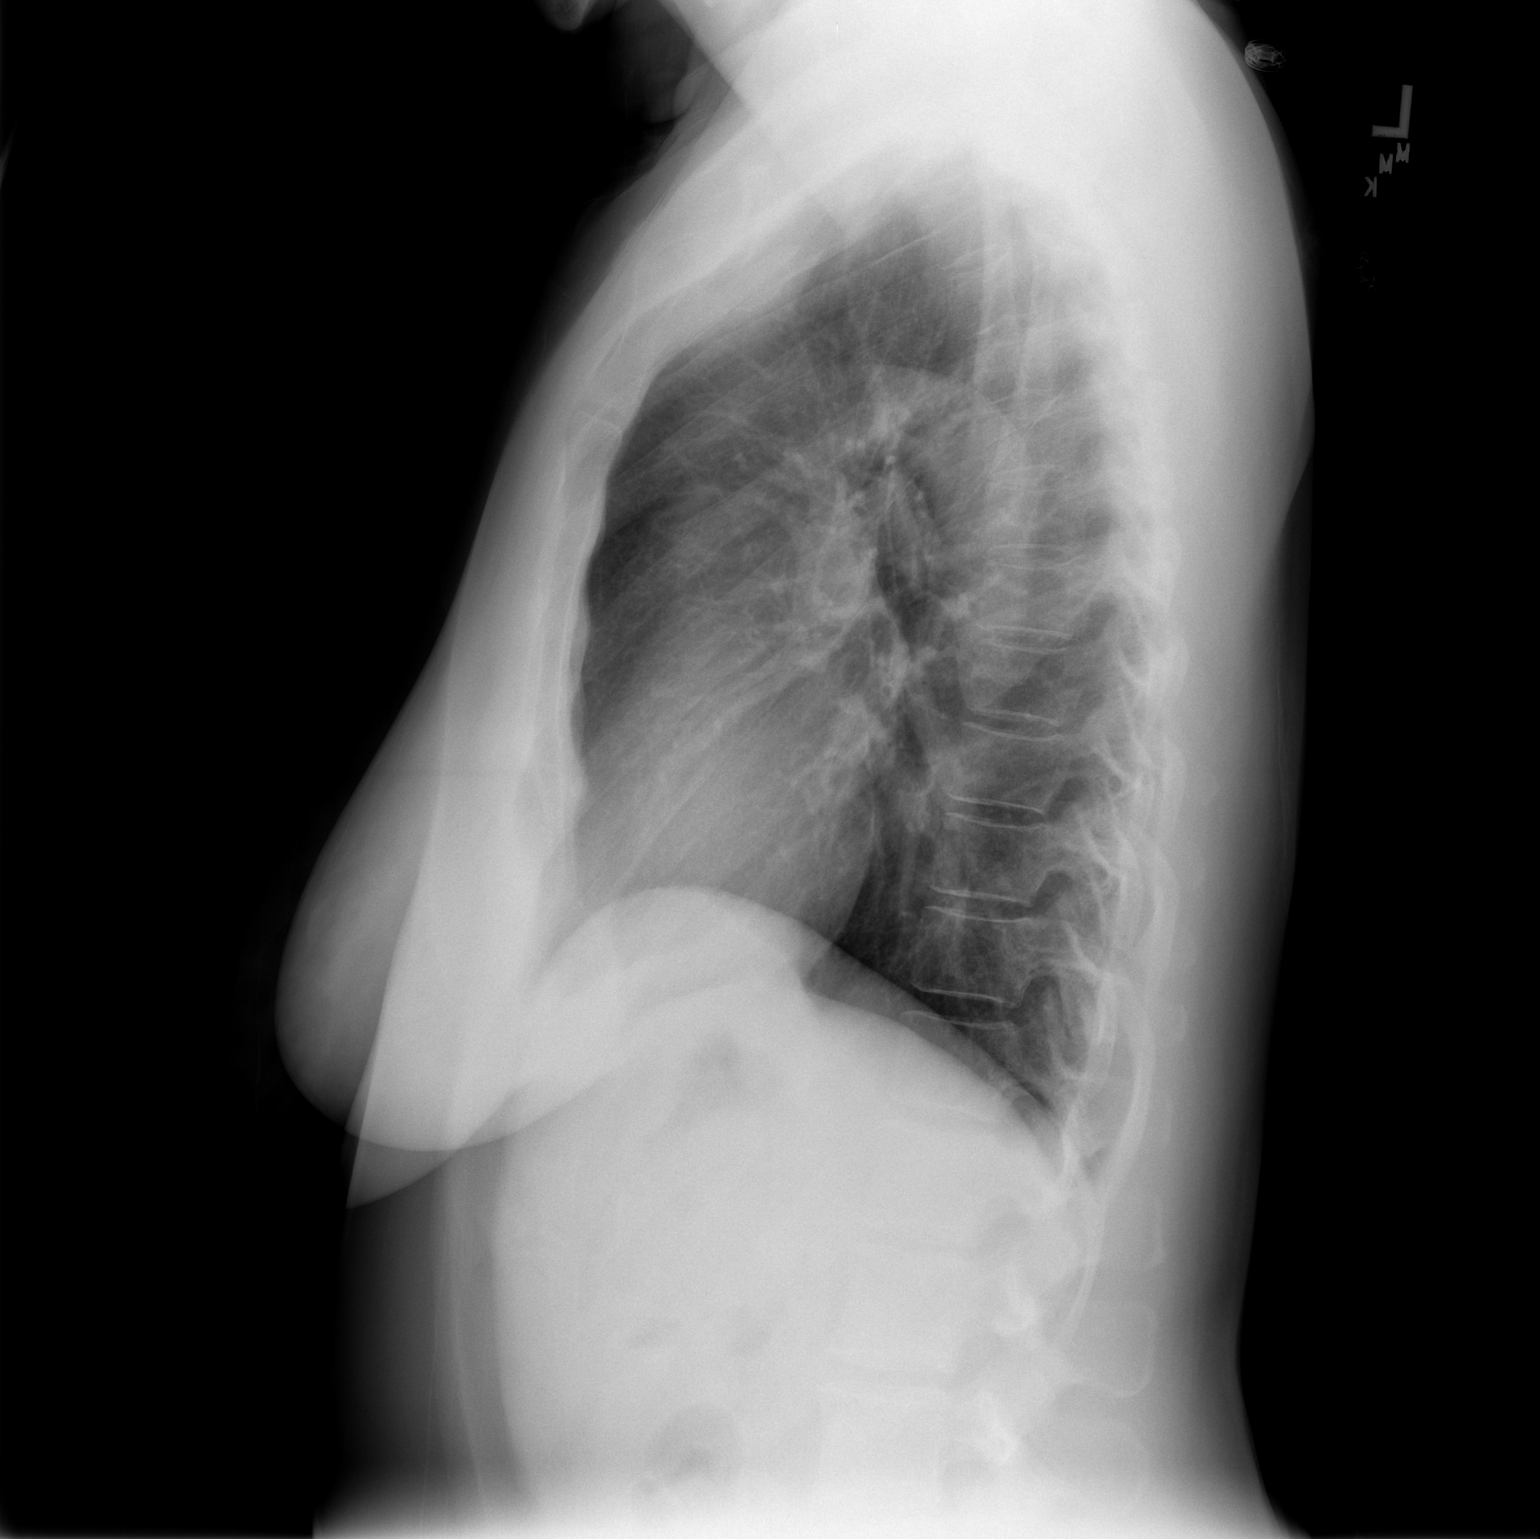

[2 of 2 positions shown; findings below may reference images not displayed]

FINDINGS: Heart and mediastinal contours are within normal limits.
No focal opacities or effusions.  No acute bony abnormality.
IMPRESSION: No active cardiopulmonary disease.

## 2012-08-20 IMAGING — CR DG CHEST 1V PORT
1 series · 1 of 1 positions shown · non-contrast
Comparison: 12/28/2011

CLINICAL DATA: Dyspnea

PORTABLE CHEST - 1 VIEW

[AP]
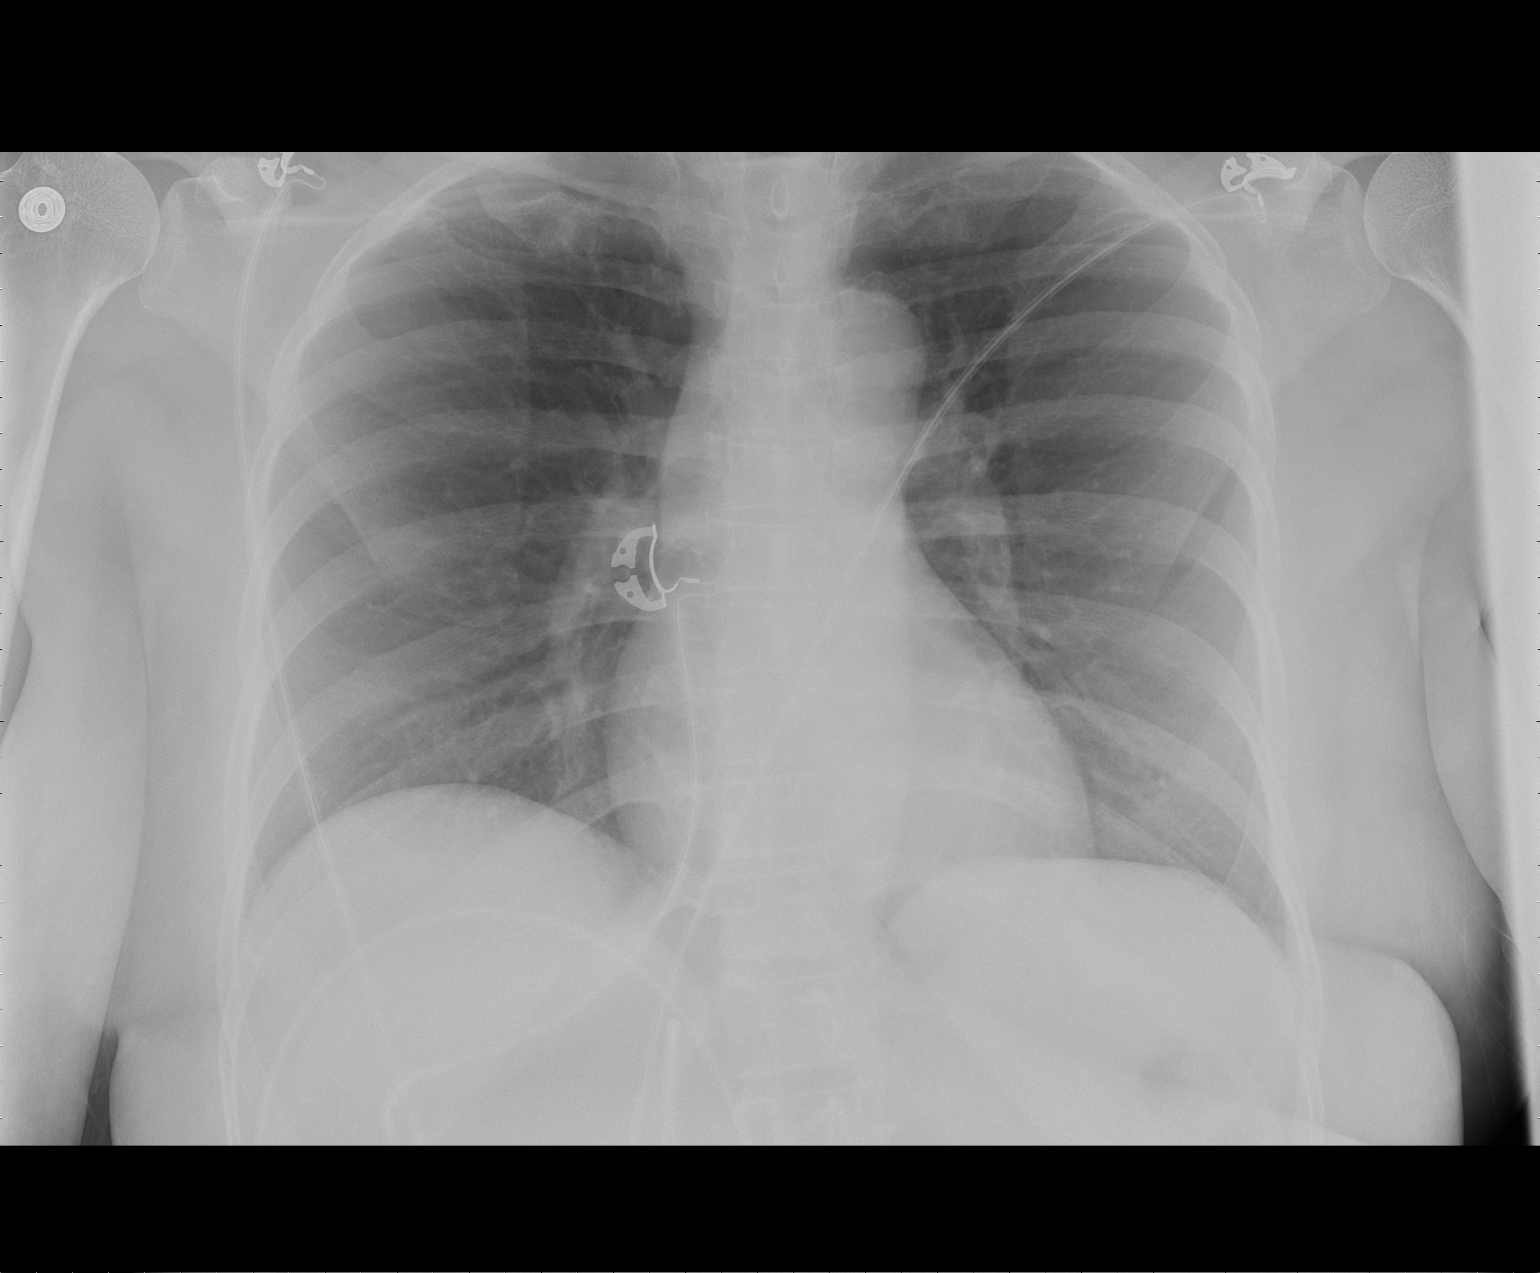

[1 of 1 positions shown; findings below may reference images not displayed]

FINDINGS: Normal heart size.  Clear lungs.
IMPRESSION: Negative.

## 2012-09-26 IMAGING — CT CT HEAD W/O CM
1 series · 16 of 30 positions shown, 20 images · non-contrast
Comparison: 10/19/2010 CT

CLINICAL DATA: 41-year-old female with left-sided facial numbness.

CT HEAD WITHOUT CONTRAST
TECHNIQUE: Contiguous axial images were obtained from the base of
the skull through the vertex without contrast.

[Series 2: head 4.8 h37s · axial · 0.45mm/px · z∈[-201,-49]mm · 16 of 36 slices shown, 20 images]
[im 2/36  brain]
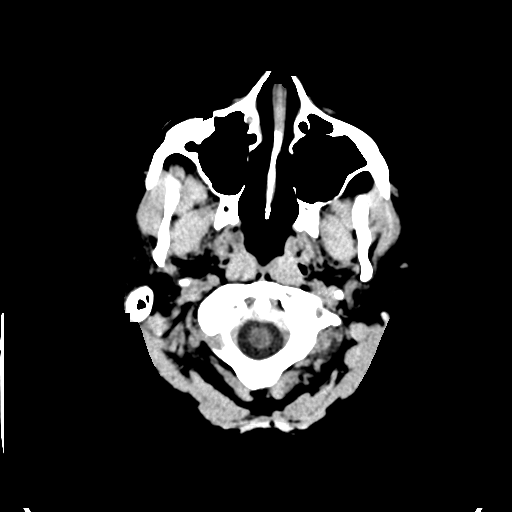
[im 2/36  bone]
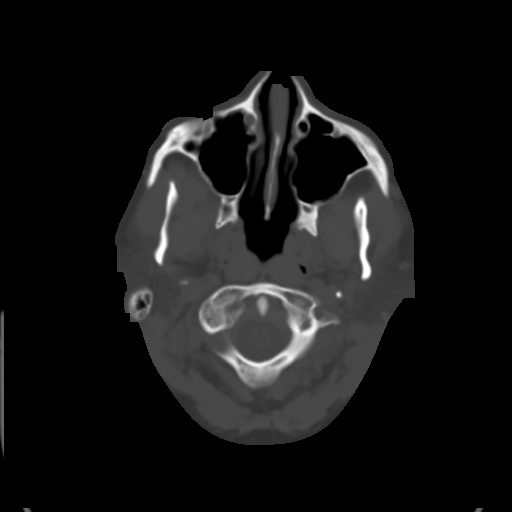
[im 4/36  brain]
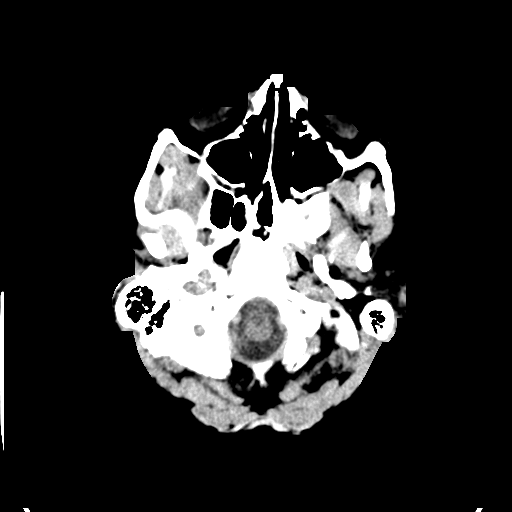
[im 7/36  brain]
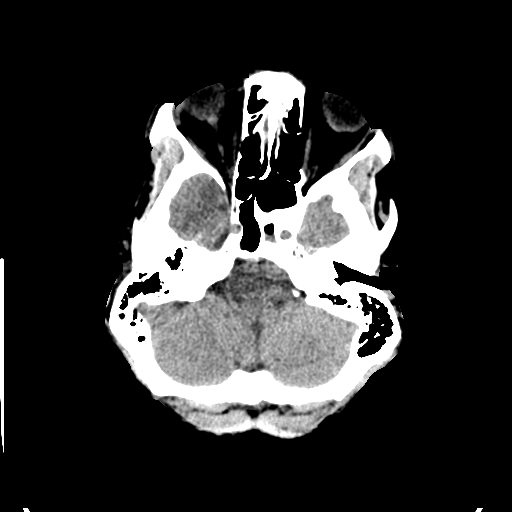
[im 9/36  brain]
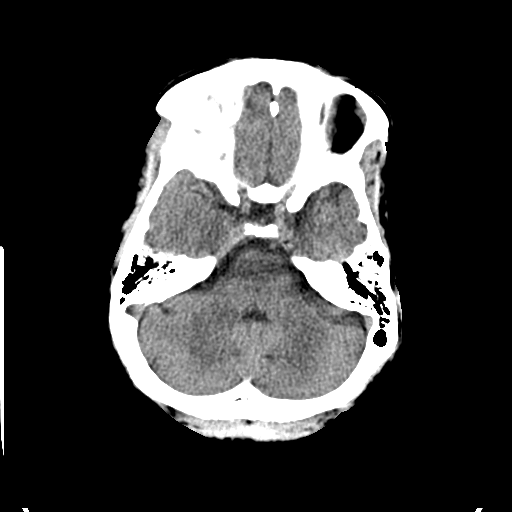
[im 10/36  brain]
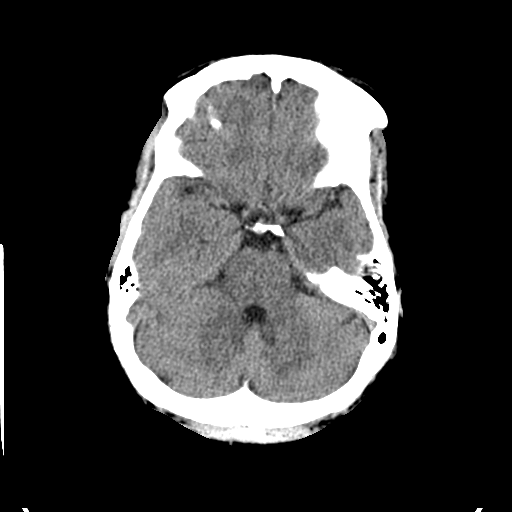
[im 10/36  bone]
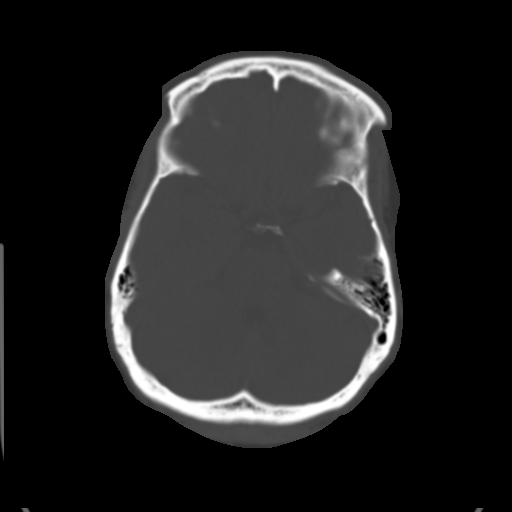
[im 13/36  brain]
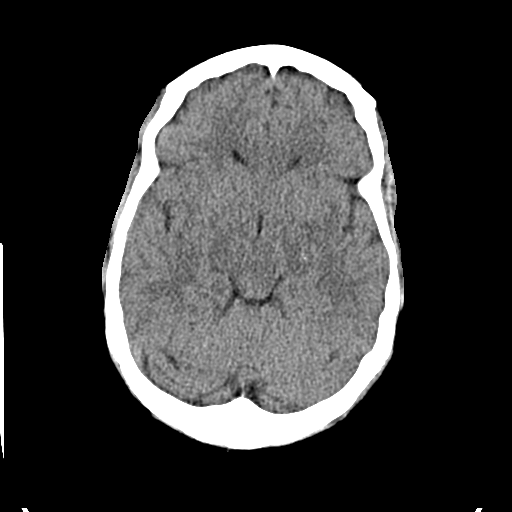
[im 15/36  brain]
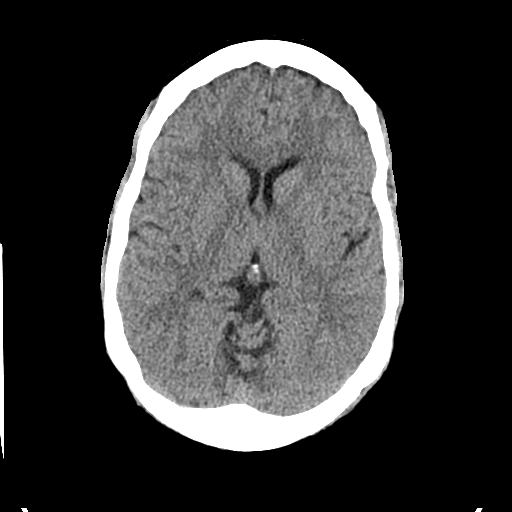
[im 17/36  brain]
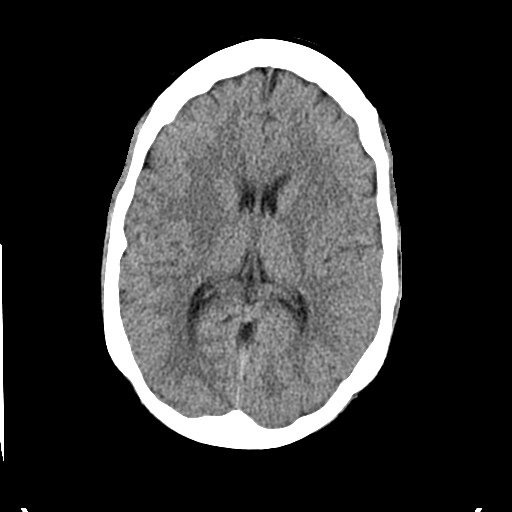
[im 19/36  brain]
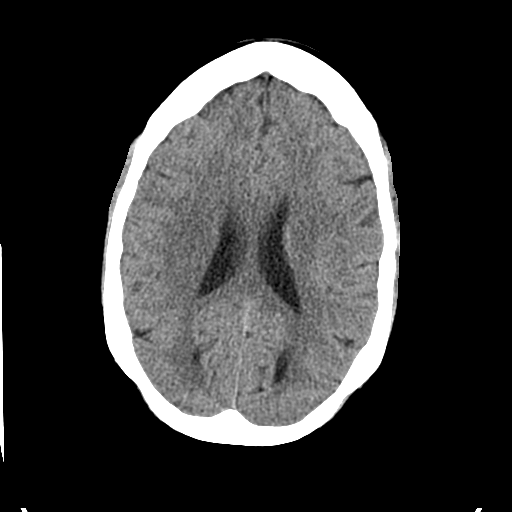
[im 19/36  bone]
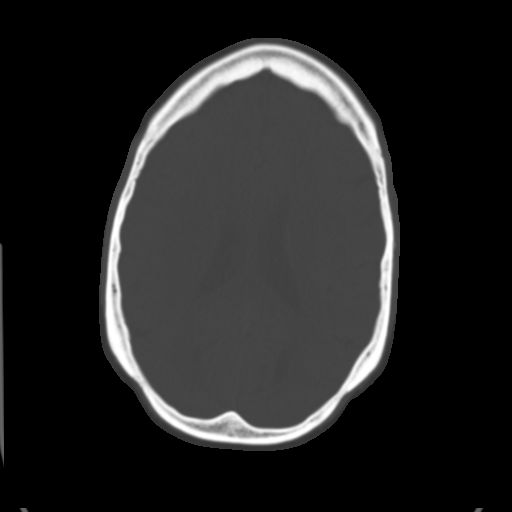
[im 21/36  brain]
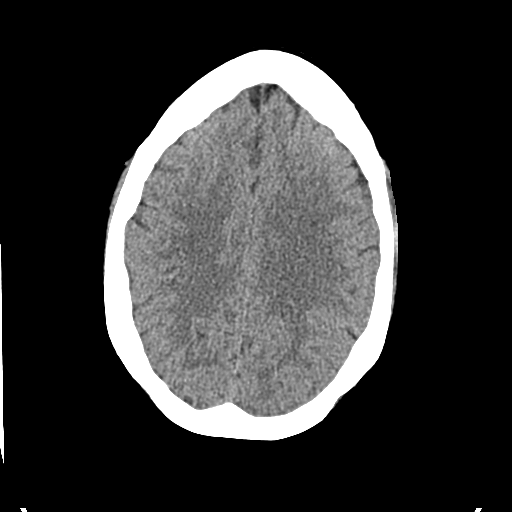
[im 23/36  brain]
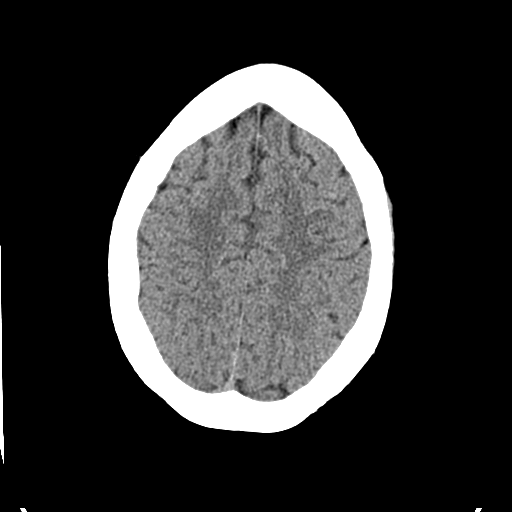
[im 26/36  brain]
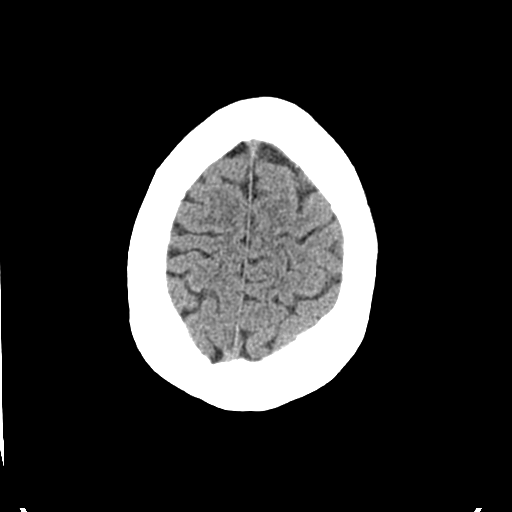
[im 27/36  brain]
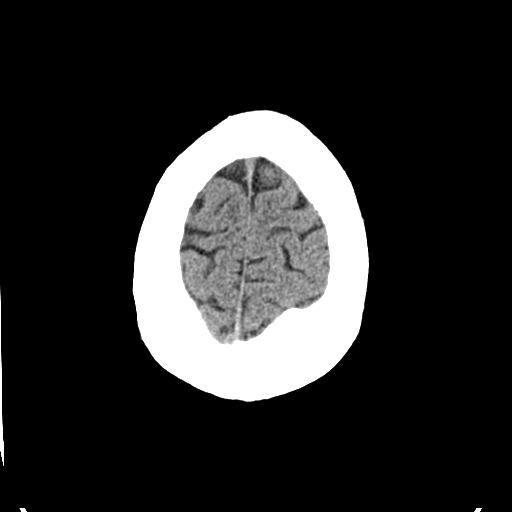
[im 27/36  bone]
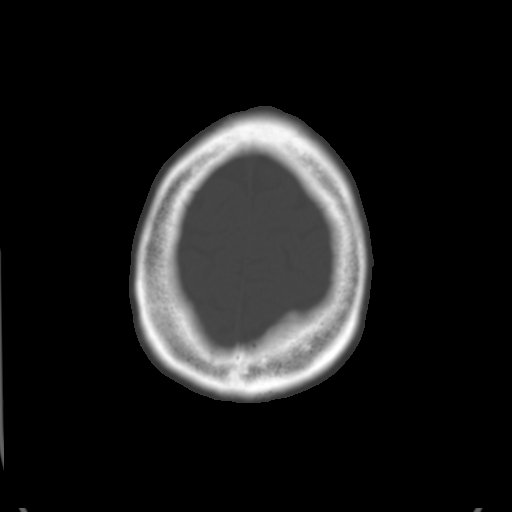
[im 29/36  brain]
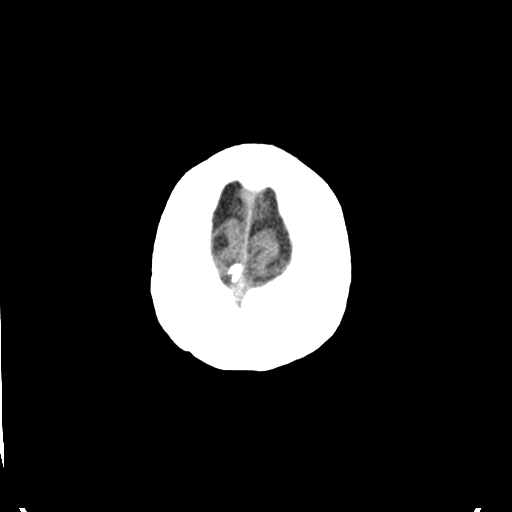
[im 32/36  brain]
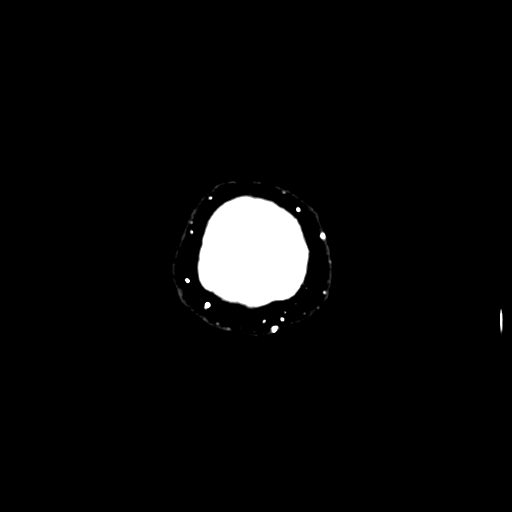
[im 34/36  brain]
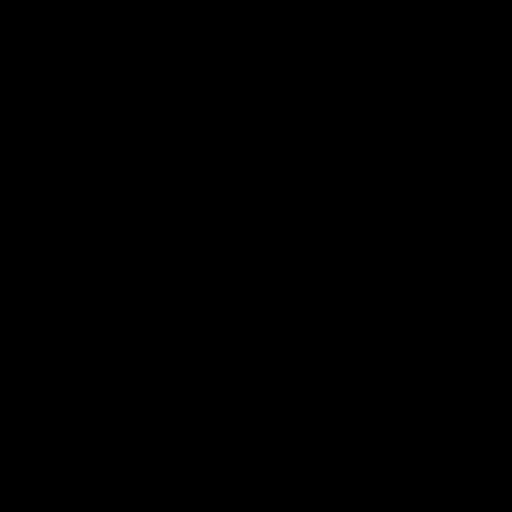

[16 of 30 positions shown; findings below may reference images not displayed]

FINDINGS: No intracranial abnormalities are identified, including
mass lesion or mass effect, hydrocephalus, extra-axial fluid
collection, midline shift, hemorrhage, or acute infarction.

The visualized bony calvarium is unremarkable.
IMPRESSION: Unremarkable noncontrast head CT.

## 2013-08-02 DIAGNOSIS — L910 Hypertrophic scar: Secondary | ICD-10-CM | POA: Insufficient documentation

## 2014-01-22 DIAGNOSIS — J301 Allergic rhinitis due to pollen: Secondary | ICD-10-CM | POA: Insufficient documentation

## 2014-01-22 DIAGNOSIS — J302 Other seasonal allergic rhinitis: Secondary | ICD-10-CM | POA: Insufficient documentation

## 2014-02-12 ENCOUNTER — Other Ambulatory Visit: Payer: Self-pay

## 2014-02-12 DIAGNOSIS — Z1231 Encounter for screening mammogram for malignant neoplasm of breast: Secondary | ICD-10-CM

## 2014-02-28 ENCOUNTER — Ambulatory Visit: Payer: BC Managed Care – PPO

## 2014-03-02 ENCOUNTER — Ambulatory Visit
Admission: RE | Admit: 2014-03-02 | Discharge: 2014-03-02 | Disposition: A | Discharge status: Home / Self Care | Payer: BC Managed Care – PPO | Source: Ambulatory Visit

## 2014-03-02 ENCOUNTER — Encounter (INDEPENDENT_AMBULATORY_CARE_PROVIDER_SITE_OTHER): Payer: Self-pay

## 2014-03-02 DIAGNOSIS — Z1231 Encounter for screening mammogram for malignant neoplasm of breast: Secondary | ICD-10-CM

## 2014-03-26 DIAGNOSIS — M2550 Pain in unspecified joint: Secondary | ICD-10-CM | POA: Insufficient documentation

## 2014-11-27 DIAGNOSIS — E782 Mixed hyperlipidemia: Secondary | ICD-10-CM | POA: Insufficient documentation

## 2015-02-22 ENCOUNTER — Ambulatory Visit: Payer: BC Managed Care – PPO | Admitting: Cardiology

## 2015-03-19 ENCOUNTER — Other Ambulatory Visit: Payer: Self-pay

## 2015-03-19 DIAGNOSIS — Z1231 Encounter for screening mammogram for malignant neoplasm of breast: Secondary | ICD-10-CM

## 2015-04-03 ENCOUNTER — Ambulatory Visit
Admission: RE | Admit: 2015-04-03 | Discharge: 2015-04-03 | Disposition: A | Discharge status: Home / Self Care | Payer: BC Managed Care – PPO | Source: Ambulatory Visit

## 2015-04-03 DIAGNOSIS — Z1231 Encounter for screening mammogram for malignant neoplasm of breast: Secondary | ICD-10-CM

## 2015-04-30 DIAGNOSIS — E559 Vitamin D deficiency, unspecified: Secondary | ICD-10-CM | POA: Insufficient documentation

## 2015-10-24 DIAGNOSIS — J45909 Unspecified asthma, uncomplicated: Secondary | ICD-10-CM | POA: Insufficient documentation

## 2015-11-19 DIAGNOSIS — F419 Anxiety disorder, unspecified: Secondary | ICD-10-CM | POA: Insufficient documentation

## 2016-02-05 DIAGNOSIS — E6609 Other obesity due to excess calories: Secondary | ICD-10-CM | POA: Insufficient documentation

## 2016-06-18 DIAGNOSIS — G43019 Migraine without aura, intractable, without status migrainosus: Secondary | ICD-10-CM | POA: Insufficient documentation

## 2016-06-18 DIAGNOSIS — D509 Iron deficiency anemia, unspecified: Secondary | ICD-10-CM | POA: Insufficient documentation

## 2016-08-03 DIAGNOSIS — R519 Headache, unspecified: Secondary | ICD-10-CM | POA: Insufficient documentation

## 2017-02-12 ENCOUNTER — Other Ambulatory Visit: Payer: Self-pay | Admitting: Obstetrics and Gynecology

## 2017-02-12 DIAGNOSIS — D250 Submucous leiomyoma of uterus: Secondary | ICD-10-CM

## 2017-02-19 DIAGNOSIS — L219 Seborrheic dermatitis, unspecified: Secondary | ICD-10-CM | POA: Insufficient documentation

## 2017-02-19 DIAGNOSIS — L209 Atopic dermatitis, unspecified: Secondary | ICD-10-CM | POA: Insufficient documentation

## 2017-03-03 ENCOUNTER — Other Ambulatory Visit: Payer: BC Managed Care – PPO

## 2017-03-24 ENCOUNTER — Ambulatory Visit
Admission: RE | Admit: 2017-03-24 | Discharge: 2017-03-24 | Disposition: A | Discharge status: Home / Self Care | Payer: BC Managed Care – PPO | Source: Ambulatory Visit | Attending: Obstetrics and Gynecology | Admitting: Obstetrics and Gynecology

## 2017-03-24 ENCOUNTER — Other Ambulatory Visit (HOSPITAL_COMMUNITY): Payer: Self-pay | Admitting: Interventional Radiology

## 2017-03-24 DIAGNOSIS — D573 Sickle-cell trait: Secondary | ICD-10-CM

## 2017-03-24 DIAGNOSIS — D250 Submucous leiomyoma of uterus: Secondary | ICD-10-CM

## 2017-03-24 HISTORY — PX: IR RADIOLOGIST EVAL & MGMT: IMG5224

## 2017-03-24 NOTE — Consult Note (Signed)
Chief Complaint: Patient was seen in consultation today for  Chief Complaint  Patient presents with  . Fibroids    Consult for [place]     at the request of Cousins,Sheronette  Referring Physician(s): Cousins,Sheronette  History of Present Illness: Jamie Barajas is a 46 y.o. female who presents for discussion of treatment options for uterine fibroids. She is accompanied by her husband. She was diagnosed with uterine fibroids about 2 years ago. She's had menorrhagia which is been progressive over the past 2 years. She describes periods every 30 days lasting 5-7 days with 3-4 days of heavy bleeding including clots, requiring changing maxi pads every 2-3 hours. No interperiod bleeding. No previous fibroid therapies or surgery. She does describe some bulk symptoms primarily abdominal/pelvic bloating, frequent urination and occasional urgency. Only previous gynecologic infections yeast infection. Endometrial biopsy last year revealed a only hyperplasia. Ultrasound last year revealed uterine fibroid. She is G2 P2, with no plans for future pregnancy.  Past Medical History:  Diagnosis Date  . Abnormal EKG    Decreased anterior R wave progression, nonspecific ST-T wave changes  . Anxiety   . Atrial tachycardia ([place])    History of atrial tachycardia prior to 2013.  [name] Chest pain    Nuclear, Dec 29, 2011, normal, EF 70%  . Headache(784.0)   . Hypertension   . Hyperthyroidism    2013, Patient seen extensively by endocrinology with treatment given. See hospital records  . Prolonged QT interval    QT interval is normal when the T wave is clear  . PTSD (post-traumatic stress disorder)   . Shortness of breath    Echo, May, 2013, EF 65%, no valvular abnormalities.  . Sickle cell trait ([place])   . SVT (supraventricular tachycardia) ([place])   . Uveitis     Past Surgical History:  Procedure Laterality Date  . KNEE ARTHROSCOPY W/ INTERNAL FIXATION TIBIAL SPINE FRACTURE  ~ 2011   Rght     Allergies: Azithromycin and Protonix [pantoprazole sodium]  Medications: Prior to Admission medications   Medication Sig Start Date End Date Taking? Authorizing Provider  amLODipine (NORVASC) 10 MG tablet Take 10 mg by mouth daily.   Yes [provider]  Cyanocobalamin (VITAMIN B-12 PO) Take 1 capsule by mouth daily.   Yes [provider]  FERROUS GLUCONATE PO Take 1 capsule by mouth daily.   Yes [provider]  LORazepam (ATIVAN) 0.5 MG tablet Take 0.5 mg by mouth daily as needed. For anxiety.   Yes [provider]  metoprolol succinate (TOPROL-XL) 25 MG 24 hr tablet Take 25 mg by mouth daily.   Yes [provider]  Vitamin D, Ergocalciferol, (DRISDOL) 50000 units CAPS capsule Take 50,000 Units by mouth every 7 (seven) days.   Yes [provider]  amoxicillin (AMOXIL) 875 MG tablet Take 875 mg by mouth 2 (two) times daily.    [provider]  diphenhydrAMINE (BENADRYL) 25 mg capsule Take 2 capsules (50 mg total) by mouth every 6 (six) hours as needed for itching. 01/03/12 01/13/12  Trisha Mangle, MD  famotidine (PEPCID) 20 MG tablet Take 1 tablet (20 mg total) by mouth 2 (two) times daily. 01/03/12 01/02/13  Trisha Mangle, MD  levalbuterol [facility] HFA) 45 MCG/ACT inhaler Inhale 2 puffs into the lungs every 3 (three) hours as needed for wheezing. 12/31/11 12/30/12  Barrett, Evelene Croon, PA-C  methimazole (TAPAZOLE) 10 MG tablet Take 30 mg by mouth daily.    [provider]  Family History  Problem Relation Age of Onset  . Stroke Mother   . Cancer Mother   . Hypertension Mother   . Sarcoidosis Mother   . Stroke Father   . Hypertension Father   . Hyperlipidemia Father     Social History   Social History  . Marital status: Married    Spouse name: N/A  . Number of children: 2  . Years of education: N/A   Occupational History  . [employer]   Social History Main Topics  . Smoking status: Never  Smoker  . Smokeless tobacco: Never Used  . Alcohol use No     Comment: 12/15/11 "glass of wine maybe 3 times/month"  . Drug use: No  . Sexual activity: Yes   Other Topics Concern  . None   Social History Narrative   Lives with husband in [place], [place].             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   ECOG Status: 1 - Symptomatic but completely ambulatory  Review of Systems: A 12 point ROS discussed and pertinent positives are indicated in the HPI above.  All other systems are negative.  Review of Systems  Vital Signs: BP 116/84   Pulse 67   Temp 98.8 F (37.1 C) (Oral)   Resp 14   Ht 5' 4.5" (1.638 m)   Wt 180 lb (81.6 kg)   LMP 02/28/2017 (Approximate)   SpO2 100%   BMI 30.42 kg/m   Physical Exam Constitutional: Oriented to person, place, and time. Well-developed and well-nourished. No distress.  HENT:  Head: Normocephalic and atraumatic.  Eyes: Conjunctivae and EOM are normal. Right eye exhibits no discharge. Left eye exhibits no discharge. No scleral icterus.  Neck: No JVD present.  Pulmonary/Chest: Effort normal. No stridor. No respiratory distress.  Abdomen: soft, non distended Neurological:  alert and oriented to person, place, and time.  Skin: Skin is warm and dry.  not diaphoretic.  Psychiatric:   normal mood and affect.   behavior is normal. Judgment and thought content normal.   Mallampati Score:     Imaging: No results found.  Labs:  CBC: No results for input(s): WBC, HGB, HCT, PLT in the last 8760 hours.  COAGS: No results for input(s): INR, APTT in the last 8760 hours.  BMP: No results for input(s): NA, K, CL, CO2, GLUCOSE, BUN, CALCIUM, CREATININE, GFRNONAA, GFRAA in the last 8760 hours.  Invalid input(s): CMP  LIVER FUNCTION TESTS: No results for input(s): BILITOT, AST, ALT, ALKPHOS, PROT, ALBUMIN in the last 8760 hours.  TUMOR MARKERS: No results for input(s): AFPTM, CEA, CA199, CHROMGRNA in the last 8760  hours.  Assessment and Plan:  My impression is that this patient's menorrhagia is  likely secondary to uterine fibroids. We spent the majority of the consultation discussing the pathophysiology of uterine leiomyomata, natural history, anticipated  involution post menopause, and treatment options. We discussed myomectomy, hysterectomy, and uterine fibroid embolization. I described the technique of UFE, anticipated benefits, possible risks and complications including but not limited to bleeding, infection, vessel damage, nontarget embolization, and incomplete symptom relief. We discussed the 90% clinical success rate historically and at our experience with UFE for menorrhagia. We discussed the post procedure course and time course of symptom resolution. We discussed the need for continued gynecologic care.  She and her husband seemed to understand and did ask appropriate questions, which were answered.  Based on her evaluation thus far, I think  she would be an appropriate candidate for uterine fibroid embolization because of her symptomatology and  uterine fibroids. To complete her evaluation and workup, I would require pelvic MRI with contrast to best determine the exact site of location of her uterine fibroids, specifically to exclude any pedunculated fibroids on a narrow stalk, as well as to exclude any unexpected pelvic pathology.   After our discussion, the patient was motivated proceed. Accordingly, we can set up the MRI   at her convenience as an outpatient. If this looks okay, she would like to proceed with UFE.   Thank you for this interesting consult.  I greatly enjoyed meeting Jamie Barajas and look forward to participating in their care.  A copy of this report was sent to the requesting provider on this date.  Electronically Signed: Jerzie Bieri III, DAYNE Kron [place] 03/24/2017, 9:50 AM   I spent a total of  30 Minutes   in face to face in clinical consultation, greater than 50% of which was  counseling/coordinating care for symptomatic uterine fibroids.

## 2017-04-10 ENCOUNTER — Encounter (HOSPITAL_BASED_OUTPATIENT_CLINIC_OR_DEPARTMENT_OTHER): Payer: Self-pay

## 2017-04-10 ENCOUNTER — Ambulatory Visit (HOSPITAL_BASED_OUTPATIENT_CLINIC_OR_DEPARTMENT_OTHER)
Admission: RE | Admit: 2017-04-10 | Discharge: 2017-04-10 | Disposition: A | Discharge status: Home / Self Care | Payer: BC Managed Care – PPO | Source: Ambulatory Visit | Attending: Interventional Radiology | Admitting: Interventional Radiology

## 2017-05-31 ENCOUNTER — Encounter: Payer: Self-pay | Admitting: Interventional Radiology

## 2018-02-03 DIAGNOSIS — R5381 Other malaise: Secondary | ICD-10-CM | POA: Insufficient documentation

## 2018-03-20 ENCOUNTER — Other Ambulatory Visit: Payer: Self-pay

## 2018-03-20 ENCOUNTER — Emergency Department (HOSPITAL_BASED_OUTPATIENT_CLINIC_OR_DEPARTMENT_OTHER)
Admission: EM | Admit: 2018-03-20 | Discharge: 2018-03-20 | Disposition: A | Discharge status: Home / Self Care | Payer: BC Managed Care – PPO | Attending: Emergency Medicine | Admitting: Emergency Medicine

## 2018-03-20 ENCOUNTER — Encounter (HOSPITAL_BASED_OUTPATIENT_CLINIC_OR_DEPARTMENT_OTHER): Payer: Self-pay | Admitting: Emergency Medicine

## 2018-03-20 DIAGNOSIS — Y999 Unspecified external cause status: Secondary | ICD-10-CM | POA: Insufficient documentation

## 2018-03-20 DIAGNOSIS — Z79899 Other long term (current) drug therapy: Secondary | ICD-10-CM | POA: Insufficient documentation

## 2018-03-20 DIAGNOSIS — T1490XA Injury, unspecified, initial encounter: Secondary | ICD-10-CM | POA: Insufficient documentation

## 2018-03-20 DIAGNOSIS — I10 Essential (primary) hypertension: Secondary | ICD-10-CM | POA: Insufficient documentation

## 2018-03-20 DIAGNOSIS — T148XXA Other injury of unspecified body region, initial encounter: Secondary | ICD-10-CM

## 2018-03-20 DIAGNOSIS — E0590 Thyrotoxicosis, unspecified without thyrotoxic crisis or storm: Secondary | ICD-10-CM | POA: Diagnosis not present

## 2018-03-20 DIAGNOSIS — Y939 Activity, unspecified: Secondary | ICD-10-CM | POA: Diagnosis not present

## 2018-03-20 DIAGNOSIS — Y92410 Unspecified street and highway as the place of occurrence of the external cause: Secondary | ICD-10-CM | POA: Insufficient documentation

## 2018-03-20 MED ORDER — METHOCARBAMOL 500 MG PO TABS: 500.0000 mg | ORAL_TABLET | Freq: Two times a day (BID) | ORAL | 0 refills | Status: DC

## 2018-03-20 MED ORDER — ACETAMINOPHEN 500 MG PO TABS
1000.0000 mg | ORAL_TABLET | Freq: Once | ORAL | Status: AC
  Administered 2018-03-20: 1000 mg via ORAL
  Filled 2018-03-20: qty 2
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
## 2018-03-20 NOTE — Discharge Instructions (Signed)
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
## 2019-03-23 ENCOUNTER — Other Ambulatory Visit: Payer: Self-pay | Admitting: Obstetrics and Gynecology

## 2019-03-23 ENCOUNTER — Other Ambulatory Visit: Payer: Self-pay | Admitting: *Deleted

## 2019-03-23 ENCOUNTER — Other Ambulatory Visit: Payer: Self-pay | Admitting: Interventional Radiology

## 2019-03-23 DIAGNOSIS — D259 Leiomyoma of uterus, unspecified: Secondary | ICD-10-CM

## 2019-03-23 DIAGNOSIS — D250 Submucous leiomyoma of uterus: Secondary | ICD-10-CM

## 2019-04-01 ENCOUNTER — Ambulatory Visit (HOSPITAL_BASED_OUTPATIENT_CLINIC_OR_DEPARTMENT_OTHER): Payer: BC Managed Care – PPO

## 2019-04-06 ENCOUNTER — Other Ambulatory Visit: Payer: Self-pay | Admitting: Obstetrics and Gynecology

## 2019-04-08 ENCOUNTER — Ambulatory Visit (HOSPITAL_BASED_OUTPATIENT_CLINIC_OR_DEPARTMENT_OTHER): Payer: BC Managed Care – PPO

## 2019-04-08 ENCOUNTER — Encounter (HOSPITAL_BASED_OUTPATIENT_CLINIC_OR_DEPARTMENT_OTHER): Payer: Self-pay

## 2019-04-11 ENCOUNTER — Ambulatory Visit (HOSPITAL_COMMUNITY)
Admission: RE | Admit: 2019-04-11 | Discharge: 2019-04-11 | Disposition: A | Discharge status: Home / Self Care | Payer: BC Managed Care – PPO | Source: Ambulatory Visit | Attending: Obstetrics and Gynecology | Admitting: Obstetrics and Gynecology

## 2019-04-11 ENCOUNTER — Other Ambulatory Visit: Payer: Self-pay

## 2019-04-11 DIAGNOSIS — D509 Iron deficiency anemia, unspecified: Secondary | ICD-10-CM | POA: Diagnosis not present

## 2019-04-11 MED ORDER — SODIUM CHLORIDE 0.9 % IV SOLN
510.0000 mg | Freq: Once | INTRAVENOUS | Status: AC
  Administered 2019-04-11: 510 mg via INTRAVENOUS
  Filled 2019-04-11: qty 17

## 2019-04-11 MED ORDER — SODIUM CHLORIDE 0.9 % IV SOLN
INTRAVENOUS | Status: DC | PRN
  Administered 2019-04-11: 250 mL via INTRAVENOUS

## 2019-04-11 NOTE — Progress Notes (Signed)
PATIENT CARE CENTER NOTE  Diagnosis: Iron Deficiency Anemia   Provider: Cousins, Sheronette, MD   Procedure: IV Feraheme    Note: Patient received Feraheme infusion via PIV. Tolerated well. Observed patient for 30 minutes post-infusion. Vital signs stable. Discharge instructions given. Patient to come back next week for second infusion. Alert, oriented and ambulatory at discharge.   

## 2019-04-11 NOTE — Discharge Instructions (Signed)

## 2019-04-18 ENCOUNTER — Other Ambulatory Visit: Payer: Self-pay

## 2019-04-18 ENCOUNTER — Ambulatory Visit (HOSPITAL_COMMUNITY)
Admission: RE | Admit: 2019-04-18 | Discharge: 2019-04-18 | Disposition: A | Discharge status: Home / Self Care | Payer: BC Managed Care – PPO | Source: Ambulatory Visit | Attending: Internal Medicine | Admitting: Internal Medicine

## 2019-04-18 DIAGNOSIS — D509 Iron deficiency anemia, unspecified: Secondary | ICD-10-CM | POA: Diagnosis not present

## 2019-04-18 MED ORDER — SODIUM CHLORIDE 0.9 % IV SOLN
INTRAVENOUS | Status: DC | PRN
  Administered 2019-04-18: 250 mL via INTRAVENOUS

## 2019-04-18 MED ORDER — SODIUM CHLORIDE 0.9 % IV SOLN
510.0000 mg | Freq: Once | INTRAVENOUS | Status: AC
  Administered 2019-04-18: 510 mg via INTRAVENOUS
  Filled 2019-04-18: qty 17

## 2019-04-18 NOTE — Progress Notes (Signed)
PATIENT CARE CENTER NOTE  Diagnosis: Iron Deficiency Anemia    Provider: Servando Salina, MD   Procedure: IV Feraheme    Note: Patient received Feraheme infusion via PIV. Tolerated well. Observed patient for 30 minutes post-infusion. Vital signs stable. Discharge instructions given.  Alert, oriented and ambulatory at discharge.

## 2019-04-18 NOTE — Discharge Instructions (Signed)

## 2019-07-03 ENCOUNTER — Ambulatory Visit (INDEPENDENT_AMBULATORY_CARE_PROVIDER_SITE_OTHER): Payer: BC Managed Care – PPO | Admitting: Family Medicine

## 2019-07-03 ENCOUNTER — Encounter (INDEPENDENT_AMBULATORY_CARE_PROVIDER_SITE_OTHER): Payer: Self-pay | Admitting: Family Medicine

## 2019-07-03 ENCOUNTER — Encounter: Payer: Self-pay | Admitting: Family Medicine

## 2019-07-03 ENCOUNTER — Other Ambulatory Visit: Payer: Self-pay

## 2019-07-03 VITALS — BP 154/91 | HR 96 | Temp 98.1°F | Ht 64.0 in | Wt 186.0 lb

## 2019-07-03 DIAGNOSIS — Z9189 Other specified personal risk factors, not elsewhere classified: Secondary | ICD-10-CM | POA: Diagnosis not present

## 2019-07-03 DIAGNOSIS — Z6832 Body mass index (BMI) 32.0-32.9, adult: Secondary | ICD-10-CM

## 2019-07-03 DIAGNOSIS — R5383 Other fatigue: Secondary | ICD-10-CM | POA: Diagnosis not present

## 2019-07-03 DIAGNOSIS — Z1331 Encounter for screening for depression: Secondary | ICD-10-CM

## 2019-07-03 DIAGNOSIS — R0602 Shortness of breath: Secondary | ICD-10-CM | POA: Diagnosis not present

## 2019-07-03 DIAGNOSIS — E786 Lipoprotein deficiency: Secondary | ICD-10-CM

## 2019-07-03 DIAGNOSIS — E559 Vitamin D deficiency, unspecified: Secondary | ICD-10-CM

## 2019-07-03 DIAGNOSIS — I10 Essential (primary) hypertension: Secondary | ICD-10-CM

## 2019-07-03 DIAGNOSIS — E669 Obesity, unspecified: Secondary | ICD-10-CM

## 2019-07-03 DIAGNOSIS — Z0289 Encounter for other administrative examinations: Secondary | ICD-10-CM

## 2019-07-03 DIAGNOSIS — N920 Excessive and frequent menstruation with regular cycle: Secondary | ICD-10-CM

## 2019-07-04 NOTE — Progress Notes (Signed)
Office: [phone]  /  Fax: [phone]   Dear Dr. Garwin Barajas,   Thank you for referring Jamie Barajas Barajas to our clinic. The following note includes my evaluation and treatment recommendations.  HPI:   Chief Complaint: OBESITY    Jamie Barajas Barajas has been referred by Jamie Barajas [place], MD for consultation regarding her obesity and obesity related comorbidities.    Jamie Barajas Barajas (MR# [id number]) Barajas a 48 y.o. female who presents on 07/03/2019 for obesity evaluation and treatment. Current BMI Barajas Body mass index Barajas 31.93 kg/m. Jamie Barajas Barajas has been struggling with her weight for many years and has been unsuccessful in either losing weight, maintaining weight loss, or reaching her healthy weight goal.     Jamie Barajas Barajas states she Barajas currently in the action stage of change and ready to dedicate time achieving and maintaining a healthier weight. Jamie Barajas Barajas Barajas interested in becoming our patient and working on intensive lifestyle modifications including (but not limited to) diet, exercise and weight loss.    Jamie Barajas Barajas states her family eats meals together she thinks her family will eat healthier with  her her desired weight loss Barajas 61 lbs she started gaining weight in her 69's her heaviest weight ever was 190 lbs she Barajas a picky eater and doesn't like to eat healthier foods  she has significant food cravings issues  she Barajas frequently drinking liquids with calories she frequently makes poor food choices she struggles with emotional eating    Jamie Barajas Barajas feels her energy Barajas lower than it should be. She has a history of anemia. This has worsened with weight gain and has not worsened recently. Jamie Barajas Barajas admits to daytime somnolence and  admits to waking up still tired. Patient Barajas at risk for obstructive sleep apnea. Patent has a history of symptoms of daytime Jamie Barajas. Patient generally gets 5 hours of sleep per night, and states they generally have difficulty falling asleep. Snoring Barajas present. Apneic  episodes are not present. Epworth Sleepiness Score Barajas 5.  Dyspnea on exertion Jamie Barajas Barajas notes increasing shortness of breath with exercising and seems to be worsening over time with weight gain. She notes getting out of breath sooner with activity than she used to. This has not gotten worse recently. Jamie Barajas Barajas denies orthopnea.  Hypertension Jamie Barajas Barajas Barajas a 48 y.o. female with hypertension. Jamie Barajas Barajas Barajas taking amlodipine and metiprolol. Her blood pressure Barajas stable She denies chest pain. She Barajas working on weight loss to help control her blood pressure with the goal of decreasing her risk of heart attack and stroke.   Heavy Menses Jamie Barajas Barajas has a history of fibroids and Jamie Barajas MCV. She Barajas considering a hysterectomy. She has a sickle cell trait so her iron level stays Jamie Barajas. She Barajas status post iron infusion x 2 a few months ago.  Jamie Barajas Barajas HDL was Jamie Barajas at 31 on 03/05/17. She has been trying to improve her cholesterol levels with intensive lifestyle modification including a Jamie Barajas saturated fat diet, exercise and weight loss. She denies any chest pain, claudication or myalgias.  Vitamin D Deficiency Jamie Barajas Barajas has a diagnosis of vitamin D deficiency. She Barajas currently taking Vit D as needed per patient. Last Vit D level was 24 on 06/18/16. She denies nausea, vomiting or muscle weakness.  Depression Screen Jamie Barajas Barajas's Food and Mood (modified PHQ-9) score was  Depression screen PHQ 2/9 07/03/2019  Decreased Interest 2  Down, Depressed, Hopeless 1  PHQ - 2 Score 3  Altered sleeping 2  Tired, decreased energy 2  Change in appetite 1  Feeling  bad or failure about yourself  1  Trouble concentrating 1  Moving slowly or fidgety/restless 0  Suicidal thoughts 0  PHQ-9 Score 10  Difficult doing work/chores Not difficult at all   At risk for obstructive sleep apnea Jamie Barajas Barajas at higher risk of obstructive sleep apnea due to obesity.   ASSESSMENT AND PLAN:  Other Jamie Barajas - Plan: EKG 12-Lead, Anemia panel, T3, T4,  free, TSH, CBC w/Diff/Platelet, Comprehensive Metabolic Panel (CMET), HgB A1c, Insulin, random  Shortness of breath on exertion  Essential hypertension  Vitamin D deficiency - Plan: Vitamin D (25 hydroxy)  Jamie Barajas HDL (under 40) - Plan: Lipid Panel With LDL/HDL Ratio  Menorrhagia with regular cycle  Depression screening  At risk for osteoporosis  Class 1 obesity with serious comorbidity and body mass index (BMI) of 32.0 to 32.9 in adult, unspecified obesity type  PLAN:  Jamie Barajas Barajas was informed that her Jamie Barajas may be related to obesity, depression or many other causes. Labs will be ordered, and in the meanwhile Jamie Barajas Barajas has agreed to work on diet, exercise and weight loss to help with Jamie Barajas. Proper sleep hygiene was discussed including the need for 7-8 hours of quality sleep each night. A sleep study was not ordered based on symptoms and Epworth score.  Dyspnea on exertion Jamie Barajas's shortness of breath appears to be obesity related and exercise induced. She has agreed to work on weight loss and gradually increase exercise to treat her exercise induced shortness of breath. If Jamie Barajas Barajas follows our instructions and loses weight without improvement of her shortness of breath, we will plan to refer to pulmonology. We will monitor this condition regularly. Jamie Barajas Barajas agrees to this plan.  Hypertension We discussed sodium restriction, working on healthy weight loss, and a regular exercise program as the means to achieve improved blood pressure control. Jamie Barajas Barajas agreed with this plan and agreed to follow up as directed. We will continue to monitor her blood pressure as well as her progress with the above lifestyle modifications. Jamie Barajas Barajas will continue her current medications as prescribed and will watch for signs of hypotension as she continues her lifestyle modifications. Jamie Barajas agrees to follow up with our clinic in 2 to 3 weeks.  Heavy Menses We will check CBC and anemia panel today. Jamie Barajas Barajas agrees to follow up  with our clinic in 2 to 3 weeks.  Jamie Barajas HDL Jamie Barajas Barajas was informed of the American Heart Association Guidelines emphasizing intensive lifestyle modifications as the first line treatment for hyperlipidemia. We discussed many lifestyle modifications today in depth, and Jamie Barajas Barajas will continue to work on decreasing saturated fats such as fatty red meat, butter and many fried foods. She will also increase vegetables and lean protein in her diet and continue to work on exercise and weight loss efforts. We will check labs today.  Vitamin D Deficiency Jamie Barajas Barajas was informed that Jamie Barajas vitamin D levels contributes to Jamie Barajas and are associated with obesity, breast, and colon cancer. She will follow up for routine testing of vitamin D, at least 2-3 times per year. She was informed of the risk of over-replacement of vitamin D and agrees to not increase her dose unless she discusses this with [place] first. We will check labs today.  Depression Screen Jamie Barajas Barajas had a moderately positive depression screening. Depression Barajas commonly associated with obesity and often results in emotional eating behaviors. We will monitor this closely and work on CBT to help improve the non-hunger eating patterns. Referral to Psychology may be required if no improvement Barajas seen as she  continues in our clinic.  Obstructive Sleep Apnea Risk Counseling Jamie Barajas Barajas was given extended (15 minutes) coronary artery disease prevention counseling today. She Barajas 48 y.o. female and has risk factors for obstructive sleep apnea including obesity. We discussed intensive lifestyle modifications today with an emphasis on specific weight loss instructions and strategies.  Obesity Jamie Barajas Barajas Barajas currently in the action stage of change and her goal Barajas to continue with weight loss efforts. I recommend Jamie Barajas Barajas begin the structured treatment plan as follows:  She has agreed to follow the Category 2 plan Jamie Barajas Barajas has been instructed to eventually work up to a goal of 150 minutes of combined  cardio and strengthening exercise per week or as tolerated for weight loss and overall health benefits. We discussed the following Behavioral Modification Strategies today: increasing lean protein intake, decreasing simple carbohydrates, increasing vegetables, increase H20 intake, work on meal planning and easy cooking plans, holiday eating strategies, and decrease liquid calories   She was informed of the importance of frequent follow up visits to maximize her success with intensive lifestyle modifications for her multiple health conditions. She was informed we would discuss her lab results at her next visit unless there Barajas a critical issue that needs to be addressed sooner. Jamie Barajas Barajas agreed to keep her next visit at the agreed upon time to discuss these results.  ALLERGIES: Allergies  Allergen Reactions  . Azithromycin   . Protonix [Pantoprazole Sodium] Itching    MEDICATIONS: Current Outpatient Medications on File Prior to Visit  Medication Sig Dispense Refill  . amLODipine (NORVASC) 10 MG tablet Take 10 mg by mouth daily.    [name] amoxicillin (AMOXIL) 875 MG tablet Take 875 mg by mouth 2 (two) times daily.    [name] LORazepam (ATIVAN) 0.5 MG tablet Take 0.5 mg by mouth daily as needed. For anxiety.    . metoprolol succinate (TOPROL-XL) 25 MG 24 hr tablet Take 25 mg by mouth daily.    . Vitamin D, Ergocalciferol, (DRISDOL) 50000 units CAPS capsule Take 50,000 Units by mouth every 7 (seven) days.    . diphenhydrAMINE (BENADRYL) 25 mg capsule Take 2 capsules (50 mg total) by mouth every 6 (six) hours as needed for itching. 30 capsule 0  . famotidine (PEPCID) 20 MG tablet Take 1 tablet (20 mg total) by mouth 2 (two) times daily. 30 tablet 0  . levalbuterol (XOPENEX HFA) 45 MCG/ACT inhaler Inhale 2 puffs into the lungs every 3 (three) hours as needed for wheezing. 1 Inhaler 1   No current facility-administered medications on file prior to visit.     PAST MEDICAL HISTORY: Past Medical History:    Diagnosis Date  . Abnormal EKG    Decreased anterior R wave progression, nonspecific ST-T wave changes  . Anemia   . Anxiety   . Atrial tachycardia ([place])    History of atrial tachycardia prior to 2013.  . Bilateral swelling of feet   . Chest pain    Nuclear, Dec 29, 2011, normal, EF 70%  . Fibroids   . Headache(784.0)   . Hypertension   . Hyperthyroidism    2013, Patient seen extensively by endocrinology with treatment given. See hospital records  . Obesity   . Palpitations   . Prolonged QT interval    QT interval Barajas normal when the T wave Barajas clear  . PTSD (post-traumatic stress disorder)   . Shortness of breath    Echo, May, 2013, EF 65%, no valvular abnormalities.  . Shortness of breath   . Sickle  cell trait ([place])   . SVT (supraventricular tachycardia) ([place])   . Uveitis   . Vitamin B 12 deficiency   . Vitamin D deficiency     PAST SURGICAL HISTORY: Past Surgical History:  Procedure Laterality Date  . IR RADIOLOGIST EVAL & MGMT  03/24/2017  . KNEE ARTHROSCOPY W/ INTERNAL FIXATION TIBIAL SPINE FRACTURE  ~ 2011   Rght    SOCIAL HISTORY: Social History   Tobacco Use  . Smoking status: Never Smoker  . Smokeless tobacco: Never Used  Substance Use Topics  . Alcohol use: No    Comment: 12/15/11 "glass of wine maybe 3 times/month"  . Drug use: No    FAMILY HISTORY: Family History  Problem Relation Age of Onset  . Stroke Mother   . Cancer Mother   . Hypertension Mother   . Sarcoidosis Mother   . Stroke Father   . Hypertension Father   . Hyperlipidemia Father     ROS: Review of Systems  Constitutional: Positive for malaise/Jamie Barajas. Negative for weight loss.  Eyes:       + Vision changes  Respiratory: Positive for shortness of breath (with exertion).   Cardiovascular: Negative for chest pain, orthopnea and claudication.  Gastrointestinal: Negative for nausea and vomiting.  Genitourinary: Positive for frequency.  Musculoskeletal: Negative for myalgias.        Negative muscle weakness  Skin:       + Hair or nail changes    PHYSICAL EXAM: Blood pressure (!) 154/91, pulse 96, temperature 98.1 F (36.7 C), temperature source Oral, height 5\' 4"  (1.626 m), weight 186 lb (84.4 kg), last menstrual period 06/07/2019, SpO2 98 %. Body mass index Barajas 31.93 kg/m. Physical Exam Vitals signs reviewed.  Constitutional:      Appearance: Normal appearance. She Barajas obese.  HENT:     Head: Normocephalic and atraumatic.     Nose: Nose normal.  Eyes:     General: No scleral icterus.    Extraocular Movements: Extraocular movements intact.  Neck:     Musculoskeletal: Normal range of motion and neck supple.     Comments: No thyromegaly present Cardiovascular:     Rate and Rhythm: Normal rate and regular rhythm.     Pulses: Normal pulses.     Heart sounds: Normal heart sounds.  Pulmonary:     Effort: Pulmonary effort Barajas normal. No respiratory distress.     Breath sounds: Normal breath sounds.  Abdominal:     Palpations: Abdomen Barajas soft.     Tenderness: There Barajas no abdominal tenderness.     Comments: + Obesity  Musculoskeletal: Normal range of motion.     Right lower leg: No edema.     Left lower leg: No edema.  Skin:    General: Skin Barajas warm and dry.  Neurological:     Mental Status: She Barajas alert and oriented to person, place, and time.     Coordination: Coordination normal.  Psychiatric:        Mood and Affect: Mood normal.        Behavior: Behavior normal.     RECENT LABS AND TESTS: BMET    Component Value Date/Time   NA 140 07/03/2019 1200   K 4.2 07/03/2019 1200   CL 100 07/03/2019 1200   CO2 23 07/03/2019 1200   GLUCOSE 88 07/03/2019 1200   GLUCOSE 103 (H) 02/05/2012 1700   BUN 12 07/03/2019 1200   CREATININE 0.79 07/03/2019 1200   CALCIUM 9.8 07/03/2019 1200  GFRNONAA 88 07/03/2019 1200   GFRAA 102 07/03/2019 1200   Lab Results  Component Value Date   HGBA1C 5.5 07/03/2019   Lab Results  Component Value Date   INSULIN  12.2 07/03/2019   CBC    Component Value Date/Time   WBC 6.3 07/03/2019 1200   WBC 6.4 02/05/2012 1700   RBC 5.79 (H) 07/03/2019 1200   RBC 5.59 (H) 02/05/2012 1700   HGB 14.0 07/03/2019 1200   HCT 43.3 07/03/2019 1200   PLT 261 07/03/2019 1200   MCV 75 (L) 07/03/2019 1200   MCH 24.2 (L) 07/03/2019 1200   MCH 22.0 (L) 02/05/2012 1700   MCHC 32.3 07/03/2019 1200   MCHC 34.6 02/05/2012 1700   RDW 16.8 (H) 07/03/2019 1200   LYMPHSABS 1.0 07/03/2019 1200   MONOABS 0.6 02/05/2012 1700   EOSABS 0.0 07/03/2019 1200   BASOSABS 0.0 07/03/2019 1200   Iron/TIBC/Ferritin/ %Sat    Component Value Date/Time   IRON 72 07/03/2019 1200   TIBC 321 07/03/2019 1200   FERRITIN 36 07/03/2019 1200   IRONPCTSAT 22 07/03/2019 1200   Lipid Panel     Component Value Date/Time   CHOL 157 07/03/2019 1200   TRIG 120 07/03/2019 1200   HDL 32 (L) 07/03/2019 1200   CHOLHDL 4.3 12/16/2011 0535   VLDL 24 12/16/2011 0535   LDLCALC 103 (H) 07/03/2019 1200   Hepatic Function Panel     Component Value Date/Time   PROT 7.3 07/03/2019 1200   ALBUMIN 4.7 07/03/2019 1200   AST 20 07/03/2019 1200   ALT 12 07/03/2019 1200   ALKPHOS 73 07/03/2019 1200   BILITOT 0.3 07/03/2019 1200      Component Value Date/Time   TSH 0.607 07/03/2019 1200   TSH 0.009 (L) 12/28/2011 2216   TSH 0.010 (L) 12/15/2011 1544    ECG  shows NSR with a rate of 97 BPM INDIRECT CALORIMETER done today shows a VO2 of 230 and a REE of 1601.  Her calculated basal metabolic rate Barajas 0000000 thus her basal metabolic rate Barajas better than expected.       OBESITY BEHAVIORAL INTERVENTION VISIT  Today's visit was # 1   Starting weight: 186 lbs Starting date: 07/03/2019 Today's weight : 186 lbs Today's date: 07/03/2019 Total lbs lost to date: 0    ASK: We discussed the diagnosis of obesity with Mallie Snooks today and Breelyn agreed to give [place] permission to discuss obesity behavioral modification therapy  today.  ASSESS: Terrionna has the diagnosis of obesity and her BMI today Barajas 31.91 Erich Barajas in the action stage of change   ADVISE: Chantra was educated on the multiple health risks of obesity as well as the benefit of weight loss to improve her health. She was advised of the need for long term treatment and the importance of lifestyle modifications to improve her current health and to decrease her risk of future health problems.  AGREE: Multiple dietary modification options and treatment options were discussed and  Mikaella agreed to follow the recommendations documented in the above note.  ARRANGE: Panayiota was educated on the importance of frequent visits to treat obesity as outlined per CMS and USPSTF guidelines and agreed to schedule her next follow up appointment today.  Wilhemena Durie, am acting as transcriptionist for Briscoe Deutscher, DO  I have reviewed the above documentation for accuracy and completeness, and I agree with the above. Briscoe Deutscher, DO

## 2019-07-05 ENCOUNTER — Encounter (INDEPENDENT_AMBULATORY_CARE_PROVIDER_SITE_OTHER): Payer: Self-pay | Admitting: Family Medicine

## 2019-07-05 LAB — ANEMIA PANEL
Ferritin: 36 ng/mL (ref 15–150)
Folate, Hemolysate: 260 ng/mL
Folate, RBC: 600 ng/mL (ref >498)
Hematocrit: 43.3 % (ref 34.0–46.6)
Iron Saturation: 22 % (ref 15–55)
Iron: 72 ug/dL (ref 27–159)
Retic Ct Pct: 1.3 % (ref 0.6–2.6)
Total Iron Binding Capacity: 321 ug/dL (ref 250–450)
UIBC: 249 ug/dL (ref 131–425)
Vitamin B-12: 457 pg/mL (ref 232–1245)

## 2019-07-05 LAB — CBC WITH DIFFERENTIAL/PLATELET
Basophils Absolute: 0 10*3/uL (ref 0.0–0.2)
Basos: 1 %
EOS (ABSOLUTE): 0 10*3/uL (ref 0.0–0.4)
Eos: 0 %
Hemoglobin: 14 g/dL (ref 11.1–15.9)
Immature Grans (Abs): 0 10*3/uL (ref 0.0–0.1)
Immature Granulocytes: 0 %
Lymphocytes Absolute: 1 10*3/uL (ref 0.7–3.1)
Lymphs: 15 %
MCH: 24.2 pg — ABNORMAL LOW (ref 26.6–33.0)
MCHC: 32.3 g/dL (ref 31.5–35.7)
MCV: 75 fL — ABNORMAL LOW (ref 79–97)
Monocytes Absolute: 0.3 10*3/uL (ref 0.1–0.9)
Monocytes: 5 %
Neutrophils Absolute: 5 10*3/uL (ref 1.4–7.0)
Neutrophils: 79 %
Platelets: 261 10*3/uL (ref 150–450)
RBC: 5.79 x10E6/uL — ABNORMAL HIGH (ref 3.77–5.28)
RDW: 16.8 % — ABNORMAL HIGH (ref 11.7–15.4)
WBC: 6.3 10*3/uL (ref 3.4–10.8)

## 2019-07-05 LAB — COMPREHENSIVE METABOLIC PANEL
ALT: 12 IU/L (ref 0–32)
AST: 20 IU/L (ref 0–40)
Albumin/Globulin Ratio: 1.8 (ref 1.2–2.2)
Albumin: 4.7 g/dL (ref 3.8–4.8)
Alkaline Phosphatase: 73 IU/L (ref 39–117)
BUN/Creatinine Ratio: 15 (ref 9–23)
BUN: 12 mg/dL (ref 6–24)
Bilirubin Total: 0.3 mg/dL (ref 0.0–1.2)
CO2: 23 mmol/L (ref 20–29)
Calcium: 9.8 mg/dL (ref 8.7–10.2)
Chloride: 100 mmol/L (ref 96–106)
Creatinine, Ser: 0.79 mg/dL (ref 0.57–1.00)
GFR calc Af Amer: 102 mL/min/{1.73_m2} (ref >59)
GFR calc non Af Amer: 88 mL/min/{1.73_m2} (ref >59)
Globulin, Total: 2.6 g/dL (ref 1.5–4.5)
Glucose: 88 mg/dL (ref 65–99)
Potassium: 4.2 mmol/L (ref 3.5–5.2)
Sodium: 140 mmol/L (ref 134–144)
Total Protein: 7.3 g/dL (ref 6.0–8.5)

## 2019-07-05 LAB — LIPID PANEL WITH LDL/HDL RATIO
Cholesterol, Total: 157 mg/dL (ref 100–199)
HDL: 32 mg/dL — ABNORMAL LOW (ref >39)
LDL Chol Calc (NIH): 103 mg/dL — ABNORMAL HIGH (ref 0–99)
LDL/HDL Ratio: 3.2 ratio (ref 0.0–3.2)
Triglycerides: 120 mg/dL (ref 0–149)
VLDL Cholesterol Cal: 22 mg/dL (ref 5–40)

## 2019-07-05 LAB — INSULIN, RANDOM: INSULIN: 12.2 u[IU]/mL (ref 2.6–24.9)

## 2019-07-05 LAB — T3: T3, Total: 108 ng/dL (ref 71–180)

## 2019-07-05 LAB — HEMOGLOBIN A1C
Est. average glucose Bld gHb Est-mCnc: 111 mg/dL
Hgb A1c MFr Bld: 5.5 % (ref 4.8–5.6)

## 2019-07-05 LAB — VITAMIN D 25 HYDROXY (VIT D DEFICIENCY, FRACTURES): Vit D, 25-Hydroxy: 14 ng/mL — ABNORMAL LOW (ref 30.0–100.0)

## 2019-07-05 LAB — TSH: TSH: 0.607 u[IU]/mL (ref 0.450–4.500)

## 2019-07-05 LAB — T4, FREE: Free T4: 1 ng/dL (ref 0.82–1.77)

## 2019-07-10 ENCOUNTER — Encounter (INDEPENDENT_AMBULATORY_CARE_PROVIDER_SITE_OTHER): Payer: Self-pay | Admitting: Family Medicine

## 2019-07-10 NOTE — Telephone Encounter (Signed)
Please advise 

## 2019-07-17 ENCOUNTER — Encounter (INDEPENDENT_AMBULATORY_CARE_PROVIDER_SITE_OTHER): Payer: Self-pay | Admitting: Family Medicine

## 2019-07-17 ENCOUNTER — Ambulatory Visit (INDEPENDENT_AMBULATORY_CARE_PROVIDER_SITE_OTHER): Payer: BC Managed Care – PPO | Admitting: Family Medicine

## 2019-07-17 ENCOUNTER — Other Ambulatory Visit: Payer: Self-pay

## 2019-07-17 VITALS — BP 157/84 | HR 84 | Temp 98.0°F | Ht 64.0 in | Wt 180.0 lb

## 2019-07-17 DIAGNOSIS — E786 Lipoprotein deficiency: Secondary | ICD-10-CM | POA: Diagnosis not present

## 2019-07-17 DIAGNOSIS — E559 Vitamin D deficiency, unspecified: Secondary | ICD-10-CM

## 2019-07-17 DIAGNOSIS — Z9189 Other specified personal risk factors, not elsewhere classified: Secondary | ICD-10-CM

## 2019-07-17 DIAGNOSIS — I10 Essential (primary) hypertension: Secondary | ICD-10-CM | POA: Diagnosis not present

## 2019-07-17 DIAGNOSIS — Z6830 Body mass index (BMI) 30.0-30.9, adult: Secondary | ICD-10-CM

## 2019-07-17 DIAGNOSIS — E8881 Metabolic syndrome: Secondary | ICD-10-CM

## 2019-07-17 DIAGNOSIS — E669 Obesity, unspecified: Secondary | ICD-10-CM

## 2019-07-17 MED ORDER — VITAMIN D (ERGOCALCIFEROL) 1.25 MG (50000 UNIT) PO CAPS: 50000.0000 [IU] | ORAL_CAPSULE | ORAL | 0 refills | Status: DC

## 2019-07-18 ENCOUNTER — Encounter (INDEPENDENT_AMBULATORY_CARE_PROVIDER_SITE_OTHER): Payer: Self-pay | Admitting: Family Medicine

## 2019-07-18 NOTE — Progress Notes (Signed)
Office: [phone]  /  Fax: [phone]   HPI:   Chief Complaint: OBESITY Jamie Barajas is here to discuss her progress with her obesity treatment plan. She is on the Category 2 plan and is following her eating plan approximately 98 % of the time. She states she is exercising 0 minutes 0 times per week. Kinze did well over Thanksgiving. She used the plate method (not letting food touch). She is focused on protein.  Her weight is 180 lb (81.6 kg) today and has had a weight loss of 6 pounds over a period of 2 weeks since her last visit. She has lost 6 lbs since starting treatment with [place].  Insulin Resistance Kawther has a diagnosis of insulin resistance based on her elevated fasting insulin level >5. Although Cathern's blood glucose readings are still under good control, insulin resistance puts her at greater risk of metabolic syndrome and diabetes. She is not taking metformin currently and continues to work on diet and exercise to decrease risk of diabetes.  Vitamin D Deficiency Landri has a diagnosis of vitamin D deficiency. She is not currently taking Vit D. Last Vit D level was 14 on 07/03/2019. She denies nausea, vomiting or muscle weakness.  At risk for osteopenia and osteoporosis Sidne is at higher risk of osteopenia and osteoporosis due to vitamin D deficiency.   Low LDL Rodolfo's LDL is mildly elevated (dyslipidemia). This is likely genetic.  Hypertension Dezarey Hannigan is a 48 y.o. female with hypertension. Bana is taking Norvasc and metoprolol. She denies chest pain. She is working on weight loss to help control her blood pressure with the goal of decreasing her risk of heart attack and stroke.  ASSESSMENT AND PLAN:  Insulin resistance  Vitamin D deficiency - Plan: Vitamin D, Ergocalciferol, (DRISDOL) 1.25 MG (50000 UT) CAPS capsule  Essential hypertension  Low HDL (under 40)  At risk for osteoporosis  Class 1 obesity with serious comorbidity and body mass index (BMI) of  30.0 to 30.9 in adult, unspecified obesity type  PLAN:  Insulin Resistance Kaisa will continue to work on weight loss, exercise, and decreasing simple carbohydrates in her diet to help decrease the risk of diabetes. We dicussed metformin including benefits and risks. She was informed that eating too many simple carbohydrates or too many calories at one sitting increases the likelihood of GI side effects. Tyjae agrees to follow up with [place] as directed to monitor her progress.  Vitamin D Deficiency Corea was informed that low vitamin D levels contributes to fatigue and are associated with obesity, breast, and colon cancer. Mamie agrees to start prescription Vit D 50,000 IU every week #4 with no refills. She will follow up for routine testing of vitamin D, at least 2-3 times per year. She was informed of the risk of over-replacement of vitamin D and agrees to not increase her dose unless she discusses this with [place] first. Dina agrees to follow up with our clinic in 2 weeks.  At risk for osteopenia and osteoporosis Christyann was given extended (15 minutes) osteoporosis prevention counseling today. Young is at risk for osteopenia and osteoporsis due to her vitamin D deficiency. She was encouraged to take her vitamin D and follow her higher calcium diet and increase strengthening exercise to help strengthen her bones and decrease her risk of osteopenia and osteoporosis.  Low HDL We will continue to monitor.  Hypertension We discussed sodium restriction, working on healthy weight loss, and a regular exercise program as the means to achieve improved  blood pressure control. Jayli agreed with this plan and agreed to follow up as directed. We will continue to monitor her blood pressure as well as her progress with the above lifestyle modifications. Mykaylah agrees to continue her medications and will watch for signs of hypotension as she continues her lifestyle modifications. Donis agrees to follow up with our clinic  in 2 weeks.  Obesity Corda is currently in the action stage of change. As such, her goal is to continue with weight loss efforts She has agreed to follow the Category 2 plan Cleo has been instructed to work up to a goal of 150 minutes of combined cardio and strengthening exercise per week or as tolerated for weight loss and overall health benefits. We discussed the following Behavioral Modification Strategies today: increasing lean protein intake, decreasing simple carbohydrates, increase H20 intake, no skipping meals, increasing vegetables, holiday eating strategies, travel eating strategies   Robinn has agreed to follow up with our clinic in 2 weeks. She was informed of the importance of frequent follow up visits to maximize her success with intensive lifestyle modifications for her multiple health conditions.  ALLERGIES: Allergies  Allergen Reactions  . Azithromycin   . Protonix [Pantoprazole Sodium] Itching    MEDICATIONS: Current Outpatient Medications on File Prior to Visit  Medication Sig Dispense Refill  . amLODipine (NORVASC) 10 MG tablet Take 10 mg by mouth daily.    [name] LORazepam (ATIVAN) 0.5 MG tablet Take 0.5 mg by mouth daily as needed. For anxiety.    . metoprolol succinate (TOPROL-XL) 25 MG 24 hr tablet Take 25 mg by mouth daily.    [name] levalbuterol (XOPENEX HFA) 45 MCG/ACT inhaler Inhale 2 puffs into the lungs every 3 (three) hours as needed for wheezing. 1 Inhaler 1   No current facility-administered medications on file prior to visit.     PAST MEDICAL HISTORY: Past Medical History:  Diagnosis Date  . Abnormal EKG    Decreased anterior R wave progression, nonspecific ST-T wave changes  . Anemia   . Anxiety   . Atrial tachycardia ([place])    History of atrial tachycardia prior to 2013.  . Bilateral swelling of feet   . Chest pain    Nuclear, Dec 29, 2011, normal, EF 70%  . Fibroids   . Headache(784.0)   . Hypertension   . Hyperthyroidism    2013, Patient seen  extensively by endocrinology with treatment given. See hospital records  . Obesity   . Palpitations   . Prolonged QT interval    QT interval is normal when the T wave is clear  . PTSD (post-traumatic stress disorder)   . Shortness of breath    Echo, May, 2013, EF 65%, no valvular abnormalities.  . Shortness of breath   . Sickle cell trait ([place])   . SVT (supraventricular tachycardia) ([place])   . Uveitis   . Vitamin B 12 deficiency   . Vitamin D deficiency     PAST SURGICAL HISTORY: Past Surgical History:  Procedure Laterality Date  . IR RADIOLOGIST EVAL & MGMT  03/24/2017  . KNEE ARTHROSCOPY W/ INTERNAL FIXATION TIBIAL SPINE FRACTURE  ~ 2011   Rght    SOCIAL HISTORY: Social History   Tobacco Use  . Smoking status: Never Smoker  . Smokeless tobacco: Never Used  Substance Use Topics  . Alcohol use: No    Comment: 12/15/11 "glass of wine maybe 3 times/month"  . Drug use: No    FAMILY HISTORY: Family History  Problem Relation  Age of Onset  . Stroke Mother   . Cancer Mother   . Hypertension Mother   . Sarcoidosis Mother   . Stroke Father   . Hypertension Father   . Hyperlipidemia Father     ROS: Review of Systems  Constitutional: Positive for weight loss.  Cardiovascular: Negative for chest pain.  Gastrointestinal: Negative for nausea and vomiting.  Musculoskeletal:       Negative muscle weakness    PHYSICAL EXAM: Blood pressure (!) 157/84, pulse 84, temperature 98 F (36.7 C), temperature source Oral, height 5\' 4"  (1.626 m), weight 180 lb (81.6 kg), SpO2 99 %. Body mass index is 30.9 kg/m. Physical Exam Vitals signs reviewed.  Constitutional:      Appearance: Normal appearance. She is obese.  Cardiovascular:     Rate and Rhythm: Normal rate.     Pulses: Normal pulses.  Pulmonary:     Effort: Pulmonary effort is normal.     Breath sounds: Normal breath sounds.  Musculoskeletal: Normal range of motion.  Skin:    General: Skin is warm and dry.    Neurological:     Mental Status: She is alert and oriented to person, place, and time.  Psychiatric:        Mood and Affect: Mood normal.        Behavior: Behavior normal.     RECENT LABS AND TESTS: BMET    Component Value Date/Time   NA 140 07/03/2019 1200   K 4.2 07/03/2019 1200   CL 100 07/03/2019 1200   CO2 23 07/03/2019 1200   GLUCOSE 88 07/03/2019 1200   GLUCOSE 103 (H) 02/05/2012 1700   BUN 12 07/03/2019 1200   CREATININE 0.79 07/03/2019 1200   CALCIUM 9.8 07/03/2019 1200   GFRNONAA 88 07/03/2019 1200   GFRAA 102 07/03/2019 1200   Lab Results  Component Value Date   HGBA1C 5.5 07/03/2019   Lab Results  Component Value Date   INSULIN 12.2 07/03/2019   CBC    Component Value Date/Time   WBC 6.3 07/03/2019 1200   WBC 6.4 02/05/2012 1700   RBC 5.79 (H) 07/03/2019 1200   RBC 5.59 (H) 02/05/2012 1700   HGB 14.0 07/03/2019 1200   HCT 43.3 07/03/2019 1200   PLT 261 07/03/2019 1200   MCV 75 (L) 07/03/2019 1200   MCH 24.2 (L) 07/03/2019 1200   MCH 22.0 (L) 02/05/2012 1700   MCHC 32.3 07/03/2019 1200   MCHC 34.6 02/05/2012 1700   RDW 16.8 (H) 07/03/2019 1200   LYMPHSABS 1.0 07/03/2019 1200   MONOABS 0.6 02/05/2012 1700   EOSABS 0.0 07/03/2019 1200   BASOSABS 0.0 07/03/2019 1200   Iron/TIBC/Ferritin/ %Sat    Component Value Date/Time   IRON 72 07/03/2019 1200   TIBC 321 07/03/2019 1200   FERRITIN 36 07/03/2019 1200   IRONPCTSAT 22 07/03/2019 1200   Lipid Panel     Component Value Date/Time   CHOL 157 07/03/2019 1200   TRIG 120 07/03/2019 1200   HDL 32 (L) 07/03/2019 1200   CHOLHDL 4.3 12/16/2011 0535   VLDL 24 12/16/2011 0535   LDLCALC 103 (H) 07/03/2019 1200   Hepatic Function Panel     Component Value Date/Time   PROT 7.3 07/03/2019 1200   ALBUMIN 4.7 07/03/2019 1200   AST 20 07/03/2019 1200   ALT 12 07/03/2019 1200   ALKPHOS 73 07/03/2019 1200   BILITOT 0.3 07/03/2019 1200      Component Value Date/Time   TSH 0.607 07/03/2019 1200  TSH 0.009 (L) 12/28/2011 2216   TSH 0.010 (L) 12/15/2011 1544      OBESITY BEHAVIORAL INTERVENTION VISIT  Today's visit was # 2   Starting weight: 186 lbs Starting date: 07/03/2019 Today's weight : 180 lbs  Today's date: 07/17/2019 Total lbs lost to date: 6    ASK: We discussed the diagnosis of obesity with Mallie Snooks today and Sheretta agreed to give [place] permission to discuss obesity behavioral modification therapy today.  ASSESS: Jaynee has the diagnosis of obesity and her BMI today is 30.88 Ashyah is in the action stage of change   ADVISE: Ryne was educated on the multiple health risks of obesity as well as the benefit of weight loss to improve her health. She was advised of the need for long term treatment and the importance of lifestyle modifications to improve her current health and to decrease her risk of future health problems.  AGREE: Multiple dietary modification options and treatment options were discussed and  Darnice agreed to follow the recommendations documented in the above note.  ARRANGE: Lourine was educated on the importance of frequent visits to treat obesity as outlined per CMS and USPSTF guidelines and agreed to schedule her next follow up appointment today.  Wilhemena Durie, am acting as transcriptionist for Wendie Simmer, DO  I have reviewed the above documentation for accuracy and completeness, and I agree with the above. Briscoe Deutscher, DO

## 2019-07-18 NOTE — Telephone Encounter (Signed)
Please advise 

## 2019-07-31 ENCOUNTER — Other Ambulatory Visit: Payer: Self-pay

## 2019-07-31 ENCOUNTER — Ambulatory Visit (INDEPENDENT_AMBULATORY_CARE_PROVIDER_SITE_OTHER): Payer: BC Managed Care – PPO | Admitting: Family Medicine

## 2019-07-31 ENCOUNTER — Encounter (INDEPENDENT_AMBULATORY_CARE_PROVIDER_SITE_OTHER): Payer: Self-pay | Admitting: Family Medicine

## 2019-07-31 VITALS — BP 143/95 | HR 83 | Temp 98.4°F | Ht 64.0 in | Wt 177.0 lb

## 2019-07-31 DIAGNOSIS — E559 Vitamin D deficiency, unspecified: Secondary | ICD-10-CM | POA: Diagnosis not present

## 2019-07-31 DIAGNOSIS — Z6830 Body mass index (BMI) 30.0-30.9, adult: Secondary | ICD-10-CM

## 2019-07-31 DIAGNOSIS — E8881 Metabolic syndrome: Secondary | ICD-10-CM

## 2019-07-31 DIAGNOSIS — Z9189 Other specified personal risk factors, not elsewhere classified: Secondary | ICD-10-CM | POA: Diagnosis not present

## 2019-07-31 DIAGNOSIS — I10 Essential (primary) hypertension: Secondary | ICD-10-CM | POA: Diagnosis not present

## 2019-07-31 DIAGNOSIS — E669 Obesity, unspecified: Secondary | ICD-10-CM

## 2019-08-01 ENCOUNTER — Other Ambulatory Visit (INDEPENDENT_AMBULATORY_CARE_PROVIDER_SITE_OTHER): Payer: Self-pay | Admitting: Family Medicine

## 2019-08-01 DIAGNOSIS — E559 Vitamin D deficiency, unspecified: Secondary | ICD-10-CM

## 2019-08-01 NOTE — Telephone Encounter (Signed)
Do you have a pool you want me to send refills for you to?

## 2019-08-02 MED ORDER — VITAMIN D (ERGOCALCIFEROL) 1.25 MG (50000 UNIT) PO CAPS: 50000.0000 [IU] | ORAL_CAPSULE | ORAL | 0 refills | Status: DC

## 2019-08-03 NOTE — Progress Notes (Signed)
Office: [phone]  /  Fax: [phone]   HPI:  Chief Complaint: OBESITY Jamie Barajas is here to discuss her progress with her obesity treatment plan. She is on the follow the Category 2 plan and states she is following her eating plan approximately 90 % of the time. She states she is exercising 0 minutes 0 times per week.  [name] is doing well. She likes the breakfast options. She tried [employer] and liked them. She went to [employer] and had grilled nuggets, kale salad, and diet lemonade.   Today's visit was # 3  Starting weight: 186 lbs Starting date: 07/03/19 Today's weight : Weight: 177 lb (80.3 kg)  Today's date: 07/31/19 Total lbs lost to date: 9 lbs Total lbs lost since last in-office visit: 3 lbs   Insulin Resistance [name] has insulin resistance. She denies polyphagia unless she eats a microwave meal.   At risk for diabetes [name] is at higher than average risk for developing diabetes due to her obesity.   Vitamin D Deficiency [name] has a diagnosis of Vit D deficiency. Her last vit D level on 07/03/19 was 14.   Hypertension  [name] has a diagnosis of hypertension. She is currently taking Norvasc and Metoprolol. Her blood pressure is elevated today but no symptoms of chest pain or edema.   ASSESSMENT AND PLAN:  Insulin resistance  Vitamin D deficiency - Plan: Vitamin D, Ergocalciferol, (DRISDOL) 1.25 MG (50000 UT) CAPS capsule  Essential hypertension  At risk for diabetes mellitus  Class 1 obesity with serious comorbidity and body mass index (BMI) of 30.0 to 30.9 in adult, unspecified obesity type  PLAN: Insulin Resistance Mikita will continue to work on weight loss, exercise, and decreasing simple carbohydrates to help decrease the risk of diabetes. Teresha agreed to follow up with [place] as directed to closely monitor her progress.  Diabetes risk counseling (~15 min) [name] is a 48 y.o. female and has risk factors for diabetes including obesity. We discussed  intensive lifestyle modifications today with an emphasis on weight loss as well as increasing exercise and decreasing simple carbohydrates in her diet.  Vitamin D Deficiency Low vitamin D level contributes to fatigue and are associated with obesity, breast, and colon cancer. She agrees to continue to take prescription Vit D @50 ,000 IU every week #4 with no refill sent in today and will follow up for routine testing of vitamin D, at least 2-3 times per year to avoid over-replacement.  Hypertension [name] is working on healthy weight loss and exercise to improve blood pressure control. We discussed watching salt intake and increasing water intake. We will watch for signs of hypotension as she continues her lifestyle modifications.  Obesity [name] is currently in the action stage of change. As such, her goal is to continue with weight loss efforts. She has agreed to follow the Category 2 plan.  [name] is ready to start exercising and has been instructed to work up to a goal of 150 minutes of combined cardio and strengthening exercise per week for weight loss and overall health benefits.  We discussed the following Behavioral Modification Strategies today: increasing water intake, increasing vegetables and work on meal planning and easy cooking plans  [name] has agreed to follow up with our clinic in 2 weeks. She was informed of the importance of frequent follow up visits to maximize her success with intensive lifestyle modifications for her multiple health conditions.  ALLERGIES: Allergies  Allergen Reactions  . Azithromycin   . Protonix [Pantoprazole  Sodium] Itching   MEDICATIONS: Current Outpatient Medications on File Prior to Visit  Medication Sig Dispense Refill  . amLODipine (NORVASC) 10 MG tablet Take 10 mg by mouth daily.    [name] LORazepam (ATIVAN) 0.5 MG tablet Take 0.5 mg by mouth daily as needed. For anxiety.    . metoprolol succinate (TOPROL-XL) 25 MG 24 hr tablet Take 25 mg by mouth  daily.    [name] levalbuterol (XOPENEX HFA) 45 MCG/ACT inhaler Inhale 2 puffs into the lungs every 3 (three) hours as needed for wheezing. 1 Inhaler 1   No current facility-administered medications on file prior to visit.   PAST MEDICAL HISTORY: Past Medical History:  Diagnosis Date  . Abnormal EKG    Decreased anterior R wave progression, nonspecific ST-T wave changes  . Anemia   . Anxiety   . Atrial tachycardia ([place])    History of atrial tachycardia prior to 2013.  . Bilateral swelling of feet   . Chest pain    Nuclear, Dec 29, 2011, normal, EF 70%  . Fibroids   . Hypertension   . Hyperthyroidism    2013, Patient seen extensively by endocrinology with treatment given. See hospital records  . Prolonged QT interval    QT interval is normal when the T wave is clear  . PTSD (post-traumatic stress disorder)   . Shortness of breath    Echo, May, 2013, EF 65%, no valvular abnormalities.  . Sickle cell trait ([place])   . SVT (supraventricular tachycardia) ([place])   . Uveitis   . Vitamin B 12 deficiency   . Vitamin D deficiency    PAST SURGICAL HISTORY: Past Surgical History:  Procedure Laterality Date  . IR RADIOLOGIST EVAL & MGMT  03/24/2017  . KNEE ARTHROSCOPY W/ INTERNAL FIXATION TIBIAL SPINE FRACTURE Right ~ 2011   SOCIAL HISTORY: Social History   Tobacco Use  . Smoking status: Never Smoker  . Smokeless tobacco: Never Used  Substance Use Topics  . Alcohol use: No    Comment: 12/15/11 "glass of wine maybe 3 times/month"  . Drug use: No   FAMILY HISTORY: Family History  Problem Relation Age of Onset  . Stroke Mother   . Cancer Mother   . Hypertension Mother   . Sarcoidosis Mother   . Stroke Father   . Hypertension Father   . Hyperlipidemia Father    ROS: Review of Systems  Constitutional: Positive for weight loss.  Cardiovascular: Negative for chest pain and leg swelling.   PHYSICAL EXAM: Blood pressure (!) 143/95, pulse 83, temperature 98.4 F (36.9 C), temperature  source Oral, height 5\' 4"  (1.626 m), weight 177 lb (80.3 kg), SpO2 100 %. Body mass index is 30.38 kg/m.   Physical Exam Vitals reviewed.  Constitutional:      Appearance: Normal appearance.  HENT:     Head: Normocephalic.  Cardiovascular:     Rate and Rhythm: Normal rate.  Pulmonary:     Effort: Pulmonary effort is normal.  Musculoskeletal:        General: Normal range of motion.  Skin:    General: Skin is warm and dry.  Neurological:     Mental Status: She is alert and oriented to person, place, and time.  Psychiatric:        Mood and Affect: Mood normal.        Behavior: Behavior normal.    RECENT LABS AND TESTS: BMET    Component Value Date/Time   NA 140 07/03/2019 1200   K 4.2  07/03/2019 1200   CL 100 07/03/2019 1200   CO2 23 07/03/2019 1200   GLUCOSE 88 07/03/2019 1200   GLUCOSE 103 (H) 02/05/2012 1700   BUN 12 07/03/2019 1200   CREATININE 0.79 07/03/2019 1200   CALCIUM 9.8 07/03/2019 1200   GFRNONAA 88 07/03/2019 1200   GFRAA 102 07/03/2019 1200   Lab Results  Component Value Date   HGBA1C 5.5 07/03/2019   Lab Results  Component Value Date   INSULIN 12.2 07/03/2019   CBC    Component Value Date/Time   WBC 6.3 07/03/2019 1200   WBC 6.4 02/05/2012 1700   RBC 5.79 (H) 07/03/2019 1200   RBC 5.59 (H) 02/05/2012 1700   HGB 14.0 07/03/2019 1200   HCT 43.3 07/03/2019 1200   PLT 261 07/03/2019 1200   MCV 75 (L) 07/03/2019 1200   MCH 24.2 (L) 07/03/2019 1200   MCH 22.0 (L) 02/05/2012 1700   MCHC 32.3 07/03/2019 1200   MCHC 34.6 02/05/2012 1700   RDW 16.8 (H) 07/03/2019 1200   LYMPHSABS 1.0 07/03/2019 1200   MONOABS 0.6 02/05/2012 1700   EOSABS 0.0 07/03/2019 1200   BASOSABS 0.0 07/03/2019 1200   Iron/TIBC/Ferritin/ %Sat    Component Value Date/Time   IRON 72 07/03/2019 1200   TIBC 321 07/03/2019 1200   FERRITIN 36 07/03/2019 1200   IRONPCTSAT 22 07/03/2019 1200   Lipid Panel     Component Value Date/Time   CHOL 157 07/03/2019 1200   TRIG  120 07/03/2019 1200   HDL 32 (L) 07/03/2019 1200   CHOLHDL 4.3 12/16/2011 0535   VLDL 24 12/16/2011 0535   LDLCALC 103 (H) 07/03/2019 1200   Hepatic Function Panel     Component Value Date/Time   PROT 7.3 07/03/2019 1200   ALBUMIN 4.7 07/03/2019 1200   AST 20 07/03/2019 1200   ALT 12 07/03/2019 1200   ALKPHOS 73 07/03/2019 1200   BILITOT 0.3 07/03/2019 1200      Component Value Date/Time   TSH 0.607 07/03/2019 1200   TSH 0.009 (L) 12/28/2011 2216   TSH 0.010 (L) 12/15/2011 1544   I, Renee Ramus, am acting as Location manager for [employer], DO   I have reviewed the above documentation for accuracy and completeness, and I agree with the above. Briscoe Deutscher, DO

## 2019-08-04 ENCOUNTER — Encounter (INDEPENDENT_AMBULATORY_CARE_PROVIDER_SITE_OTHER): Payer: Self-pay | Admitting: Family Medicine

## 2019-08-21 ENCOUNTER — Ambulatory Visit (INDEPENDENT_AMBULATORY_CARE_PROVIDER_SITE_OTHER): Payer: BC Managed Care – PPO | Admitting: Family Medicine

## 2019-08-31 ENCOUNTER — Other Ambulatory Visit: Payer: Self-pay

## 2019-08-31 ENCOUNTER — Ambulatory Visit (INDEPENDENT_AMBULATORY_CARE_PROVIDER_SITE_OTHER): Payer: BC Managed Care – PPO | Admitting: Family Medicine

## 2019-08-31 ENCOUNTER — Encounter (INDEPENDENT_AMBULATORY_CARE_PROVIDER_SITE_OTHER): Payer: Self-pay | Admitting: Family Medicine

## 2019-08-31 VITALS — BP 130/90 | HR 68 | Temp 98.1°F | Ht 64.0 in | Wt 171.0 lb

## 2019-08-31 DIAGNOSIS — Z6830 Body mass index (BMI) 30.0-30.9, adult: Secondary | ICD-10-CM

## 2019-08-31 DIAGNOSIS — E559 Vitamin D deficiency, unspecified: Secondary | ICD-10-CM | POA: Diagnosis not present

## 2019-08-31 DIAGNOSIS — Z9189 Other specified personal risk factors, not elsewhere classified: Secondary | ICD-10-CM

## 2019-08-31 DIAGNOSIS — E88819 Insulin resistance, unspecified: Secondary | ICD-10-CM

## 2019-08-31 DIAGNOSIS — E8881 Metabolic syndrome: Secondary | ICD-10-CM

## 2019-08-31 DIAGNOSIS — E669 Obesity, unspecified: Secondary | ICD-10-CM

## 2019-08-31 DIAGNOSIS — I10 Essential (primary) hypertension: Secondary | ICD-10-CM | POA: Diagnosis not present

## 2019-08-31 MED ORDER — VITAMIN D (ERGOCALCIFEROL) 1.25 MG (50000 UNIT) PO CAPS: 50000.0000 [IU] | ORAL_CAPSULE | ORAL | 0 refills | Status: DC

## 2019-09-04 ENCOUNTER — Encounter (INDEPENDENT_AMBULATORY_CARE_PROVIDER_SITE_OTHER): Payer: Self-pay | Admitting: Family Medicine

## 2019-09-04 NOTE — Progress Notes (Signed)
Chief Complaint:   OBESITY Jamie Barajas is here to discuss her progress with her obesity treatment plan along with follow-up of her obesity related diagnoses. Jamie Barajas is on the Category 2 Plan and states she is following her eating plan approximately 100% of the time. Jamie Barajas states she is walking for 40 minutes 1 time per week.  Today's visit was #: 4 Starting weight: 186 lbs Starting date: 07/03/2019 Today's weight: 171 lbs Today's date: 08/31/2019 Total lbs lost to date: 15 Total lbs lost since last in-office visit: 6  Interim History: Jamie Barajas is doing well on her diet. She was off just before Christmas. She lost her sister to septic shock (infection in previous hip surgery site).  Subjective:   1. Essential hypertension Jamie Barajas is on Norvasc and metoprolol. Her blood pressure is 130/90 today, but not at goal. Cardiovascular ROS: negative.  BP Readings from Last 3 Encounters:  08/31/19 130/90  07/31/19 (!) 143/95  07/17/19 (!) 157/84   Lab Results  Component Value Date   CREATININE 0.79 07/03/2019   CREATININE 0.80 02/05/2012   CREATININE 0.69 12/29/2011   2. Vitamin D deficiency Jamie Barajas Vitamin D level was 14 on 07/03/2019. She is currently taking vit D. She denies nausea, vomiting or muscle weakness.  3. Insulin resistance Jamie Barajas has a diagnosis of insulin resistance based on her elevated fasting insulin level >5. She continues to work on diet and exercise to decrease her risk of diabetes. She denies polyphagia.  4. At risk for heart disease Jamie Barajas is at a higher than average risk for cardiovascular disease due to obesity. Reviewed: no chest pain on exertion, no dyspnea on exertion, and no swelling of ankles.  Assessment/Plan:   1. Essential hypertension Jamie Barajas is working on healthy weight loss and exercise to improve blood pressure control. We will watch for signs of hypotension as she continues her lifestyle modifications. We will continue to monitor.  2. Vitamin D  deficiency Low Vitamin D level contributes to fatigue and are associated with obesity, breast, and colon cancer. We will refill prescription Vit D for 1 month. She will follow-up for routine testing of Vitamin D, at least 2-3 times per year to avoid over-replacement. We will continue to monitor.  - Vitamin D, Ergocalciferol, (DRISDOL) 1.25 MG (50000 UNIT) CAPS capsule; Take 1 capsule (50,000 Units total) by mouth every 7 (seven) days.  Dispense: 4 capsule; Refill: 0  3. Insulin resistance Jamie Barajas will continue to work on weight loss, exercise, and decreasing simple carbohydrates to help decrease the risk of diabetes. Jamie Barajas agreed to follow-up with [place] as directed to closely monitor her progress.  4. At risk for heart disease Jamie Barajas was given approximately 15 minutes of coronary artery disease prevention counseling today. She is 49 y.o. female and has risk factors for heart disease including obesity. We discussed intensive lifestyle modifications today with an emphasis on specific weight loss instructions and strategies.   5. Class 1 obesity with serious comorbidity and body mass index (BMI) of 30.0 to 30.9 in adult, unspecified obesity type Jamie Barajas is currently in the action stage of change. As such, her goal is to continue with weight loss efforts. She has agreed to on the Category 2 Plan.   Exercise goals: Jamie Barajas is to do HIIT walking for 20 minutes 3 days per week, and strengthening 1-2 days per week.  Behavioral modification strategies: increasing lean protein intake.  Jamie Barajas has agreed to follow-up with our clinic in 2 weeks. She was informed of the importance  of frequent follow-up visits to maximize her success with intensive lifestyle modifications for her multiple health conditions.   Objective:   Blood pressure 130/90, pulse 68, temperature 98.1 F (36.7 C), temperature source Oral, height 5\' 4"  (1.626 m), weight 171 lb (77.6 kg), last menstrual period 08/30/2019, SpO2 98 %. Body mass index  is 29.35 kg/m.  General: Cooperative, alert, well developed, in no acute distress. HEENT: Conjunctivae and lids unremarkable. Cardiovascular: Regular rhythm.  Lungs: Normal work of breathing. Neurologic: No focal deficits.   Lab Results  Component Value Date   CREATININE 0.79 07/03/2019   BUN 12 07/03/2019   NA 140 07/03/2019   K 4.2 07/03/2019   CL 100 07/03/2019   CO2 23 07/03/2019   Lab Results  Component Value Date   ALT 12 07/03/2019   AST 20 07/03/2019   ALKPHOS 73 07/03/2019   BILITOT 0.3 07/03/2019   Lab Results  Component Value Date   HGBA1C 5.5 07/03/2019   Lab Results  Component Value Date   INSULIN 12.2 07/03/2019   Lab Results  Component Value Date   TSH 0.607 07/03/2019   Lab Results  Component Value Date   CHOL 157 07/03/2019   HDL 32 (L) 07/03/2019   LDLCALC 103 (H) 07/03/2019   TRIG 120 07/03/2019   CHOLHDL 4.3 12/16/2011   Lab Results  Component Value Date   WBC 6.3 07/03/2019   HGB 14.0 07/03/2019   HCT 43.3 07/03/2019   MCV 75 (L) 07/03/2019   PLT 261 07/03/2019   Lab Results  Component Value Date   IRON 72 07/03/2019   TIBC 321 07/03/2019   FERRITIN 36 07/03/2019   Attestation Statements:   Reviewed by clinician on day of visit: allergies, medications, problem list, medical history, surgical history, family history, social history, and previous encounter notes.   Wilhemena Durie, am acting as transcriptionist for [employer], DO.  I have reviewed the above documentation for accuracy and completeness, and I agree with the above. Briscoe Deutscher, DO

## 2019-09-13 ENCOUNTER — Encounter (INDEPENDENT_AMBULATORY_CARE_PROVIDER_SITE_OTHER): Payer: Self-pay | Admitting: Family Medicine

## 2019-09-13 ENCOUNTER — Other Ambulatory Visit: Payer: Self-pay

## 2019-09-13 ENCOUNTER — Ambulatory Visit (INDEPENDENT_AMBULATORY_CARE_PROVIDER_SITE_OTHER): Payer: BC Managed Care – PPO | Admitting: Family Medicine

## 2019-09-13 VITALS — BP 127/78 | HR 67 | Temp 98.3°F | Ht 64.0 in | Wt 171.0 lb

## 2019-09-13 DIAGNOSIS — I10 Essential (primary) hypertension: Secondary | ICD-10-CM | POA: Diagnosis not present

## 2019-09-13 DIAGNOSIS — E8881 Metabolic syndrome: Secondary | ICD-10-CM | POA: Diagnosis not present

## 2019-09-13 DIAGNOSIS — E559 Vitamin D deficiency, unspecified: Secondary | ICD-10-CM | POA: Diagnosis not present

## 2019-09-13 DIAGNOSIS — E669 Obesity, unspecified: Secondary | ICD-10-CM

## 2019-09-13 DIAGNOSIS — Z6830 Body mass index (BMI) 30.0-30.9, adult: Secondary | ICD-10-CM

## 2019-09-13 MED ORDER — VITAMIN D (ERGOCALCIFEROL) 1.25 MG (50000 UNIT) PO CAPS: 50000.0000 [IU] | ORAL_CAPSULE | ORAL | 0 refills | Status: DC

## 2019-09-14 ENCOUNTER — Encounter (INDEPENDENT_AMBULATORY_CARE_PROVIDER_SITE_OTHER): Payer: Self-pay | Admitting: Family Medicine

## 2019-09-14 NOTE — Telephone Encounter (Signed)
Please advise 

## 2019-09-14 NOTE — Progress Notes (Signed)
Chief Complaint:   OBESITY Jamie Barajas is here to discuss her progress with her obesity treatment plan along with follow-up of her obesity related diagnoses. Jamie Barajas is on the Category 2 Plan and states she is following her eating plan approximately 95% of the time. Jamie Barajas states she is exercising for 0 minutes 0 times per week.  Today's visit was #: 5 Starting weight: 186 lbs Starting date: 07/03/2019 Today's weight: 171 lbs Today's date: 09/13/2019 Total lbs lost to date: 15 lbs Total lbs lost since last in-office visit: 0  Interim History: Jamie Barajas recently celebrated her 49th birthday.  She enjoyed two pieces of coconut cake.  Subjective:   1. Insulin resistance Jamie Barajas has a diagnosis of insulin resistance based on her elevated fasting insulin level >5. She continues to work on diet and exercise to decrease her risk of diabetes.  Lab Results  Component Value Date   INSULIN 12.2 07/03/2019   Lab Results  Component Value Date   HGBA1C 5.5 07/03/2019   2. Vitamin D deficiency Jamie Barajas's Vitamin D level was 14.0 on 07/03/2019. She is currently taking vit D. She denies nausea, vomiting or muscle weakness.  3. Essential hypertension Review: taking medications as instructed, no medication side effects noted, no chest pain on exertion, no dyspnea on exertion, no swelling of ankles.  She is taking Norvasc and metoprolol for blood pressure control.  BP Readings from Last 3 Encounters:  09/13/19 127/78  08/31/19 130/90  07/31/19 (!) 143/95   Assessment/Plan:   1. Insulin resistance Jamie Barajas will continue to work on weight loss, exercise, and decreasing simple carbohydrates to help decrease the risk of diabetes. Jamie Barajas agreed to follow-up with [place] as directed to closely monitor her progress.  2. Vitamin D deficiency Low Vitamin D level contributes to fatigue and are associated with obesity, breast, and colon cancer. She agrees to continue to take prescription Vitamin D @50 ,000 IU every week  and will follow-up for routine testing of Vitamin D, at least 2-3 times per year to avoid over-replacement.  Orders - Vitamin D, Ergocalciferol, (DRISDOL) 1.25 MG (50000 UNIT) CAPS capsule; Take 1 capsule (50,000 Units total) by mouth every 7 (seven) days.  Dispense: 4 capsule; Refill: 0  3. Essential hypertension Jamie Barajas is working on healthy weight loss and exercise to improve blood pressure control. We will watch for signs of hypotension as she continues her lifestyle modifications.  4. Class 1 obesity with serious comorbidity and body mass index (BMI) of 30.0 to 30.9 in adult, unspecified obesity type Jamie Barajas is currently in the action stage of change. As such, her goal is to continue with weight loss efforts. She has agreed to the Category 2 Plan.   Exercise goals: For substantial health benefits, adults should do at least 150 minutes (2 hours and 30 minutes) a week of moderate-intensity, or 75 minutes (1 hour and 15 minutes) a week of vigorous-intensity aerobic physical activity, or an equivalent combination of moderate- and vigorous-intensity aerobic activity. Aerobic activity should be performed in episodes of at least 10 minutes, and preferably, it should be spread throughout the week. Adults should also include muscle-strengthening activities that involve all major muscle groups on 2 or more days a week.  Behavioral modification strategies: emotional eating strategies.  Jamie Barajas has agreed to follow-up with our clinic in 2 weeks. She was informed of the importance of frequent follow-up visits to maximize her success with intensive lifestyle modifications for her multiple health conditions.   Objective:   Blood pressure 127/78,  pulse 67, temperature 98.3 F (36.8 C), temperature source Oral, height 5\' 4"  (1.626 m), weight 171 lb (77.6 kg), last menstrual period 08/30/2019, SpO2 98 %. Body mass index is 29.35 kg/m.  General: Cooperative, alert, well developed, in no acute distress. HEENT:  Conjunctivae and lids unremarkable. Cardiovascular: Regular rhythm.  Lungs: Normal work of breathing. Neurologic: No focal deficits.   Lab Results  Component Value Date   CREATININE 0.79 07/03/2019   BUN 12 07/03/2019   NA 140 07/03/2019   K 4.2 07/03/2019   CL 100 07/03/2019   CO2 23 07/03/2019   Lab Results  Component Value Date   ALT 12 07/03/2019   AST 20 07/03/2019   ALKPHOS 73 07/03/2019   BILITOT 0.3 07/03/2019   Lab Results  Component Value Date   HGBA1C 5.5 07/03/2019   Lab Results  Component Value Date   INSULIN 12.2 07/03/2019   Lab Results  Component Value Date   TSH 0.607 07/03/2019   Lab Results  Component Value Date   CHOL 157 07/03/2019   HDL 32 (L) 07/03/2019   LDLCALC 103 (H) 07/03/2019   TRIG 120 07/03/2019   CHOLHDL 4.3 12/16/2011   Lab Results  Component Value Date   WBC 6.3 07/03/2019   HGB 14.0 07/03/2019   HCT 43.3 07/03/2019   MCV 75 (L) 07/03/2019   PLT 261 07/03/2019   Lab Results  Component Value Date   IRON 72 07/03/2019   TIBC 321 07/03/2019   FERRITIN 36 07/03/2019   Attestation Statements:   Reviewed by clinician on day of visit: allergies, medications, problem list, medical history, surgical history, family history, social history, and previous encounter notes.  I, Water quality scientist, CMA, am acting as Location manager for [employer], DO.  I have reviewed the above documentation for accuracy and completeness, and I agree with the above. Briscoe Deutscher, DO

## 2019-10-02 ENCOUNTER — Other Ambulatory Visit: Payer: Self-pay

## 2019-10-02 ENCOUNTER — Ambulatory Visit (INDEPENDENT_AMBULATORY_CARE_PROVIDER_SITE_OTHER): Payer: BC Managed Care – PPO | Admitting: Family Medicine

## 2019-10-02 ENCOUNTER — Encounter (INDEPENDENT_AMBULATORY_CARE_PROVIDER_SITE_OTHER): Payer: Self-pay | Admitting: Family Medicine

## 2019-10-02 VITALS — BP 124/82 | HR 79 | Temp 98.6°F | Ht 64.0 in | Wt 166.0 lb

## 2019-10-02 DIAGNOSIS — Z9189 Other specified personal risk factors, not elsewhere classified: Secondary | ICD-10-CM | POA: Diagnosis not present

## 2019-10-02 DIAGNOSIS — E559 Vitamin D deficiency, unspecified: Secondary | ICD-10-CM

## 2019-10-02 DIAGNOSIS — I10 Essential (primary) hypertension: Secondary | ICD-10-CM | POA: Diagnosis not present

## 2019-10-02 DIAGNOSIS — Z6830 Body mass index (BMI) 30.0-30.9, adult: Secondary | ICD-10-CM

## 2019-10-02 DIAGNOSIS — E8881 Metabolic syndrome: Secondary | ICD-10-CM | POA: Diagnosis not present

## 2019-10-02 DIAGNOSIS — E669 Obesity, unspecified: Secondary | ICD-10-CM

## 2019-10-02 DIAGNOSIS — E88819 Insulin resistance, unspecified: Secondary | ICD-10-CM

## 2019-10-02 MED ORDER — VITAMIN D (ERGOCALCIFEROL) 1.25 MG (50000 UNIT) PO CAPS: 50000.0000 [IU] | ORAL_CAPSULE | ORAL | 0 refills | Status: DC

## 2019-10-03 ENCOUNTER — Encounter (INDEPENDENT_AMBULATORY_CARE_PROVIDER_SITE_OTHER): Payer: Self-pay | Admitting: Family Medicine

## 2019-10-03 NOTE — Progress Notes (Signed)
Chief Complaint:   OBESITY Jamie Barajas is here to discuss her progress with her obesity treatment plan along with follow-up of her obesity related diagnoses. Jamie Barajas is on the Category 2 Plan and states she is following her eating plan approximately 98% of the time. Jamie Barajas states she is exercising for 0 minutes 0 times per week.  Today's visit was #: 6 Starting weight: 186 lbs Starting date: 07/03/2019 Today's weight: 166 lbs Today's date: 10/02/2019 Total lbs lost to date: 20 lbs Total lbs lost since last in-office visit: 5 lbs  Interim History: Jamie Barajas is doing well.  She has been spacing meals every 3-4 hours, otherwise she will get hungry.  She says she enjoyed Valentine's day.  Subjective:   1. Insulin resistance Marg has a diagnosis of insulin resistance based on her elevated fasting insulin level >5. She continues to work on diet and exercise to decrease her risk of diabetes.  Lab Results  Component Value Date   INSULIN 12.2 07/03/2019   Lab Results  Component Value Date   HGBA1C 5.5 07/03/2019   2. Vitamin D deficiency Jamie Barajas's Vitamin D level was 14.0 on 07/03/2019. She is currently taking vit D. She denies nausea, vomiting or muscle weakness.  3. Essential hypertension Review: taking medications as instructed, no medication side effects noted, no chest pain on exertion, no dyspnea on exertion, no swelling of ankles.  Blood pressure is at goal,  She is taking Norvasc and metoprolol.  BP Readings from Last 3 Encounters:  10/02/19 124/82  09/13/19 127/78  08/31/19 130/90   4. At risk for osteoporosis Jamie Barajas is at higher risk of osteopenia and osteoporosis due to Vitamin D deficiency.   Assessment/Plan:   1. Insulin resistance Jamie Barajas will continue to work on weight loss, exercise, and decreasing simple carbohydrates to help decrease the risk of diabetes. Jamie Barajas agreed to follow-up with Jamie Barajas as directed to closely monitor her progress.  2. Vitamin D deficiency Low Vitamin  D level contributes to fatigue and are associated with obesity, breast, and colon cancer. She agrees to continue to take prescription Vitamin D @50 ,000 IU every week and will follow-up for routine testing of Vitamin D, at least 2-3 times per year to avoid over-replacement.  Orders - Vitamin D, Ergocalciferol, (DRISDOL) 1.25 MG (50000 UNIT) CAPS capsule; Take 1 capsule (50,000 Units total) by mouth every 7 (seven) days.  Dispense: 4 capsule; Refill: 0  3. Essential hypertension Jamie Barajas is working on healthy weight loss and exercise to improve blood pressure control. We will watch for signs of hypotension as she continues her lifestyle modifications.  4. At risk for osteoporosis Jamie Barajas was given approximately 15 minutes of osteoporosis prevention counseling today. Jamie Barajas is at risk for osteopenia and osteoporosis due to her Vitamin D deficiency. She was encouraged to take her Vitamin D and follow her higher calcium diet and increase strengthening exercise to help strengthen her bones and decrease her risk of osteopenia and osteoporosis.  Repetitive spaced learning was employed today to elicit superior memory formation and behavioral change.  5. Class 1 obesity with serious comorbidity and body mass index (BMI) of 30.0 to 30.9 in adult, unspecified obesity type Jamie Barajas is currently in the action stage of change. As such, her goal is to continue with weight loss efforts. She has agreed to the Category 2 Plan +100 calorie snack.  Exercise goals: Moderate-high impact aerobics 3 days/week for 45 minutes +walking (bonus)  Behavioral modification strategies: increasing lean protein intake and increasing water intake.  Jamie Barajas has agreed to follow-up with our clinic in 2 weeks. She was informed of the importance of frequent follow-up visits to maximize her success with intensive lifestyle modifications for her multiple health conditions.   Objective:   Blood pressure 124/82, pulse 79, temperature 98.6 F (37  C), temperature source Oral, height 5\' 4"  (1.626 m), weight 166 lb (75.3 kg), last menstrual period 09/25/2019, SpO2 98 %. Body mass index is 28.49 kg/m.  General: Cooperative, alert, well developed, in no acute distress. HEENT: Conjunctivae and lids unremarkable. Cardiovascular: Regular rhythm.  Lungs: Normal work of breathing. Neurologic: No focal deficits.   Lab Results  Component Value Date   CREATININE 0.79 07/03/2019   BUN 12 07/03/2019   NA 140 07/03/2019   K 4.2 07/03/2019   CL 100 07/03/2019   CO2 23 07/03/2019   Lab Results  Component Value Date   ALT 12 07/03/2019   AST 20 07/03/2019   ALKPHOS 73 07/03/2019   BILITOT 0.3 07/03/2019   Lab Results  Component Value Date   HGBA1C 5.5 07/03/2019   Lab Results  Component Value Date   INSULIN 12.2 07/03/2019   Lab Results  Component Value Date   TSH 0.607 07/03/2019   Lab Results  Component Value Date   CHOL 157 07/03/2019   HDL 32 (L) 07/03/2019   LDLCALC 103 (H) 07/03/2019   TRIG 120 07/03/2019   CHOLHDL 4.3 12/16/2011   Lab Results  Component Value Date   WBC 6.3 07/03/2019   HGB 14.0 07/03/2019   HCT 43.3 07/03/2019   MCV 75 (L) 07/03/2019   PLT 261 07/03/2019   Lab Results  Component Value Date   IRON 72 07/03/2019   TIBC 321 07/03/2019   FERRITIN 36 07/03/2019   Attestation Statements:   Reviewed by clinician on day of visit: allergies, medications, problem list, medical history, surgical history, family history, social history, and previous encounter notes.  I, Water quality scientist, CMA, am acting as Location manager for [employer], DO.  I have reviewed the above documentation for accuracy and completeness, and I agree with the above. Briscoe Deutscher, DO

## 2019-10-23 ENCOUNTER — Encounter (INDEPENDENT_AMBULATORY_CARE_PROVIDER_SITE_OTHER): Payer: Self-pay | Admitting: Family Medicine

## 2019-10-23 ENCOUNTER — Ambulatory Visit (INDEPENDENT_AMBULATORY_CARE_PROVIDER_SITE_OTHER): Payer: BC Managed Care – PPO | Admitting: Family Medicine

## 2019-10-23 ENCOUNTER — Other Ambulatory Visit: Payer: Self-pay

## 2019-10-23 VITALS — BP 151/81 | HR 72 | Temp 98.3°F | Ht 64.0 in | Wt 167.0 lb

## 2019-10-23 DIAGNOSIS — I10 Essential (primary) hypertension: Secondary | ICD-10-CM

## 2019-10-23 DIAGNOSIS — Z6830 Body mass index (BMI) 30.0-30.9, adult: Secondary | ICD-10-CM

## 2019-10-23 DIAGNOSIS — E669 Obesity, unspecified: Secondary | ICD-10-CM

## 2019-10-23 DIAGNOSIS — R5383 Other fatigue: Secondary | ICD-10-CM

## 2019-10-23 DIAGNOSIS — E8881 Metabolic syndrome: Secondary | ICD-10-CM | POA: Diagnosis not present

## 2019-10-23 DIAGNOSIS — E559 Vitamin D deficiency, unspecified: Secondary | ICD-10-CM

## 2019-10-23 DIAGNOSIS — Z9189 Other specified personal risk factors, not elsewhere classified: Secondary | ICD-10-CM

## 2019-10-24 LAB — CBC WITH DIFFERENTIAL/PLATELET
Basophils Absolute: 0 10*3/uL (ref 0.0–0.2)
Basos: 0 %
EOS (ABSOLUTE): 0 10*3/uL (ref 0.0–0.4)
Eos: 0 %
Hemoglobin: 13.7 g/dL (ref 11.1–15.9)
Immature Grans (Abs): 0 10*3/uL (ref 0.0–0.1)
Immature Granulocytes: 0 %
Lymphocytes Absolute: 1.4 10*3/uL (ref 0.7–3.1)
Lymphs: 24 %
MCH: 23 pg — ABNORMAL LOW (ref 26.6–33.0)
MCHC: 31.6 g/dL (ref 31.5–35.7)
MCV: 73 fL — ABNORMAL LOW (ref 79–97)
Monocytes Absolute: 0.4 10*3/uL (ref 0.1–0.9)
Monocytes: 7 %
Neutrophils Absolute: 3.8 10*3/uL (ref 1.4–7.0)
Neutrophils: 69 %
Platelets: 288 10*3/uL (ref 150–450)
RBC: 5.96 x10E6/uL — ABNORMAL HIGH (ref 3.77–5.28)
RDW: 16.6 % — ABNORMAL HIGH (ref 11.7–15.4)
WBC: 5.6 10*3/uL (ref 3.4–10.8)

## 2019-10-24 LAB — ANEMIA PANEL
Ferritin: 13 ng/mL — ABNORMAL LOW (ref 15–150)
Folate, Hemolysate: 205 ng/mL
Folate, RBC: 472 ng/mL — ABNORMAL LOW (ref >498)
Hematocrit: 43.4 % (ref 34.0–46.6)
Iron Saturation: 19 % (ref 15–55)
Iron: 75 ug/dL (ref 27–159)
Retic Ct Pct: 1.3 % (ref 0.6–2.6)
Total Iron Binding Capacity: 389 ug/dL (ref 250–450)
UIBC: 314 ug/dL (ref 131–425)
Vitamin B-12: 533 pg/mL (ref 232–1245)

## 2019-10-24 LAB — COMPREHENSIVE METABOLIC PANEL
ALT: 14 IU/L (ref 0–32)
AST: 29 IU/L (ref 0–40)
Albumin/Globulin Ratio: 1.8 (ref 1.2–2.2)
Albumin: 4.8 g/dL (ref 3.8–4.8)
Alkaline Phosphatase: 72 IU/L (ref 39–117)
BUN/Creatinine Ratio: 18 (ref 9–23)
BUN: 12 mg/dL (ref 6–24)
Bilirubin Total: 0.6 mg/dL (ref 0.0–1.2)
CO2: 21 mmol/L (ref 20–29)
Calcium: 9.3 mg/dL (ref 8.7–10.2)
Chloride: 101 mmol/L (ref 96–106)
Creatinine, Ser: 0.68 mg/dL (ref 0.57–1.00)
GFR calc Af Amer: 118 mL/min/{1.73_m2} (ref >59)
GFR calc non Af Amer: 102 mL/min/{1.73_m2} (ref >59)
Globulin, Total: 2.7 g/dL (ref 1.5–4.5)
Glucose: 76 mg/dL (ref 65–99)
Potassium: 3.9 mmol/L (ref 3.5–5.2)
Sodium: 139 mmol/L (ref 134–144)
Total Protein: 7.5 g/dL (ref 6.0–8.5)

## 2019-10-24 LAB — TSH: TSH: 0.919 u[IU]/mL (ref 0.450–4.500)

## 2019-10-24 LAB — T4, FREE: Free T4: 1.26 ng/dL (ref 0.82–1.77)

## 2019-10-24 LAB — LIPID PANEL WITH LDL/HDL RATIO
Cholesterol, Total: 147 mg/dL (ref 100–199)
HDL: 39 mg/dL — ABNORMAL LOW (ref >39)
LDL Chol Calc (NIH): 92 mg/dL (ref 0–99)
LDL/HDL Ratio: 2.4 ratio (ref 0.0–3.2)
Triglycerides: 80 mg/dL (ref 0–149)
VLDL Cholesterol Cal: 16 mg/dL (ref 5–40)

## 2019-10-24 LAB — VITAMIN D 25 HYDROXY (VIT D DEFICIENCY, FRACTURES): Vit D, 25-Hydroxy: 43.8 ng/mL (ref 30.0–100.0)

## 2019-10-24 LAB — INSULIN, RANDOM: INSULIN: 5.9 u[IU]/mL (ref 2.6–24.9)

## 2019-10-24 LAB — T3: T3, Total: 110 ng/dL (ref 71–180)

## 2019-10-24 NOTE — Progress Notes (Signed)
Chief Complaint:   OBESITY Laprecious is here to discuss her progress with her obesity treatment plan along with follow-up of her obesity related diagnoses. Abrionna is on the Category 2 Plan and states she is following her eating plan approximately 80% of the time. Cathyann states she is walking, doing Daily Burn, strength training, yoga, and Latin dance for 45 minutes 4 times per week.  Today's visit was #: 7 Starting weight: 186 lbs Starting date: 07/03/2019 Today's weight: 167 lbs Today's date: 10/23/2019 Total lbs lost to date: 19 lbs Total lbs lost since last in-office visit: 0  Interim History: Iantha says she loves Daily Burn.  She has been drinking more water.  She says she has increased hunger.  She is up 1.5 pounds of water.  Subjective:   1. Essential hypertension Review: taking medications as instructed, no medication side effects noted, no chest pain on exertion, no dyspnea on exertion, no swelling of ankles.  She is taking Norvasc and Toprol for blood pressure control.  BP Readings from Last 3 Encounters:  10/23/19 (!) 151/81  10/02/19 124/82  09/13/19 127/78   2. Insulin resistance Lajoya has a diagnosis of insulin resistance based on her elevated fasting insulin level >5. She continues to work on diet and exercise to decrease her risk of diabetes.  Lab Results  Component Value Date   INSULIN 5.9 10/23/2019   INSULIN 12.2 07/03/2019   Lab Results  Component Value Date   HGBA1C 5.5 07/03/2019   3. Vitamin D deficiency Rakiya's Vitamin D level was 14.0 on 07/03/2019. She is currently taking vit D. She denies nausea, vomiting or muscle weakness.  4. Other fatigue Nessiah states she has increased fatigue.  5. At risk for heart disease Gaonou is at a higher than average risk for cardiovascular disease due to obesity. Reviewed: no chest pain on exertion, no dyspnea on exertion, and no swelling of ankles.  Assessment/Plan:   1. Essential hypertension Lowyn is working on  healthy weight loss and exercise to improve blood pressure control. We will watch for signs of hypotension as she continues her lifestyle modifications.  Orders - Comprehensive metabolic panel - CBC with Differential/Platelet - Lipid Panel With LDL/HDL Ratio  2. Insulin resistance Ellyette will continue to work on weight loss, exercise, and decreasing simple carbohydrates to help decrease the risk of diabetes. Jamaka agreed to follow-up with [place] as directed to closely monitor her progress.  Orders - Insulin, random  3. Vitamin D deficiency Low Vitamin D level contributes to fatigue and are associated with obesity, breast, and colon cancer. She agrees to continue to take prescription Vitamin D @50 ,000 IU every week and will follow-up for routine testing of Vitamin D, at least 2-3 times per year to avoid over-replacement.  Orders - VITAMIN D 25 Hydroxy (Vit-D Deficiency, Fractures)  4. Other fatigue Will check labs, as below.  Orders - VITAMIN D 25 Hydroxy (Vit-D Deficiency, Fractures) - T3 - T4, free - TSH - Anemia panel  5. At risk for heart disease Shaylon was given approximately 15 minutes of coronary artery disease prevention counseling today. She is 49 y.o. female and has risk factors for heart disease including obesity. We discussed intensive lifestyle modifications today with an emphasis on specific weight loss instructions and strategies.   Repetitive spaced learning was employed today to elicit superior memory formation and behavioral change.  6. Class 1 obesity with serious comorbidity and body mass index (BMI) of 30.0 to 30.9 in adult, unspecified  obesity type Disha is currently in the action stage of change. As such, her goal is to continue with weight loss efforts. She has agreed to the Category 2 Plan +400 calories.   Exercise goals: As is.  Behavioral modification strategies: increasing lean protein intake and increasing water intake.  Heilyn has agreed to follow-up  with our clinic in 2-3 weeks. She was informed of the importance of frequent follow-up visits to maximize her success with intensive lifestyle modifications for her multiple health conditions.   Nineveh was informed we would discuss her lab results at her next visit unless there is a critical issue that needs to be addressed sooner. Velera agreed to keep her next visit at the agreed upon time to discuss these results.  Objective:   Blood pressure (!) 151/81, pulse 72, temperature 98.3 F (36.8 C), temperature source Oral, height 5\' 4"  (1.626 m), weight 167 lb (75.8 kg), last menstrual period 09/25/2019, SpO2 98 %. Body mass index is 28.67 kg/m.  General: Cooperative, alert, well developed, in no acute distress. HEENT: Conjunctivae and lids unremarkable. Cardiovascular: Regular rhythm.  Lungs: Normal work of breathing. Neurologic: No focal deficits.   Lab Results  Component Value Date   CREATININE 0.68 10/23/2019   BUN 12 10/23/2019   NA 139 10/23/2019   K 3.9 10/23/2019   CL 101 10/23/2019   CO2 21 10/23/2019   Lab Results  Component Value Date   ALT 14 10/23/2019   AST 29 10/23/2019   ALKPHOS 72 10/23/2019   BILITOT 0.6 10/23/2019   Lab Results  Component Value Date   HGBA1C 5.5 07/03/2019   Lab Results  Component Value Date   INSULIN 5.9 10/23/2019   INSULIN 12.2 07/03/2019   Lab Results  Component Value Date   TSH WILL FOLLOW 10/23/2019   Lab Results  Component Value Date   CHOL 147 10/23/2019   HDL 39 (L) 10/23/2019   LDLCALC 92 10/23/2019   TRIG 80 10/23/2019   CHOLHDL 4.3 12/16/2011   Lab Results  Component Value Date   WBC 5.6 10/23/2019   HGB 13.7 10/23/2019   HCT 43.4 10/23/2019   MCV 73 (L) 10/23/2019   PLT 288 10/23/2019   Lab Results  Component Value Date   IRON 75 10/23/2019   TIBC 389 10/23/2019   FERRITIN WILL FOLLOW 10/23/2019   Attestation Statements:   Reviewed by clinician on day of visit: allergies, medications, problem list,  medical history, surgical history, family history, social history, and previous encounter notes.  I, Water quality scientist, CMA, am acting as Location manager for [employer], DO.  I have reviewed the above documentation for accuracy and completeness, and I agree with the above. Briscoe Deutscher, DO

## 2019-11-10 ENCOUNTER — Other Ambulatory Visit (INDEPENDENT_AMBULATORY_CARE_PROVIDER_SITE_OTHER): Payer: Self-pay | Admitting: Family Medicine

## 2019-11-10 DIAGNOSIS — E559 Vitamin D deficiency, unspecified: Secondary | ICD-10-CM

## 2019-11-14 ENCOUNTER — Ambulatory Visit (INDEPENDENT_AMBULATORY_CARE_PROVIDER_SITE_OTHER): Payer: BC Managed Care – PPO | Admitting: Family Medicine

## 2019-11-16 ENCOUNTER — Other Ambulatory Visit: Payer: Self-pay

## 2019-11-16 ENCOUNTER — Ambulatory Visit (INDEPENDENT_AMBULATORY_CARE_PROVIDER_SITE_OTHER): Payer: BC Managed Care – PPO | Admitting: Family Medicine

## 2019-11-16 ENCOUNTER — Encounter (INDEPENDENT_AMBULATORY_CARE_PROVIDER_SITE_OTHER): Payer: Self-pay | Admitting: Family Medicine

## 2019-11-16 VITALS — BP 122/77 | HR 76 | Temp 98.7°F | Ht 64.0 in | Wt 166.0 lb

## 2019-11-16 DIAGNOSIS — E669 Obesity, unspecified: Secondary | ICD-10-CM

## 2019-11-16 DIAGNOSIS — E786 Lipoprotein deficiency: Secondary | ICD-10-CM

## 2019-11-16 DIAGNOSIS — E8881 Metabolic syndrome: Secondary | ICD-10-CM

## 2019-11-16 DIAGNOSIS — E559 Vitamin D deficiency, unspecified: Secondary | ICD-10-CM

## 2019-11-16 DIAGNOSIS — K5909 Other constipation: Secondary | ICD-10-CM

## 2019-11-16 DIAGNOSIS — R790 Abnormal level of blood mineral: Secondary | ICD-10-CM | POA: Diagnosis not present

## 2019-11-16 DIAGNOSIS — I10 Essential (primary) hypertension: Secondary | ICD-10-CM

## 2019-11-16 DIAGNOSIS — Z6830 Body mass index (BMI) 30.0-30.9, adult: Secondary | ICD-10-CM

## 2019-11-18 MED ORDER — VITAMIN D (ERGOCALCIFEROL) 1.25 MG (50000 UNIT) PO CAPS: 50000.0000 [IU] | ORAL_CAPSULE | ORAL | 0 refills | Status: DC

## 2019-11-20 NOTE — Progress Notes (Signed)
Chief Complaint:   OBESITY Jamie Barajas is here to discuss her progress with her obesity treatment plan along with follow-up of her obesity related diagnoses. Jamie Barajas is on the Category 2 Plan +400 calories and states she is following her eating plan approximately 90% of the time. Jamie Barajas states she is doing Engineer, water for 45 minutes 4 times per week.  Today's visit was #: 8 Starting weight: 186 lbs Starting date: 07/03/2019 Today's weight: 166 lbs Today's date: 11/16/2019 Total lbs lost to date: 20 lbs Total lbs lost since last in-office visit: 1 lb  Interim History: Jamie Barajas says Daily Burn is working well.  We reviewed her labs and bioimpedance results.  She has made lots of improvements.  Subjective:   1. Vitamin D deficiency Jamie Barajas's Vitamin D level was 43.8 on 10/23/2019. She is currently taking prescription vitamin D 50,000 IU each week. She denies nausea, vomiting or muscle weakness.  2. Insulin resistance New labs show improvement.  Lab Results  Component Value Date   INSULIN 5.9 10/23/2019   INSULIN 12.2 07/03/2019   Lab Results  Component Value Date   HGBA1C 5.5 07/03/2019   3. Essential hypertension Review: taking medications as instructed, no medication side effects noted, no chest pain on exertion, no dyspnea on exertion, no swelling of ankles.   BP Readings from Last 3 Encounters:  11/16/19 122/77  10/23/19 (!) 151/81  10/02/19 124/82   4. Low ferritin  CBC Latest Ref Rng & Units 10/23/2019 07/03/2019 02/05/2012  WBC 3.4 - 10.8 x10E3/uL 5.6 6.3 6.4  Hemoglobin 11.1 - 15.9 g/dL 13.7 14.0 12.3  Hematocrit 34.0 - 46.6 % 43.4 43.3 35.6(L)  Platelets 150 - 450 x10E3/uL 288 261 351   Lab Results  Component Value Date   IRON 75 10/23/2019   TIBC 389 10/23/2019   FERRITIN 13 (L) 10/23/2019   Lab Results  Component Value Date   [id number] 10/23/2019   5. Low HDL (under 40) Jamie Barajas's HDL is 39.  It has increased from 32 on 07/03/2019.  Lab  Results  Component Value Date   ALT 14 10/23/2019   AST 29 10/23/2019   ALKPHOS 72 10/23/2019   BILITOT 0.6 10/23/2019   Lab Results  Component Value Date   CHOL 147 10/23/2019   HDL 39 (L) 10/23/2019   LDLCALC 92 10/23/2019   TRIG 80 10/23/2019   CHOLHDL 4.3 12/16/2011   6. Other constipation This has resolved using Benefiber and MiraLAX.  Assessment/Plan:   1. Vitamin D deficiency Low Vitamin D level contributes to fatigue and are associated with obesity, breast, and colon cancer. She agrees to continue to take prescription Vitamin D @50 ,000 IU every week and will follow-up for routine testing of Vitamin D, at least 2-3 times per year to avoid over-replacement.  Orders - Vitamin D, Ergocalciferol, (DRISDOL) 1.25 MG (50000 UNIT) CAPS capsule; Take 1 capsule (50,000 Units total) by mouth every 7 (seven) days.  Dispense: 4 capsule; Refill: 0  2. Insulin resistance Javen will continue to work on weight loss, exercise, and decreasing simple carbohydrates to help decrease the risk of diabetes. Camaron agreed to follow-up with [place] as directed to closely monitor her progress.  3. Essential hypertension Stormy is working on healthy weight loss and exercise to improve blood pressure control. We will watch for signs of hypotension as she continues her lifestyle modifications.  4. Low ferritin Orders and follow up as documented in patient record.  Counseling . Iron is essential for our bodies  to make red blood cells.  Reasons that someone may be deficient include: an iron-deficient diet (more likely in those following vegan or vegetarian diets), women with heavy menses, patients with GI disorders or poor absorption, patients that have had bariatric surgery, frequent blood donors, patients with cancer, and patients with heart disease.   [name] An iron supplement has been recommended. This is found over-the-counter. We recommend: Once Daily: Slow Fe 45mg  Iron Supplement for Iron Deficiency, Slow  Release, High Potency, Easy to Swallow Tablets..  . Iron-rich foods include dark leafy greens, red and white meats, eggs, seafood, and beans.   . Certain foods and drinks prevent your body from absorbing iron properly. Avoid eating these foods in the same meal as iron-rich foods or with iron supplements. These foods include: coffee, black tea, and red wine; milk, dairy products, and foods that are high in calcium; beans and soybeans; whole grains.  . Constipation can be a side effect of iron supplementation. Increased water and fiber intake are helpful. Water goal: > 2 liters/day. Fiber goal: > 25 grams/day.  5. Low HDL (under 40) Cardiovascular risk and specific lipid/LDL goals reviewed.  We discussed several lifestyle modifications today and Rolando will continue to work on diet, exercise and weight loss efforts. Orders and follow up as documented in patient record.   Counseling Intensive lifestyle modifications are the first line treatment for this issue. . Dietary changes: Increase soluble fiber. Decrease simple carbohydrates. . Exercise changes: Moderate to vigorous-intensity aerobic activity 150 minutes per week if tolerated. . Lipid-lowering medications: see documented in medical record.  6. Other constipation Jamie Barajas was informed that a decrease in bowel movement frequency is normal while losing weight, but stools should not be hard or painful. Orders and follow up as documented in patient record.   Counseling Getting to Good Bowel Health: Your goal is to have one soft bowel movement each day. Drink at least 8 glasses of water each day. Eat plenty of fiber (goal is over 25 grams each day). It is best to get most of your fiber from dietary sources which includes leafy green vegetables, fresh fruit, and whole grains. You may need to add fiber with the help of OTC fiber supplements. These include Metamucil, Citrucel, and Flaxseed. If you are still having trouble, try adding Miralax or Magnesium  Citrate. If all of these changes do not work, Cabin crew.  7. Class 1 obesity with serious comorbidity and body mass index (BMI) of 30.0 to 30.9 in adult, unspecified obesity type Shellene is currently in the action stage of change. As such, her goal is to continue with weight loss efforts. She has agreed to the Category 1 Plan.   Exercise goals: As is.  Behavioral modification strategies: increasing lean protein intake, decreasing simple carbohydrates and increasing water intake.  Jamie Barajas has agreed to follow-up with our clinic in 2 weeks. She was informed of the importance of frequent follow-up visits to maximize her success with intensive lifestyle modifications for her multiple health conditions.   Objective:   Blood pressure 122/77, pulse 76, temperature 98.7 F (37.1 C), temperature source Oral, height 5\' 4"  (1.626 m), weight 166 lb (75.3 kg), last menstrual period 10/21/2019, SpO2 99 %. Body mass index is 28.49 kg/m.  General: Cooperative, alert, well developed, in no acute distress. HEENT: Conjunctivae and lids unremarkable. Cardiovascular: Regular rhythm.  Lungs: Normal work of breathing. Neurologic: No focal deficits.   Lab Results  Component Value Date   CREATININE 0.68 10/23/2019  BUN 12 10/23/2019   NA 139 10/23/2019   K 3.9 10/23/2019   CL 101 10/23/2019   CO2 21 10/23/2019   Lab Results  Component Value Date   ALT 14 10/23/2019   AST 29 10/23/2019   ALKPHOS 72 10/23/2019   BILITOT 0.6 10/23/2019   Lab Results  Component Value Date   HGBA1C 5.5 07/03/2019   Lab Results  Component Value Date   INSULIN 5.9 10/23/2019   INSULIN 12.2 07/03/2019   Lab Results  Component Value Date   TSH 0.919 10/23/2019   Lab Results  Component Value Date   CHOL 147 10/23/2019   HDL 39 (L) 10/23/2019   LDLCALC 92 10/23/2019   TRIG 80 10/23/2019   CHOLHDL 4.3 12/16/2011   Lab Results  Component Value Date   WBC 5.6 10/23/2019   HGB 13.7 10/23/2019   HCT  43.4 10/23/2019   MCV 73 (L) 10/23/2019   PLT 288 10/23/2019   Lab Results  Component Value Date   IRON 75 10/23/2019   TIBC 389 10/23/2019   FERRITIN 13 (L) 10/23/2019   Attestation Statements:   Reviewed by clinician on day of visit: allergies, medications, problem list, medical history, surgical history, family history, social history, and previous encounter notes.  I, Water quality scientist, CMA, am acting as Location manager for [employer], DO.  I have reviewed the above documentation for accuracy and completeness, and I agree with the above. Briscoe Deutscher, DO

## 2019-11-29 ENCOUNTER — Encounter (INDEPENDENT_AMBULATORY_CARE_PROVIDER_SITE_OTHER): Payer: Self-pay | Admitting: Family Medicine

## 2019-11-29 ENCOUNTER — Ambulatory Visit (INDEPENDENT_AMBULATORY_CARE_PROVIDER_SITE_OTHER): Payer: BC Managed Care – PPO | Admitting: Family Medicine

## 2019-11-29 ENCOUNTER — Other Ambulatory Visit: Payer: Self-pay

## 2019-11-29 VITALS — BP 139/82 | HR 74 | Temp 99.0°F | Ht 64.0 in | Wt 167.0 lb

## 2019-11-29 DIAGNOSIS — E669 Obesity, unspecified: Secondary | ICD-10-CM

## 2019-11-29 DIAGNOSIS — E559 Vitamin D deficiency, unspecified: Secondary | ICD-10-CM

## 2019-11-29 DIAGNOSIS — Z6830 Body mass index (BMI) 30.0-30.9, adult: Secondary | ICD-10-CM

## 2019-11-29 DIAGNOSIS — I10 Essential (primary) hypertension: Secondary | ICD-10-CM | POA: Diagnosis not present

## 2019-11-29 DIAGNOSIS — R632 Polyphagia: Secondary | ICD-10-CM | POA: Diagnosis not present

## 2019-11-29 NOTE — Progress Notes (Signed)
Chief Complaint:   OBESITY Jamie Barajas is here to discuss her progress with her obesity treatment plan along with follow-up of her obesity related diagnoses. Jamie Barajas is on the Category 1 Plan and states she is following her eating plan approximately 70% of the time. Jamie Barajas states she is doing Daily Burn/Zumba/yoga/walking for 45 minutes 4 times per week.  Today's visit was #: 9 Starting weight: 186 lbs Starting date: 07/03/2019 Today's weight: 167 lbs Today's date: 11/29/2019 Total lbs lost to date: 19 lbs Total lbs lost since last in-office visit: 0  Interim History: Jamie Barajas has increased her exercise again.  She endorses increased hunger.  She says she is still concerned about her plateau (now for 2 months).  Subjective:   1. Essential hypertension Blood pressure is slightly elevated today.  No chest pain, shortness of breath, edema.  Bioimpedance results:  Up 2 pounds of fluid today.  She admits to being salt sensitive and says she had sausage at breakfast and pizza at lunch.  BP Readings from Last 3 Encounters:  11/29/19 139/82  11/16/19 122/77  10/23/19 (!) 151/81   2. Polyphagia Loney endorses excessive hunger.  She is now eating around 1300 calories a day.  Still with physical hunger since again increasing exercise.  3. Vitamin D deficiency Jamie Barajas's Vitamin D level was 43.8 on 10/23/2019. She is currently taking prescription vitamin D 50,000 IU each week. She denies nausea, vomiting or muscle weakness.  Assessment/Plan:   1. Essential hypertension Jamie Barajas is working on healthy weight loss and exercise to improve blood pressure control. We will watch for signs of hypotension as she continues her lifestyle modifications.  2. Polyphagia Increase to Category 3 plan.  IC at next visit.  Counseling . Polyphagia is excessive hunger. . Causes can include: low blood sugars, hypERthyroidism, PMS, lack of sleep, stress, insulin resistance, diabetes, certain medications, and diets that are  deficient in protein and fiber.   3. Vitamin D deficiency Low Vitamin D level contributes to fatigue and are associated with obesity, breast, and colon cancer. She agrees to continue to take prescription Vitamin D @50 ,000 IU every week and will follow-up for routine testing of Vitamin D, at least 2-3 times per year to avoid over-replacement.  4. Class 1 obesity with serious comorbidity and body mass index (BMI) of 30.0 to 30.9 in adult, unspecified obesity type Jamie Barajas is currently in the action stage of change. As such, her goal is to continue with weight loss efforts. She has agreed to the Category 3 Plan +300 calories daily or keeping a food journal and adhering to recommended goals of 1500 calories and 100+ protein.   Exercise goals: As is.  Behavioral modification strategies: increasing lean protein intake and increasing water intake.  Jamie Barajas has agreed to follow-up with our clinic in 2 weeks. She was informed of the importance of frequent follow-up visits to maximize her success with intensive lifestyle modifications for her multiple health conditions.   Objective:   Blood pressure 139/82, pulse 74, temperature 99 F (37.2 C), temperature source Oral, height 5\' 4"  (1.626 m), weight 167 lb (75.8 kg), SpO2 98 %. Body mass index is 28.67 kg/m.  General: Cooperative, alert, well developed, in no acute distress. HEENT: Conjunctivae and lids unremarkable. Cardiovascular: Regular rhythm.  Lungs: Normal work of breathing. Neurologic: No focal deficits.   Lab Results  Component Value Date   CREATININE 0.68 10/23/2019   BUN 12 10/23/2019   NA 139 10/23/2019   K 3.9 10/23/2019  CL 101 10/23/2019   CO2 21 10/23/2019   Lab Results  Component Value Date   ALT 14 10/23/2019   AST 29 10/23/2019   ALKPHOS 72 10/23/2019   BILITOT 0.6 10/23/2019   Lab Results  Component Value Date   HGBA1C 5.5 07/03/2019   Lab Results  Component Value Date   INSULIN 5.9 10/23/2019   INSULIN 12.2  07/03/2019   Lab Results  Component Value Date   TSH 0.919 10/23/2019   Lab Results  Component Value Date   CHOL 147 10/23/2019   HDL 39 (L) 10/23/2019   LDLCALC 92 10/23/2019   TRIG 80 10/23/2019   CHOLHDL 4.3 12/16/2011   Lab Results  Component Value Date   WBC 5.6 10/23/2019   HGB 13.7 10/23/2019   HCT 43.4 10/23/2019   MCV 73 (L) 10/23/2019   PLT 288 10/23/2019   Lab Results  Component Value Date   IRON 75 10/23/2019   TIBC 389 10/23/2019   FERRITIN 13 (L) 10/23/2019   Attestation Statements:   Reviewed by clinician on day of visit: allergies, medications, problem list, medical history, surgical history, family history, social history, and previous encounter notes.  I, Water quality scientist, CMA, am acting as Location manager for [employer], DO.  I have reviewed the above documentation for accuracy and completeness, and I agree with the above. Briscoe Deutscher, DO

## 2019-12-01 ENCOUNTER — Encounter (INDEPENDENT_AMBULATORY_CARE_PROVIDER_SITE_OTHER): Payer: Self-pay | Admitting: Family Medicine

## 2019-12-04 NOTE — Telephone Encounter (Signed)
Please advise thanks.

## 2019-12-07 ENCOUNTER — Encounter (INDEPENDENT_AMBULATORY_CARE_PROVIDER_SITE_OTHER): Payer: Self-pay

## 2019-12-12 NOTE — Telephone Encounter (Signed)
Information sent to patient.

## 2019-12-19 ENCOUNTER — Other Ambulatory Visit (INDEPENDENT_AMBULATORY_CARE_PROVIDER_SITE_OTHER): Payer: Self-pay | Admitting: Family Medicine

## 2019-12-19 DIAGNOSIS — E559 Vitamin D deficiency, unspecified: Secondary | ICD-10-CM

## 2019-12-25 ENCOUNTER — Ambulatory Visit (INDEPENDENT_AMBULATORY_CARE_PROVIDER_SITE_OTHER): Payer: BC Managed Care – PPO | Admitting: Family Medicine

## 2019-12-25 ENCOUNTER — Other Ambulatory Visit: Payer: Self-pay

## 2019-12-25 ENCOUNTER — Encounter (INDEPENDENT_AMBULATORY_CARE_PROVIDER_SITE_OTHER): Payer: Self-pay | Admitting: Family Medicine

## 2019-12-25 VITALS — BP 148/84 | HR 58 | Temp 98.5°F | Ht 64.0 in | Wt 164.0 lb

## 2019-12-25 DIAGNOSIS — R632 Polyphagia: Secondary | ICD-10-CM | POA: Diagnosis not present

## 2019-12-25 DIAGNOSIS — Z6830 Body mass index (BMI) 30.0-30.9, adult: Secondary | ICD-10-CM

## 2019-12-25 DIAGNOSIS — R0602 Shortness of breath: Secondary | ICD-10-CM

## 2019-12-25 DIAGNOSIS — E559 Vitamin D deficiency, unspecified: Secondary | ICD-10-CM

## 2019-12-25 DIAGNOSIS — E669 Obesity, unspecified: Secondary | ICD-10-CM

## 2019-12-25 DIAGNOSIS — I10 Essential (primary) hypertension: Secondary | ICD-10-CM | POA: Diagnosis not present

## 2019-12-25 NOTE — Progress Notes (Signed)
Chief Complaint:   OBESITY Jamie Barajas is here to discuss her progress with her obesity treatment plan along with follow-up of her obesity related diagnoses. Jamie Barajas is on the Category 3 Plan +300 calories and states she is following her eating plan approximately 80% of the time. Jamie Barajas states she is doing aerobics and walking for 45 minutes 4 times per week.  Today's visit was #: 10 Starting weight: 186 lbs Starting date: 07/03/2019 Today's weight: 164 lbs Today's date: 12/25/2019 Total lbs lost to date: 22 lbs Total lbs lost since last in-office visit: 3 lbs  Interim History: Jamie Barajas says she is getting 1500 calories and 100+ grams of protein a day.  She says she is not full but is satisfied.  She had an IC today that showed a decreased RMR of 1400.  Subjective:   1. Vitamin D deficiency Jamie Barajas's Vitamin D level was 43.8 on 10/23/2019. She is currently taking prescription vitamin D 50,000 IU each week. She denies nausea, vomiting or muscle weakness.  2. Polyphagia Jamie Barajas endorses excessive hunger.  Likely due to exercise.  Controlled with increased snacks.  3. Essential hypertension Review: taking medications as instructed, no medication side effects noted, no chest pain on exertion, no dyspnea on exertion, no swelling of ankles.  Blood pressure is elevated today.  BP Readings from Last 3 Encounters:  12/25/19 (!) 148/84  11/29/19 139/82  11/16/19 122/77   4. Shortness of breath on exertion IC completed today.  RMR decreased.  Assessment/Plan:   1. Vitamin D deficiency Low Vitamin D level contributes to fatigue and are associated with obesity, breast, and colon cancer. She agrees to continue to take prescription Vitamin D @50 ,000 IU every week and will follow-up for routine testing of Vitamin D, at least 2-3 times per year to avoid over-replacement.  2. Polyphagia Intensive lifestyle modifications are the first line treatment for this issue. We discussed several lifestyle  modifications today and she will continue to work on diet, exercise and weight loss efforts. Orders and follow up as documented in patient record.  Counseling . Polyphagia is excessive hunger. . Causes can include: low blood sugars, hypERthyroidism, PMS, lack of sleep, stress, insulin resistance, diabetes, certain medications, and diets that are deficient in protein and fiber.   3. Essential hypertension Jamie Barajas is working on healthy weight loss and exercise to improve blood pressure control. We will watch for signs of hypotension as she continues her lifestyle modifications.  4. Shortness of breath on exertion Will continue to monitor.  5. Class 1 obesity with serious comorbidity and body mass index (BMI) of 30.0 to 30.9 in adult, unspecified obesity type Jamie Barajas is currently in the action stage of change. As such, her goal is to continue with weight loss efforts. She has agreed to keeping a food journal and adhering to recommended goals of 1400 +400 calorie allowance for exercise-induced polyphagia and greater than 100 grams of protein.   Exercise goals: Increase strength.  Behavioral modification strategies: increasing water intake.  Jamie Barajas has agreed to follow-up with our clinic in 2 weeks. She was informed of the importance of frequent follow-up visits to maximize her success with intensive lifestyle modifications for her multiple health conditions.   Objective:   Blood pressure (!) 148/84, pulse (!) 58, temperature 98.5 F (36.9 C), temperature source Oral, height 5\' 4"  (1.626 m), weight 164 lb (74.4 kg), last menstrual period 12/14/2019, SpO2 99 %. Body mass index is 28.15 kg/m.  General: Cooperative, alert, well developed, in no  acute distress. HEENT: Conjunctivae and lids unremarkable. Cardiovascular: Regular rhythm.  Lungs: Normal work of breathing. Neurologic: No focal deficits.   Lab Results  Component Value Date   CREATININE 0.68 10/23/2019   BUN 12 10/23/2019   NA 139  10/23/2019   K 3.9 10/23/2019   CL 101 10/23/2019   CO2 21 10/23/2019   Lab Results  Component Value Date   ALT 14 10/23/2019   AST 29 10/23/2019   ALKPHOS 72 10/23/2019   BILITOT 0.6 10/23/2019   Lab Results  Component Value Date   HGBA1C 5.5 07/03/2019   Lab Results  Component Value Date   INSULIN 5.9 10/23/2019   INSULIN 12.2 07/03/2019   Lab Results  Component Value Date   TSH 0.919 10/23/2019   Lab Results  Component Value Date   CHOL 147 10/23/2019   HDL 39 (L) 10/23/2019   LDLCALC 92 10/23/2019   TRIG 80 10/23/2019   CHOLHDL 4.3 12/16/2011   Lab Results  Component Value Date   WBC 5.6 10/23/2019   HGB 13.7 10/23/2019   HCT 43.4 10/23/2019   MCV 73 (L) 10/23/2019   PLT 288 10/23/2019   Lab Results  Component Value Date   IRON 75 10/23/2019   TIBC 389 10/23/2019   FERRITIN 13 (L) 10/23/2019   Attestation Statements:   Reviewed by clinician on day of visit: allergies, medications, problem list, medical history, surgical history, family history, social history, and previous encounter notes.  I, Water quality scientist, CMA, am acting as Location manager for [employer], DO.  I have reviewed the above documentation for accuracy and completeness, and I agree with the above. Briscoe Deutscher, DO

## 2020-01-06 ENCOUNTER — Other Ambulatory Visit (INDEPENDENT_AMBULATORY_CARE_PROVIDER_SITE_OTHER): Payer: Self-pay | Admitting: Family Medicine

## 2020-01-06 DIAGNOSIS — E559 Vitamin D deficiency, unspecified: Secondary | ICD-10-CM

## 2020-01-22 ENCOUNTER — Ambulatory Visit (INDEPENDENT_AMBULATORY_CARE_PROVIDER_SITE_OTHER): Payer: BC Managed Care – PPO | Admitting: Family Medicine

## 2020-01-29 ENCOUNTER — Other Ambulatory Visit: Payer: Self-pay

## 2020-01-29 ENCOUNTER — Encounter (INDEPENDENT_AMBULATORY_CARE_PROVIDER_SITE_OTHER): Payer: Self-pay | Admitting: Family Medicine

## 2020-01-29 ENCOUNTER — Ambulatory Visit (INDEPENDENT_AMBULATORY_CARE_PROVIDER_SITE_OTHER): Payer: BC Managed Care – PPO | Admitting: Family Medicine

## 2020-01-29 VITALS — BP 123/78 | HR 67 | Temp 98.0°F | Ht 64.0 in | Wt 167.0 lb

## 2020-01-29 DIAGNOSIS — E559 Vitamin D deficiency, unspecified: Secondary | ICD-10-CM

## 2020-01-29 DIAGNOSIS — I10 Essential (primary) hypertension: Secondary | ICD-10-CM

## 2020-01-29 DIAGNOSIS — Z9189 Other specified personal risk factors, not elsewhere classified: Secondary | ICD-10-CM

## 2020-01-29 DIAGNOSIS — Z6830 Body mass index (BMI) 30.0-30.9, adult: Secondary | ICD-10-CM

## 2020-01-29 DIAGNOSIS — E669 Obesity, unspecified: Secondary | ICD-10-CM

## 2020-01-29 DIAGNOSIS — R632 Polyphagia: Secondary | ICD-10-CM

## 2020-01-30 MED ORDER — VITAMIN D (ERGOCALCIFEROL) 1.25 MG (50000 UNIT) PO CAPS: 50000.0000 [IU] | ORAL_CAPSULE | ORAL | 0 refills | Status: DC

## 2020-01-31 NOTE — Progress Notes (Signed)
Chief Complaint:   OBESITY Jamie Barajas is here to discuss her progress with her obesity treatment plan along with follow-up of her obesity related diagnoses. Jamie Barajas is on the Category 3 Plan and states she is following her eating plan approximately 50% of the time. Jamie Barajas states she is doing 0 minutes 0 times per week.  Today's visit was #: 11 Starting weight: 186 lbs Starting date: 07/03/2019 Today's weight: 167 lbs Today's date: 01/29/2020 Total lbs lost to date: 19 Total lbs lost since last in-office visit: 0  Interim History: Jamie Barajas had a toe injury and was laid up for 3 weeks. She was unable to exercise and her husband cooked, and she ate out more than usual. She notes a lot of unhealthy choices. She states she didn't follow the plan well and maybe 30% of the time. She likes the plan overall and has no problem following it.  Subjective:   1. Vitamin D deficiency Jamie Barajas's last Vit D level was 43.8. She denies problems with medications, and is tolerating it well.  2. Polyphagia Jamie Barajas notes her polyphagia has improved from prior. She denies complications or concerns.  3. Essential hypertension Jamie Barajas's last office visit her blood pressure was 148/84, now much better today at 123/78. She denies symptoms, headaches, dizziness, or lightheadedness.   4. At risk for heart disease Jamie Barajas is at a higher than average risk for cardiovascular disease due to obesity.   Assessment/Plan:   1. Vitamin D deficiency Low Vitamin D level contributes to fatigue and are associated with obesity, breast, and colon cancer. We will refill prescription Vitamin D for 1 month. Jamie Barajas will follow-up for routine testing of Vitamin D, at least 2-3 times per year to avoid over-replacement.  - Vitamin D, Ergocalciferol, (DRISDOL) 1.25 MG (50000 UNIT) CAPS capsule; Take 1 capsule (50,000 Units total) by mouth every 7 (seven) days.  Dispense: 4 capsule; Refill: 0  2. Polyphagia Intensive lifestyle modifications are  the first line treatment for this issue. We discussed several lifestyle modifications today and she will continue to work on diet, exercise and weight loss efforts. Orders and follow up as documented in patient record.  Counseling . Polyphagia is excessive hunger. . Causes can include: low blood sugars, hypERthyroidism, PMS, lack of sleep, stress, insulin resistance, diabetes, certain medications, and diets that are deficient in protein and fiber.   3. Essential hypertension Jamie Barajas is working on healthy weight loss and exercise to improve blood pressure control. We will watch for signs of hypotension as she continues her lifestyle modifications. Jamie Barajas will get blood pressure cuff and monitor, and she will keep a log and bring it into each office visit. She will continue her medications as directed for now.  4. At risk for heart disease Jamie Barajas was given approximately 15 minutes of coronary artery disease prevention counseling today. She is 49 y.o. female and has risk factors for heart disease including obesity. We discussed intensive lifestyle modifications today with an emphasis on specific weight loss instructions and strategies.   Repetitive spaced learning was employed today to elicit superior memory formation and behavioral change.  5. Class 1 obesity with serious comorbidity and body mass index (BMI) of 30.0 to 30.9 in adult, unspecified obesity type Jamie Barajas is currently in the action stage of change. As such, her goal is to continue with weight loss efforts. She has agreed to the Category 3 Plan.   Exercise goals: As is.  Behavioral modification strategies: increasing lean protein intake, meal planning and cooking  strategies and planning for success.  Jamie Barajas has agreed to follow-up with our clinic in 3 weeks with Dr. Juleen Barajas. She was informed of the importance of frequent follow-up visits to maximize her success with intensive lifestyle modifications for her multiple health conditions.    Objective:   Blood pressure 123/78, pulse 67, temperature 98 F (36.7 C), temperature source Oral, height 5\' 4"  (1.626 m), weight 167 lb (75.8 kg), SpO2 100 %. Body mass index is 28.67 kg/m.  General: Cooperative, alert, well developed, in no acute distress. HEENT: Conjunctivae and lids unremarkable. Cardiovascular: Regular rhythm.  Lungs: Normal work of breathing. Neurologic: No focal deficits.   Lab Results  Component Value Date   CREATININE 0.68 10/23/2019   BUN 12 10/23/2019   NA 139 10/23/2019   K 3.9 10/23/2019   CL 101 10/23/2019   CO2 21 10/23/2019   Lab Results  Component Value Date   ALT 14 10/23/2019   AST 29 10/23/2019   ALKPHOS 72 10/23/2019   BILITOT 0.6 10/23/2019   Lab Results  Component Value Date   HGBA1C 5.5 07/03/2019   Lab Results  Component Value Date   INSULIN 5.9 10/23/2019   INSULIN 12.2 07/03/2019   Lab Results  Component Value Date   TSH 0.919 10/23/2019   Lab Results  Component Value Date   CHOL 147 10/23/2019   HDL 39 (L) 10/23/2019   LDLCALC 92 10/23/2019   TRIG 80 10/23/2019   CHOLHDL 4.3 12/16/2011   Lab Results  Component Value Date   WBC 5.6 10/23/2019   HGB 13.7 10/23/2019   HCT 43.4 10/23/2019   MCV 73 (L) 10/23/2019   PLT 288 10/23/2019   Lab Results  Component Value Date   IRON 75 10/23/2019   TIBC 389 10/23/2019   FERRITIN 13 (L) 10/23/2019   Attestation Statements:   Reviewed by clinician on day of visit: allergies, medications, problem list, medical history, surgical history, family history, social history, and previous encounter notes.   Wilhemena Durie, am acting as transcriptionist for [employer], DO.  I have reviewed the above documentation for accuracy and completeness, and I agree with the above. Mellody Dance, DO

## 2020-02-04 ENCOUNTER — Other Ambulatory Visit (INDEPENDENT_AMBULATORY_CARE_PROVIDER_SITE_OTHER): Payer: Self-pay | Admitting: Family Medicine

## 2020-02-04 DIAGNOSIS — E559 Vitamin D deficiency, unspecified: Secondary | ICD-10-CM

## 2020-02-05 ENCOUNTER — Encounter (INDEPENDENT_AMBULATORY_CARE_PROVIDER_SITE_OTHER): Payer: Self-pay

## 2020-02-21 ENCOUNTER — Encounter (INDEPENDENT_AMBULATORY_CARE_PROVIDER_SITE_OTHER): Payer: Self-pay | Admitting: Family Medicine

## 2020-02-21 ENCOUNTER — Ambulatory Visit (INDEPENDENT_AMBULATORY_CARE_PROVIDER_SITE_OTHER): Payer: BC Managed Care – PPO | Admitting: Family Medicine

## 2020-02-21 ENCOUNTER — Other Ambulatory Visit: Payer: Self-pay

## 2020-02-21 VITALS — BP 128/81 | HR 62 | Temp 98.1°F | Wt 165.0 lb

## 2020-02-21 DIAGNOSIS — I10 Essential (primary) hypertension: Secondary | ICD-10-CM

## 2020-02-21 DIAGNOSIS — Z9189 Other specified personal risk factors, not elsewhere classified: Secondary | ICD-10-CM

## 2020-02-21 DIAGNOSIS — E669 Obesity, unspecified: Secondary | ICD-10-CM

## 2020-02-21 DIAGNOSIS — E559 Vitamin D deficiency, unspecified: Secondary | ICD-10-CM

## 2020-02-21 DIAGNOSIS — Z6830 Body mass index (BMI) 30.0-30.9, adult: Secondary | ICD-10-CM

## 2020-02-22 NOTE — Progress Notes (Signed)
Chief Complaint:   OBESITY Jamie Barajas is here to discuss her progress with her obesity treatment plan along with follow-up of her obesity related diagnoses. Jamie Barajas is on the Category 3 Plan and states she is following her eating plan approximately 60% of the time. [name] states she is walking for 45 minutes 2 times per week.  Today's visit was #: 12 Starting weight: 186 lbs Starting date: 07/03/2019 Today's weight: 165 lbs Today's date: 02/21/2020 Total lbs lost to date: 21 lbs Total lbs lost since last in-office visit: 2 lbs  Interim History: Jamie Barajas has suffered a broken great toe since her last visit.  She her twin 71-year-old nieces for 2 weeks (daughters of recently deceased sister).  Her older sister was diagnosed with stage 3 breast CA (in [place]).  Subjective:   1. Essential hypertension Review: taking medications as instructed, no medication side effects noted, no chest pain on exertion, no dyspnea on exertion, no swelling of ankles.   BP Readings from Last 3 Encounters:  02/21/20 128/81  01/29/20 123/78  12/25/19 (!) 148/84   2. Vitamin D deficiency Adalea's Vitamin D level was 43.8 on 10/23/2019. She is currently taking prescription vitamin D 50,000 IU each week. She denies nausea, vomiting or muscle weakness.  Assessment/Plan:   1. Essential hypertension Paddy is working on healthy weight loss and exercise to improve blood pressure control. We will watch for signs of hypotension as she continues her lifestyle modifications.  2. Vitamin D deficiency Low Vitamin D level contributes to fatigue and are associated with obesity, breast, and colon cancer. She agrees to continue to take prescription Vitamin D @50 ,000 IU every week and will follow-up for routine testing of Vitamin D, at least 2-3 times per year to avoid over-replacement.  3. At risk for deficient intake of food Jamie Barajas was given approximately 15 minutes of deficit intake of food prevention counseling today. Jamie Barajas is  at risk for eating too few calories based on current food recall. She was encouraged to focus on meeting caloric and protein goals according to her recommended meal plan.   4. Class 1 obesity with serious comorbidity and body mass index (BMI) of 30.0 to 30.9 in adult, unspecified obesity type [name] is currently in the action stage of change. As such, her goal is to continue with weight loss efforts. She has agreed to the Category 3 Plan.   Exercise goals: For substantial health benefits, adults should do at least 150 minutes (2 hours and 30 minutes) a week of moderate-intensity, or 75 minutes (1 hour and 15 minutes) a week of vigorous-intensity aerobic physical activity, or an equivalent combination of moderate- and vigorous-intensity aerobic activity. Aerobic activity should be performed in episodes of at least 10 minutes, and preferably, it should be spread throughout the week.  Behavioral modification strategies: increasing lean protein intake and emotional eating strategies.  Jamie Barajas has agreed to follow-up with our clinic in 3-4 weeks. She was informed of the importance of frequent follow-up visits to maximize her success with intensive lifestyle modifications for her multiple health conditions.   Objective:   Blood pressure 128/81, pulse 62, temperature 98.1 F (36.7 C), temperature source Oral, weight 165 lb (74.8 kg), SpO2 100 %. Body mass index is 28.32 kg/m.  General: Cooperative, alert, well developed, in no acute distress. HEENT: Conjunctivae and lids unremarkable. Cardiovascular: Regular rhythm.  Lungs: Normal work of breathing. Neurologic: No focal deficits.   Lab Results  Component Value Date   CREATININE 0.68 10/23/2019  BUN 12 10/23/2019   NA 139 10/23/2019   K 3.9 10/23/2019   CL 101 10/23/2019   CO2 21 10/23/2019   Lab Results  Component Value Date   ALT 14 10/23/2019   AST 29 10/23/2019   ALKPHOS 72 10/23/2019   BILITOT 0.6 10/23/2019   Lab Results    Component Value Date   HGBA1C 5.5 07/03/2019   Lab Results  Component Value Date   INSULIN 5.9 10/23/2019   INSULIN 12.2 07/03/2019   Lab Results  Component Value Date   TSH 0.919 10/23/2019   Lab Results  Component Value Date   CHOL 147 10/23/2019   HDL 39 (L) 10/23/2019   LDLCALC 92 10/23/2019   TRIG 80 10/23/2019   CHOLHDL 4.3 12/16/2011   Lab Results  Component Value Date   WBC 5.6 10/23/2019   HGB 13.7 10/23/2019   HCT 43.4 10/23/2019   MCV 73 (L) 10/23/2019   PLT 288 10/23/2019   Lab Results  Component Value Date   IRON 75 10/23/2019   TIBC 389 10/23/2019   FERRITIN 13 (L) 10/23/2019   Attestation Statements:   Reviewed by clinician on day of visit: allergies, medications, problem list, medical history, surgical history, family history, social history, and previous encounter notes.  I, Water quality scientist, CMA, am acting as transcriptionist for Briscoe Deutscher, DO  I have reviewed the above documentation for accuracy and completeness, and I agree with the above. Briscoe Deutscher, DO

## 2020-02-29 ENCOUNTER — Encounter (INDEPENDENT_AMBULATORY_CARE_PROVIDER_SITE_OTHER): Payer: Self-pay | Admitting: Family Medicine

## 2020-03-12 ENCOUNTER — Encounter (INDEPENDENT_AMBULATORY_CARE_PROVIDER_SITE_OTHER): Payer: Self-pay

## 2020-03-18 ENCOUNTER — Ambulatory Visit (INDEPENDENT_AMBULATORY_CARE_PROVIDER_SITE_OTHER): Payer: BC Managed Care – PPO | Admitting: Family Medicine

## 2020-03-20 ENCOUNTER — Ambulatory Visit (INDEPENDENT_AMBULATORY_CARE_PROVIDER_SITE_OTHER): Payer: BC Managed Care – PPO | Admitting: Family Medicine

## 2020-03-20 ENCOUNTER — Other Ambulatory Visit: Payer: Self-pay

## 2020-03-20 ENCOUNTER — Encounter (INDEPENDENT_AMBULATORY_CARE_PROVIDER_SITE_OTHER): Payer: Self-pay | Admitting: Family Medicine

## 2020-03-20 VITALS — BP 123/79 | HR 63 | Temp 98.3°F | Ht 64.0 in | Wt 172.0 lb

## 2020-03-20 DIAGNOSIS — Z6830 Body mass index (BMI) 30.0-30.9, adult: Secondary | ICD-10-CM

## 2020-03-20 DIAGNOSIS — E669 Obesity, unspecified: Secondary | ICD-10-CM

## 2020-03-20 DIAGNOSIS — E559 Vitamin D deficiency, unspecified: Secondary | ICD-10-CM

## 2020-03-20 DIAGNOSIS — I10 Essential (primary) hypertension: Secondary | ICD-10-CM

## 2020-03-20 DIAGNOSIS — Z9189 Other specified personal risk factors, not elsewhere classified: Secondary | ICD-10-CM

## 2020-03-20 NOTE — Progress Notes (Signed)
Chief Complaint:   OBESITY Jamie Barajas is here to discuss her progress with her obesity treatment plan along with follow-up of her obesity related diagnoses. Jamie Barajas is on the Category 2 Plan and states she is following her eating plan approximately 50% of the time. Jamie Barajas states she is walking and doing barre for 45 minutes 3 times per week.  Today's visit was #: 46 Starting weight: 186 lbs Starting date: 07/03/2019 Today's weight: 172 lbs Today's date: 03/20/2020 Total lbs lost to date: 14 lbs Total lbs lost since last in-office visit: 0 Total percentage of weight loss to date: 7.5%  Interim History: Jamie Barajas endorses mild constipation.  She says she has not been eating as many greens.  She has decreased her water intake and has been doing a lot of cardio.  She had labs with her PCP last week, and we reviewed them together, in [place].   Assessment/Plan:   1. Vitamin D deficiency Not optimized. [name]'s vitamin D level was 36 on 02/20/20.She agrees to continue to take prescription Vitamin D @50 ,000 IU every week and will follow-up for routine testing of Vitamin D, at least 2-3 times per year to avoid over-replacement.  2. Essential hypertension We will watch for signs of hypotension as she continues her lifestyle modifications.   BP Readings from Last 3 Encounters:  03/20/20 123/79  02/21/20 128/81  01/29/20 123/78   3. At risk for heart disease [name] was given approximately 15 minutes of coronary artery disease prevention counseling today. She is 49 y.o. female and has risk factors for heart disease including obesity. We discussed intensive lifestyle modifications today with an emphasis on specific weight loss instructions and strategies.    Repetitive spaced learning was employed today to elicit superior memory formation and behavioral change.  4. Class 1 obesity with serious comorbidity and body mass index (BMI) of 30.0 to 30.9 in adult, unspecified obesity type Jamie Barajas is  currently in the action stage of change. As such, her goal is to continue with weight loss efforts. She has agreed to keeping a food journal and adhering to recommended goals of 1000-2000 calories and 95 grams of protein.   Exercise goals: For substantial health benefits, adults should do at least 150 minutes (2 hours and 30 minutes) a week of moderate-intensity, or 75 minutes (1 hour and 15 minutes) a week of vigorous-intensity aerobic physical activity, or an equivalent combination of moderate- and vigorous-intensity aerobic activity. Aerobic activity should be performed in episodes of at least 10 minutes, and preferably, it should be spread throughout the week.  Behavioral modification strategies: increasing vegetables, increasing water intake and increasing high fiber foods.  Focus on [place].  Increase vegetables/fruits/water.  Decrease calories slightly except if increased hunger on exercise days.  [name] has agreed to follow-up with our clinic in 2-3 weeks. She was informed of the importance of frequent follow-up visits to maximize her success with intensive lifestyle modifications for her multiple health conditions.   Objective:   Blood pressure 123/79, pulse 63, temperature 98.3 F (36.8 C), temperature source Oral, height 5\' 4"  (1.626 m), weight 172 lb (78 kg), SpO2 100 %. Body mass index is 29.52 kg/m.  General: Cooperative, alert, well developed, in no acute distress. HEENT: Conjunctivae and lids unremarkable. Cardiovascular: Regular rhythm.  Lungs: Normal work of breathing. Neurologic: No focal deficits.   Lab Results  Component Value Date   CREATININE 0.68 10/23/2019   BUN 12 10/23/2019   NA 139 10/23/2019   K 3.9  10/23/2019   CL 101 10/23/2019   CO2 21 10/23/2019   Lab Results  Component Value Date   ALT 14 10/23/2019   AST 29 10/23/2019   ALKPHOS 72 10/23/2019   BILITOT 0.6 10/23/2019   Lab Results  Component Value Date   HGBA1C 5.5 07/03/2019   Lab Results    Component Value Date   INSULIN 5.9 10/23/2019   INSULIN 12.2 07/03/2019   Lab Results  Component Value Date   TSH 0.919 10/23/2019   Lab Results  Component Value Date   CHOL 147 10/23/2019   HDL 39 (L) 10/23/2019   LDLCALC 92 10/23/2019   TRIG 80 10/23/2019   CHOLHDL 4.3 12/16/2011   Lab Results  Component Value Date   WBC 5.6 10/23/2019   HGB 13.7 10/23/2019   HCT 43.4 10/23/2019   MCV 73 (L) 10/23/2019   PLT 288 10/23/2019   Lab Results  Component Value Date   IRON 75 10/23/2019   TIBC 389 10/23/2019   FERRITIN 13 (L) 10/23/2019   Attestation Statements:   Reviewed by clinician on day of visit: allergies, medications, problem list, medical history, surgical history, family history, social history, and previous encounter notes.  I, Water quality scientist, CMA, am acting as transcriptionist for Briscoe Deutscher, DO  I have reviewed the above documentation for accuracy and completeness, and I agree with the above. Briscoe Deutscher, DO

## 2020-04-03 ENCOUNTER — Ambulatory Visit (INDEPENDENT_AMBULATORY_CARE_PROVIDER_SITE_OTHER): Payer: BC Managed Care – PPO | Admitting: Physician Assistant

## 2020-04-29 ENCOUNTER — Ambulatory Visit (INDEPENDENT_AMBULATORY_CARE_PROVIDER_SITE_OTHER): Payer: BC Managed Care – PPO | Admitting: Family Medicine

## 2020-05-17 ENCOUNTER — Other Ambulatory Visit (INDEPENDENT_AMBULATORY_CARE_PROVIDER_SITE_OTHER): Payer: Self-pay | Admitting: Family Medicine

## 2020-05-17 DIAGNOSIS — E559 Vitamin D deficiency, unspecified: Secondary | ICD-10-CM

## 2020-05-18 ENCOUNTER — Other Ambulatory Visit (HOSPITAL_COMMUNITY): Payer: Self-pay | Admitting: Interventional Radiology

## 2020-05-18 DIAGNOSIS — D259 Leiomyoma of uterus, unspecified: Secondary | ICD-10-CM

## 2020-05-20 ENCOUNTER — Encounter (INDEPENDENT_AMBULATORY_CARE_PROVIDER_SITE_OTHER): Payer: Self-pay

## 2020-05-20 NOTE — Telephone Encounter (Signed)
Refill form completed and given to MD for review and approval/denial

## 2020-05-20 NOTE — Telephone Encounter (Signed)
MD indicates that pt needs an office visit and labs prior to refilling medication. MyChart message sent to pt requesting her to schedule a visit with labs, and medication denied.

## 2020-06-12 ENCOUNTER — Other Ambulatory Visit: Payer: Self-pay

## 2020-06-12 ENCOUNTER — Encounter (INDEPENDENT_AMBULATORY_CARE_PROVIDER_SITE_OTHER): Payer: Self-pay | Admitting: Family Medicine

## 2020-06-12 ENCOUNTER — Ambulatory Visit (INDEPENDENT_AMBULATORY_CARE_PROVIDER_SITE_OTHER): Payer: BC Managed Care – PPO | Admitting: Family Medicine

## 2020-06-12 VITALS — BP 152/78 | HR 65 | Temp 98.4°F | Ht 64.0 in | Wt 164.0 lb

## 2020-06-12 DIAGNOSIS — H04129 Dry eye syndrome of unspecified lacrimal gland: Secondary | ICD-10-CM | POA: Diagnosis not present

## 2020-06-12 DIAGNOSIS — Z9189 Other specified personal risk factors, not elsewhere classified: Secondary | ICD-10-CM

## 2020-06-12 DIAGNOSIS — I10 Essential (primary) hypertension: Secondary | ICD-10-CM | POA: Diagnosis not present

## 2020-06-12 DIAGNOSIS — E669 Obesity, unspecified: Secondary | ICD-10-CM

## 2020-06-12 DIAGNOSIS — Z6830 Body mass index (BMI) 30.0-30.9, adult: Secondary | ICD-10-CM

## 2020-06-12 DIAGNOSIS — R768 Other specified abnormal immunological findings in serum: Secondary | ICD-10-CM

## 2020-06-15 NOTE — Progress Notes (Signed)
Chief Complaint:   OBESITY Jamie Barajas is here to discuss her progress with her obesity treatment plan along with follow-up of her obesity related diagnoses.   Interim History: Recently diagnosed with dry eye, suspicious for autoimmune disease. Had + ANA, with titer 1:320, speckled pattern. Her PCP has referred her to Rheumatology.   Nutrition Plan: the Category 2 Plan.  Anti-obesity medications: None.  Hunger is moderately controlled controlled. Cravings are moderately controlled controlled.  Activity: Daily Burn. Working with a Clinical research associate this week.  Assessment/Plan:   1. Dry eye Now, with positive ANA. She does not have an Ophthalmologist. Will refer. We will continue to monitor symptoms as they relate to her weight loss journey.  - Ambulatory referral to Ophthalmology  2. Essential hypertension Elevated today. Medications: Amlodipine, Metoprolol. Diet: Avoid buying foods that are: processed, frozen, or prepackaged to avoid excess salt. She will continue to focus on protein-rich, low simple carbohydrate foods. We reviewed the importance of hydration, regular exercise for stress reduction, and restorative sleep.   BP Readings from Last 3 Encounters:  06/12/20 (!) 152/78  03/20/20 123/79  02/21/20 128/81   Lab Results  Component Value Date   CREATININE 0.68 10/23/2019   3. Positive ANA (antinuclear antibody) See Care Everywhere. Recent high ANA, checked after Optometrist noted bilateral dry eye that he felt was consistent with autoimmune disease, per patient. She has been referred to Rheumatology, but does not have an appointment yet. We will continue to monitor symptoms as they relate to her weight loss journey.  4. At risk for deficient intake of food Lawrie was given approximately 15 minutes of deficit intake of food prevention counseling today. [name] is at risk for eating too few calories based on current food recall. She was encouraged to focus on meeting caloric and protein  goals according to her recommended meal plan.   5. Class 1 obesity with serious comorbidity and body mass index (BMI) of 30.0 to 30.9 in adult, unspecified obesity type The current medical regimen is effective;  continue present plan and medications.  [name] is currently in the action stage of change. As such, her goal is to continue with weight loss efforts.   Nutrition goals: She has agreed to the Category 2 Plan.   Exercise goals: For substantial health benefits, adults should do at least 150 minutes (2 hours and 30 minutes) a week of moderate-intensity, or 75 minutes (1 hour and 15 minutes) a week of vigorous-intensity aerobic physical activity, or an equivalent combination of moderate- and vigorous-intensity aerobic activity. Aerobic activity should be performed in episodes of at least 10 minutes, and preferably, it should be spread throughout the week. Adults should also include muscle-strengthening activities that involve all major muscle groups on 2 or more days a week.  Behavioral modification strategies: increasing lean protein intake, decreasing simple carbohydrates, increasing vegetables and increasing water intake.  [name] has agreed to follow-up with our clinic in 3 weeks. She was informed of the importance of frequent follow-up visits to maximize her success with intensive lifestyle modifications for her multiple health conditions.   Objective:   Blood pressure (!) 152/78, pulse 65, temperature 98.4 F (36.9 C), height 5\' 4"  (1.626 m), weight 164 lb (74.4 kg), SpO2 98 %. Body mass index is 28.15 kg/m.  General: Cooperative, alert, well developed, in no acute distress. HEENT: Conjunctivae and lids unremarkable. Cardiovascular: Regular rhythm.  Lungs: Normal work of breathing. Neurologic: No focal deficits.   Lab Results  Component Value Date  CREATININE 0.68 10/23/2019   BUN 12 10/23/2019   NA 139 10/23/2019   K 3.9 10/23/2019   CL 101 10/23/2019   CO2 21 10/23/2019    Lab Results  Component Value Date   ALT 14 10/23/2019   AST 29 10/23/2019   ALKPHOS 72 10/23/2019   BILITOT 0.6 10/23/2019   Lab Results  Component Value Date   HGBA1C 5.5 07/03/2019   Lab Results  Component Value Date   INSULIN 5.9 10/23/2019   INSULIN 12.2 07/03/2019   Lab Results  Component Value Date   TSH 0.919 10/23/2019   Lab Results  Component Value Date   CHOL 147 10/23/2019   HDL 39 (L) 10/23/2019   LDLCALC 92 10/23/2019   TRIG 80 10/23/2019   CHOLHDL 4.3 12/16/2011   Lab Results  Component Value Date   WBC 5.6 10/23/2019   HGB 13.7 10/23/2019   HCT 43.4 10/23/2019   MCV 73 (L) 10/23/2019   PLT 288 10/23/2019   Lab Results  Component Value Date   IRON 75 10/23/2019   TIBC 389 10/23/2019   FERRITIN 13 (L) 10/23/2019   Attestation Statements:   Reviewed by clinician on day of visit: allergies, medications, problem list, medical history, surgical history, family history, social history, and previous encounter notes.

## 2020-07-08 ENCOUNTER — Ambulatory Visit (INDEPENDENT_AMBULATORY_CARE_PROVIDER_SITE_OTHER): Payer: BC Managed Care – PPO | Admitting: Family Medicine

## 2020-07-09 ENCOUNTER — Other Ambulatory Visit: Payer: Self-pay

## 2020-07-09 ENCOUNTER — Ambulatory Visit (INDEPENDENT_AMBULATORY_CARE_PROVIDER_SITE_OTHER): Payer: BC Managed Care – PPO | Admitting: Family Medicine

## 2020-07-09 ENCOUNTER — Encounter (INDEPENDENT_AMBULATORY_CARE_PROVIDER_SITE_OTHER): Payer: Self-pay | Admitting: Family Medicine

## 2020-07-09 VITALS — BP 147/78 | HR 70 | Temp 98.0°F | Ht 64.0 in | Wt 160.0 lb

## 2020-07-09 DIAGNOSIS — Z9189 Other specified personal risk factors, not elsewhere classified: Secondary | ICD-10-CM | POA: Diagnosis not present

## 2020-07-09 DIAGNOSIS — I10 Essential (primary) hypertension: Secondary | ICD-10-CM

## 2020-07-09 DIAGNOSIS — Z6830 Body mass index (BMI) 30.0-30.9, adult: Secondary | ICD-10-CM

## 2020-07-09 DIAGNOSIS — E559 Vitamin D deficiency, unspecified: Secondary | ICD-10-CM | POA: Diagnosis not present

## 2020-07-09 DIAGNOSIS — E6609 Other obesity due to excess calories: Secondary | ICD-10-CM

## 2020-07-09 MED ORDER — VITAMIN D (ERGOCALCIFEROL) 1.25 MG (50000 UNIT) PO CAPS: 50000.0000 [IU] | ORAL_CAPSULE | ORAL | 0 refills | Status: DC

## 2020-07-10 NOTE — Progress Notes (Signed)
Chief Complaint:   OBESITY Jamie Barajas is here to discuss her progress with her obesity treatment plan along with follow-up of her obesity related diagnoses. Jamie Barajas is on the Category 2 Plan and states she is following her eating plan approximately 80% of the time. Jamie Barajas states she is doing cardio and strengthening for 30-45 minutes 4 times per week.  Today's visit was #: 15 Starting weight: 186 lbs Starting date: 07/03/2019 Today's weight: 160 lbs Today's date: 07/09/2020 Total lbs lost to date: 26 Total lbs lost since last in-office visit: 4  Interim History: Jamie Barajas continues to do well with weight loss on her Category 2 plan, and is exercising regularly and with variety. She feels her hunger is well controlled. She is working on meal planning and prepping.  Subjective:   1. Vitamin D deficiency Jamie Barajas continues to well on Vit D, and she requests a refill today. She is due for labs soon.  2. Essential hypertension Jamie Barajas's blood pressure is stable on her medications. She is doing well with weight loss and exercise, and she is trying to decrease sodium in her diet.  3. At risk for complication associated with hypotension Jamie Barajas is at a higher than average risk of hypotension due to continued weight loss.  Assessment/Plan:   1. Vitamin D deficiency Low Vitamin D level contributes to fatigue and are associated with obesity, breast, and colon cancer. We will refill prescription Vitamin D for 1 month, and we will recheck labs at her next visit. Jamie Barajas will follow-up for routine testing of Vitamin D, at least 2-3 times per year to avoid over-replacement.  - Vitamin D, Ergocalciferol, (DRISDOL) 1.25 MG (50000 UNIT) CAPS capsule; Take 1 capsule (50,000 Units total) by mouth every 7 (seven) days.  Dispense: 4 capsule; Refill: 0  2. Essential hypertension Jamie Barajas is working on healthy weight loss and exercise to improve blood pressure control. We will watch for signs of hypotension as she  continues her lifestyle modifications. Lower sodium options were discussed, and Jamie Barajas was congratulated on her hard work.  3. At risk for complication associated with hypotension Jamie Barajas was given approximately 15 minutes of education and counseling today to help avoid hypotension. We discussed risks of hypotension with weight loss and signs of hypotension such as feeling lightheaded or unsteady.  Repetitive spaced learning was employed today to elicit superior memory formation and behavioral change.  4. Class 1 obesity due to excess calories with serious comorbidity and body mass index (BMI) of 30.0 to 30.9 in adult Jamie Barajas is currently in the action stage of change. As such, her goal is to continue with weight loss efforts. She has agreed to keeping a food journal and adhering to recommended goals of 1200-1400 calories and 90+ grams of protein daily.   Exercise goals: As is.  Behavioral modification strategies: increasing water intake, decreasing sodium intake, meal planning and cooking strategies and holiday eating strategies .  Jamie Barajas has agreed to follow-up with our clinic in 3 to 4 weeks. She was informed of the importance of frequent follow-up visits to maximize her success with intensive lifestyle modifications for her multiple health conditions.   Objective:   Blood pressure (!) 147/78, pulse 70, temperature 98 F (36.7 C), height 5\' 4"  (1.626 m), weight 160 lb (72.6 kg), SpO2 98 %. Body mass index is 27.46 kg/m.  General: Cooperative, alert, well developed, in no acute distress. HEENT: Conjunctivae and lids unremarkable. Cardiovascular: Regular rhythm.  Lungs: Normal work of breathing. Neurologic: No focal deficits.  Lab Results  Component Value Date   CREATININE 0.68 10/23/2019   BUN 12 10/23/2019   NA 139 10/23/2019   K 3.9 10/23/2019   CL 101 10/23/2019   CO2 21 10/23/2019   Lab Results  Component Value Date   ALT 14 10/23/2019   AST 29 10/23/2019   ALKPHOS 72  10/23/2019   BILITOT 0.6 10/23/2019   Lab Results  Component Value Date   HGBA1C 5.5 07/03/2019   Lab Results  Component Value Date   INSULIN 5.9 10/23/2019   INSULIN 12.2 07/03/2019   Lab Results  Component Value Date   TSH 0.919 10/23/2019   Lab Results  Component Value Date   CHOL 147 10/23/2019   HDL 39 (L) 10/23/2019   LDLCALC 92 10/23/2019   TRIG 80 10/23/2019   CHOLHDL 4.3 12/16/2011   Lab Results  Component Value Date   WBC 5.6 10/23/2019   HGB 13.7 10/23/2019   HCT 43.4 10/23/2019   MCV 73 (L) 10/23/2019   PLT 288 10/23/2019   Lab Results  Component Value Date   IRON 75 10/23/2019   TIBC 389 10/23/2019   FERRITIN 13 (L) 10/23/2019   Attestation Statements:   Reviewed by clinician on day of visit: allergies, medications, problem list, medical history, surgical history, family history, social history, and previous encounter notes.   I, Trixie Dredge, am acting as transcriptionist for Dennard Nip, MD.  I have reviewed the above documentation for accuracy and completeness, and I agree with the above. -  Dennard Nip, MD

## 2020-08-06 ENCOUNTER — Ambulatory Visit (INDEPENDENT_AMBULATORY_CARE_PROVIDER_SITE_OTHER): Payer: BC Managed Care – PPO | Admitting: Family Medicine

## 2020-08-06 ENCOUNTER — Other Ambulatory Visit: Payer: Self-pay

## 2020-08-06 ENCOUNTER — Encounter (INDEPENDENT_AMBULATORY_CARE_PROVIDER_SITE_OTHER): Payer: Self-pay | Admitting: Family Medicine

## 2020-08-06 VITALS — BP 109/73 | HR 68 | Temp 98.7°F | Ht 64.0 in | Wt 160.0 lb

## 2020-08-06 DIAGNOSIS — Z6830 Body mass index (BMI) 30.0-30.9, adult: Secondary | ICD-10-CM

## 2020-08-06 DIAGNOSIS — Z9189 Other specified personal risk factors, not elsewhere classified: Secondary | ICD-10-CM | POA: Diagnosis not present

## 2020-08-06 DIAGNOSIS — E669 Obesity, unspecified: Secondary | ICD-10-CM | POA: Diagnosis not present

## 2020-08-06 DIAGNOSIS — E8881 Metabolic syndrome: Secondary | ICD-10-CM | POA: Diagnosis not present

## 2020-08-06 DIAGNOSIS — E559 Vitamin D deficiency, unspecified: Secondary | ICD-10-CM | POA: Diagnosis not present

## 2020-08-06 DIAGNOSIS — E786 Lipoprotein deficiency: Secondary | ICD-10-CM | POA: Diagnosis not present

## 2020-08-06 MED ORDER — VITAMIN D (ERGOCALCIFEROL) 1.25 MG (50000 UNIT) PO CAPS: 50000.0000 [IU] | ORAL_CAPSULE | ORAL | 0 refills | Status: DC

## 2020-08-07 LAB — COMPREHENSIVE METABOLIC PANEL
ALT: 18 IU/L (ref 0–32)
AST: 24 IU/L (ref 0–40)
Albumin/Globulin Ratio: 1.8 (ref 1.2–2.2)
Albumin: 4.5 g/dL (ref 3.8–4.8)
Alkaline Phosphatase: 62 IU/L (ref 44–121)
BUN/Creatinine Ratio: 11 (ref 9–23)
BUN: 10 mg/dL (ref 6–24)
Bilirubin Total: 0.4 mg/dL (ref 0.0–1.2)
CO2: 24 mmol/L (ref 20–29)
Calcium: 9.3 mg/dL (ref 8.7–10.2)
Chloride: 105 mmol/L (ref 96–106)
Creatinine, Ser: 0.91 mg/dL (ref 0.57–1.00)
GFR calc Af Amer: 85 mL/min/{1.73_m2} (ref >59)
GFR calc non Af Amer: 74 mL/min/{1.73_m2} (ref >59)
Globulin, Total: 2.5 g/dL (ref 1.5–4.5)
Glucose: 94 mg/dL (ref 65–99)
Potassium: 4.1 mmol/L (ref 3.5–5.2)
Sodium: 142 mmol/L (ref 134–144)
Total Protein: 7 g/dL (ref 6.0–8.5)

## 2020-08-07 LAB — VITAMIN D 25 HYDROXY (VIT D DEFICIENCY, FRACTURES): Vit D, 25-Hydroxy: 40.1 ng/mL (ref 30.0–100.0)

## 2020-08-07 LAB — LIPID PANEL WITH LDL/HDL RATIO
Cholesterol, Total: 148 mg/dL (ref 100–199)
HDL: 43 mg/dL (ref >39)
LDL Chol Calc (NIH): 89 mg/dL (ref 0–99)
LDL/HDL Ratio: 2.1 ratio (ref 0.0–3.2)
Triglycerides: 84 mg/dL (ref 0–149)
VLDL Cholesterol Cal: 16 mg/dL (ref 5–40)

## 2020-08-07 LAB — HEMOGLOBIN A1C
Est. average glucose Bld gHb Est-mCnc: 114 mg/dL
Hgb A1c MFr Bld: 5.6 % (ref 4.8–5.6)

## 2020-08-07 LAB — INSULIN, RANDOM: INSULIN: 4 u[IU]/mL (ref 2.6–24.9)

## 2020-08-07 NOTE — Progress Notes (Signed)
Chief Complaint:   OBESITY Jamie Barajas is here to discuss her progress with her obesity treatment plan along with follow-up of her obesity related diagnoses. Jamie Barajas is on keeping a food journal and adhering to recommended goals of 1200-1400 calories and 90+ grams of protein daily and states she is following her eating plan approximately 80% of the time. Jamie Barajas states she is doing cardio and strengthening for 60 minutes 4 times per week.  Today's visit was #: 16 Starting weight: 186 lbs Starting date: 07/03/2019 Today's weight: 160 lbs Today's date: 08/06/2020 Total lbs lost to date: 26 Total lbs lost since last in-office visit: 0  Interim History: Jamie Barajas is doing well avoiding weight gain in December. She did some celebration eating, and etra snacking but was mindful. She has increased exercise in the meanwhile.  Subjective:   1. Vitamin D deficiency Jamie Barajas is stable on Vit D, and she due to have labs checked.  2. Low HDL (under 40) Jamie Barajas is working on diet and exercise, and she is due to have labs checked to monitor her progress.  3. Insulin resistance Jamie Barajas is working on diet and exercise, and she is due to have labs checked. She denies signs of hypoglycemia.  4. At risk for heart disease Jamie Barajas is at a higher than average risk for cardiovascular disease due to obesity.   Assessment/Plan:   1. Vitamin D deficiency Low Vitamin D level contributes to fatigue and are associated with obesity, breast, and colon cancer. We will check labs today, and we will refill prescription Vitamin D for 1 month. Beverely will follow-up for routine testing of Vitamin D, at least 2-3 times per year to avoid over-replacement.  - VITAMIN D 25 Hydroxy (Vit-D Deficiency, Fractures) - Vitamin D, Ergocalciferol, (DRISDOL) 1.25 MG (50000 UNIT) CAPS capsule; Take 1 capsule (50,000 Units total) by mouth every 7 (seven) days.  Dispense: 4 capsule; Refill: 0  2. Low HDL (under 40) Cardiovascular risk and specific  lipid/LDL goals reviewed. We discussed several lifestyle modifications today. Saquoia will continue to work on diet, exercise and weight loss efforts. We will check labs today. Orders and follow up as documented in patient record.   - Lipid Panel With LDL/HDL Ratio  3. Insulin resistance Jamie Barajas will continue to work on weight loss, exercise, and decreasing simple carbohydrates to help decrease the risk of diabetes. We will check labs today. Jamie Barajas agreed to follow-up with [place] as directed to closely monitor her progress.  - Comprehensive metabolic panel - Insulin, random - Hemoglobin A1c  4. At risk for heart disease Jamie Barajas was given approximately 15 minutes of coronary artery disease prevention counseling today. She is 49 y.o. female and has risk factors for heart disease including obesity. We discussed intensive lifestyle modifications today with an emphasis on specific weight loss instructions and strategies.   Repetitive spaced learning was employed today to elicit superior memory formation and behavioral change.  5. Class 1 obesity with serious comorbidity and body mass index (BMI) of 30.0 to 30.9 in adult, unspecified obesity type Jamie Barajas is currently in the action stage of change. As such, her goal is to continue with weight loss efforts. She has agreed to the Category 1 Plan.   Exercise goals: Increase strengthening exercise to 2 times per week.  Behavioral modification strategies: meal planning and cooking strategies.  Jamie Barajas has agreed to follow-up with our clinic in 3 weeks. She was informed of the importance of frequent follow-up visits to maximize her success with intensive  lifestyle modifications for her multiple health conditions.   Jamie Barajas was informed we would discuss her lab results at her next visit unless there is a critical issue that needs to be addressed sooner. Jamie Barajas agreed to keep her next visit at the agreed upon time to discuss these results.  Objective:   Blood pressure  109/73, pulse 68, temperature 98.7 F (37.1 C), height 5\' 4"  (1.626 m), weight 160 lb (72.6 kg), SpO2 99 %. Body mass index is 27.46 kg/m.  General: Cooperative, alert, well developed, in no acute distress. HEENT: Conjunctivae and lids unremarkable. Cardiovascular: Regular rhythm.  Lungs: Normal work of breathing. Neurologic: No focal deficits.   Lab Results  Component Value Date   CREATININE 0.91 08/06/2020   BUN 10 08/06/2020   NA 142 08/06/2020   K 4.1 08/06/2020   CL 105 08/06/2020   CO2 24 08/06/2020   Lab Results  Component Value Date   ALT 18 08/06/2020   AST 24 08/06/2020   ALKPHOS 62 08/06/2020   BILITOT 0.4 08/06/2020   Lab Results  Component Value Date   HGBA1C 5.6 08/06/2020   HGBA1C 5.5 07/03/2019   Lab Results  Component Value Date   INSULIN 4.0 08/06/2020   INSULIN 5.9 10/23/2019   INSULIN 12.2 07/03/2019   Lab Results  Component Value Date   TSH 0.919 10/23/2019   Lab Results  Component Value Date   CHOL 148 08/06/2020   HDL 43 08/06/2020   LDLCALC 89 08/06/2020   TRIG 84 08/06/2020   CHOLHDL 4.3 12/16/2011   Lab Results  Component Value Date   WBC 5.6 10/23/2019   HGB 13.7 10/23/2019   HCT 43.4 10/23/2019   MCV 73 (L) 10/23/2019   PLT 288 10/23/2019   Lab Results  Component Value Date   IRON 75 10/23/2019   TIBC 389 10/23/2019   FERRITIN 13 (L) 10/23/2019   Attestation Statements:   Reviewed by clinician on day of visit: allergies, medications, problem list, medical history, surgical history, family history, social history, and previous encounter notes.   I, Trixie Dredge, am acting as transcriptionist for Dennard Nip, MD.  I have reviewed the above documentation for accuracy and completeness, and I agree with the above. -  Dennard Nip, MD

## 2020-09-01 ENCOUNTER — Encounter (INDEPENDENT_AMBULATORY_CARE_PROVIDER_SITE_OTHER): Payer: Self-pay | Admitting: Family Medicine

## 2020-09-02 ENCOUNTER — Other Ambulatory Visit: Payer: Self-pay

## 2020-09-02 ENCOUNTER — Telehealth (INDEPENDENT_AMBULATORY_CARE_PROVIDER_SITE_OTHER): Payer: BC Managed Care – PPO | Admitting: Family Medicine

## 2020-09-02 ENCOUNTER — Encounter (INDEPENDENT_AMBULATORY_CARE_PROVIDER_SITE_OTHER): Payer: Self-pay | Admitting: Family Medicine

## 2020-09-02 DIAGNOSIS — R030 Elevated blood-pressure reading, without diagnosis of hypertension: Secondary | ICD-10-CM | POA: Diagnosis not present

## 2020-09-02 DIAGNOSIS — Z9189 Other specified personal risk factors, not elsewhere classified: Secondary | ICD-10-CM | POA: Diagnosis not present

## 2020-09-02 DIAGNOSIS — Z6830 Body mass index (BMI) 30.0-30.9, adult: Secondary | ICD-10-CM | POA: Diagnosis not present

## 2020-09-02 DIAGNOSIS — E669 Obesity, unspecified: Secondary | ICD-10-CM | POA: Diagnosis not present

## 2020-09-02 DIAGNOSIS — H209 Unspecified iridocyclitis: Secondary | ICD-10-CM

## 2020-09-02 NOTE — Telephone Encounter (Signed)
Last OV with Dr. Beasley 

## 2020-09-03 ENCOUNTER — Ambulatory Visit (INDEPENDENT_AMBULATORY_CARE_PROVIDER_SITE_OTHER): Payer: BC Managed Care – PPO | Admitting: Family Medicine

## 2020-09-04 NOTE — Progress Notes (Signed)
TeleHealth Visit:  Due to the COVID-19 pandemic, this visit was completed with telemedicine (audio/video) technology to reduce patient and provider exposure as well as to preserve personal protective equipment.   Donnella has verbally consented to this TeleHealth visit. The patient is located at home, the provider is located at the [facility] office. The participants in this visit include the listed provider and patient. The visit was conducted today via MyChart video.   Chief Complaint: OBESITY Jesi is here to discuss her progress with her obesity treatment plan along with follow-up of her obesity related diagnoses. Jordyne is on the Category 1 Plan and states she is following her eating plan approximately 0% of the time. Yvett states she is doing 0 minutes 0 times per week.  Today's visit was #: 17 Starting weight: 186 lbs Starting date: 07/03/2019  Interim History: Latiesha has been recovering from bronchitis and she hasn't been able to follow her plan closely. She just started feeling better today. She has not eaten much protein, but she is eating more soup which may be contributing to fluid retention.  Subjective:   1. Uveitis Tattianna is on st drops for her eyes, but she is using them more frequently. She was referred to opthalmology previously, but she was not contacted by them. She would like another referral.   2. Elevated blood pressure reading Lakeishia's blood pressure has been increased recently while she has been sick. She also notes increase stress. She is checking her blood pressure at home, and it was 160/85 yesterday.  3. At risk for impaired metabolic function Alvaretta is at increased risk for impaired metabolic function due to decreased protein.  Assessment/Plan:   1. Uveitis We will refer to Ophthalmology, and Carmell will continue to follow up as directed.   - Ambulatory referral to Ophthalmology  2. Elevated blood pressure reading Cassi's elevated blood  pressure can be due to illness or pain. She will check her blood pressure daily and keep a log, and will follow up in 3 weeks in the office to discuss further.  3. At risk for impaired metabolic function Stephenia was given approximately 15 minutes of impaired  metabolic function prevention counseling today. We discussed intensive lifestyle modifications today with an emphasis on specific nutrition and exercise instructions and strategies.   Repetitive spaced learning was employed today to elicit superior memory formation and behavioral change.  4. Class 1 obesity with serious comorbidity and body mass index (BMI) of 30.0 to 30.9 in adult, unspecified obesity type Sativa is currently in the action stage of change. As such, her goal is to continue with weight loss efforts. She has agreed to the Category 2 Plan.   Behavioral modification strategies: increasing lean protein intake and decreasing simple carbohydrates.  Bianca has agreed to follow-up with our clinic in 3 weeks. She was informed of the importance of frequent follow-up visits to maximize her success with intensive lifestyle modifications for her multiple health conditions.  Objective:   VITALS: Per patient if applicable, see vitals. GENERAL: Alert and in no acute distress. CARDIOPULMONARY: No increased WOB. Speaking in clear sentences.  PSYCH: Pleasant and cooperative. Speech normal rate and rhythm. Affect is appropriate. Insight and judgement are appropriate. Attention is focused, linear, and appropriate.  NEURO: Oriented as arrived to appointment on time with no prompting.   Lab Results  Component Value Date   CREATININE 0.91 08/06/2020   BUN 10 08/06/2020   NA 142 08/06/2020   K 4.1 08/06/2020  CL 105 08/06/2020   CO2 24 08/06/2020   Lab Results  Component Value Date   ALT 18 08/06/2020   AST 24 08/06/2020   ALKPHOS 62 08/06/2020   BILITOT 0.4 08/06/2020   Lab Results  Component Value Date   HGBA1C 5.6 08/06/2020    HGBA1C 5.5 07/03/2019   Lab Results  Component Value Date   INSULIN 4.0 08/06/2020   INSULIN 5.9 10/23/2019   INSULIN 12.2 07/03/2019   Lab Results  Component Value Date   TSH 0.919 10/23/2019   Lab Results  Component Value Date   CHOL 148 08/06/2020   HDL 43 08/06/2020   LDLCALC 89 08/06/2020   TRIG 84 08/06/2020   CHOLHDL 4.3 12/16/2011   Lab Results  Component Value Date   WBC 5.6 10/23/2019   HGB 13.7 10/23/2019   HCT 43.4 10/23/2019   MCV 73 (L) 10/23/2019   PLT 288 10/23/2019   Lab Results  Component Value Date   IRON 75 10/23/2019   TIBC 389 10/23/2019   FERRITIN 13 (L) 10/23/2019    Attestation Statements:   Reviewed by clinician on day of visit: allergies, medications, problem list, medical history, surgical history, family history, social history, and previous encounter notes.   I, Trixie Dredge, am acting as transcriptionist for Dennard Nip, MD.  I have reviewed the above documentation for accuracy and completeness, and I agree with the above. - Dennard Nip, MD

## 2020-09-17 DIAGNOSIS — R002 Palpitations: Secondary | ICD-10-CM | POA: Insufficient documentation

## 2020-09-17 DIAGNOSIS — U071 COVID-19: Secondary | ICD-10-CM | POA: Insufficient documentation

## 2020-09-25 ENCOUNTER — Encounter (INDEPENDENT_AMBULATORY_CARE_PROVIDER_SITE_OTHER): Payer: Self-pay | Admitting: Family Medicine

## 2020-09-25 ENCOUNTER — Telehealth (INDEPENDENT_AMBULATORY_CARE_PROVIDER_SITE_OTHER): Payer: BC Managed Care – PPO | Admitting: Family Medicine

## 2020-09-25 ENCOUNTER — Other Ambulatory Visit: Payer: Self-pay

## 2020-09-25 DIAGNOSIS — E559 Vitamin D deficiency, unspecified: Secondary | ICD-10-CM | POA: Diagnosis not present

## 2020-09-25 DIAGNOSIS — I10 Essential (primary) hypertension: Secondary | ICD-10-CM

## 2020-09-25 DIAGNOSIS — U071 COVID-19: Secondary | ICD-10-CM | POA: Diagnosis not present

## 2020-09-25 DIAGNOSIS — Z9189 Other specified personal risk factors, not elsewhere classified: Secondary | ICD-10-CM

## 2020-09-25 DIAGNOSIS — E669 Obesity, unspecified: Secondary | ICD-10-CM

## 2020-09-25 DIAGNOSIS — Z6830 Body mass index (BMI) 30.0-30.9, adult: Secondary | ICD-10-CM

## 2020-09-26 ENCOUNTER — Encounter (INDEPENDENT_AMBULATORY_CARE_PROVIDER_SITE_OTHER): Payer: Self-pay | Admitting: Family Medicine

## 2020-09-26 MED ORDER — VITAMIN D (ERGOCALCIFEROL) 1.25 MG (50000 UNIT) PO CAPS: 50000.0000 [IU] | ORAL_CAPSULE | ORAL | 0 refills | Status: DC

## 2020-09-30 NOTE — Progress Notes (Signed)
TeleHealth Visit:  Due to the COVID-19 pandemic, this visit was completed with telemedicine (audio/video) technology to reduce patient and provider exposure as well as to preserve personal protective equipment.   Jamie Barajas has verbally consented to this TeleHealth visit. The patient is located at home, the provider is located at the [facility] office. The participants in this visit include the listed provider and patient and. The visit was conducted today via [place].  OBESITY Jamie Barajas is here to discuss her progress with her obesity treatment plan along with follow-up of her obesity related diagnoses.   Today's visit was #: 18 Starting weight: 186 lbs Starting date: 07/03/2019 Today's weight: Patient reports a weight of 158 lbs Today's date: 09/25/2020  Interim History: Jamie Barajas is positive for COVID, so she is doing a video visit today.  She says she is feeling a little bit better. Nutrition Plan: Category 2 Plan for 0% of the time. Activity: None at this time.  Assessment/Plan:   1. Vitamin D deficiency Improving, but not optimized. Current vitamin D is 40.1, tested on 08/06/2020. Optimal goal > 50 ng/dL.   Plan: Continue to take prescription Vitamin D @50 ,000 IU every week as prescribed.  Follow-up for routine testing of Vitamin D, at least 2-3 times per year to avoid over-replacement.  - Refill Vitamin D, Ergocalciferol, (DRISDOL) 1.25 MG (50000 UNIT) CAPS capsule; Take 1 capsule (50,000 Units total) by mouth every 7 (seven) days.  Dispense: 4 capsule; Refill: 0  2. Essential hypertension At goal. Medications: Norvasc 10 mg daily, metoprolol 25 mg daily.   Plan: Avoid buying foods that are: processed, frozen, or prepackaged to avoid excess salt. We will watch for signs of hypotension as she continues lifestyle modifications. We will continue to monitor closely alongside her PCP and/or Specialist.  Regular follow up with PCP and specialists was also encouraged.   BP  Readings from Last 3 Encounters:  08/06/20 109/73  07/09/20 (!) 147/78  06/12/20 (!) 152/78   Lab Results  Component Value Date   CREATININE 0.91 08/06/2020   3. 2019 novel coronavirus disease (COVID-19) We will continue to monitor.   4. Class 1 obesity with serious comorbidity and body mass index (BMI) of 30.0 to 30.9 in adult, unspecified obesity type  Course: Jamie Barajas is currently in the action stage of change. As such, her goal is to continue with weight loss efforts.   Nutrition goals: She has agreed to practicing portion control and making smarter food choices, such as increasing vegetables and decreasing simple carbohydrates as tolerated.  Stay hydrated.   Exercise goals: As tolerated.  Behavioral modification strategies: increasing lean protein intake, decreasing simple carbohydrates, increasing vegetables and increasing water intake.  Jamie Barajas has agreed to follow-up with our clinic in 4 weeks. She was informed of the importance of frequent follow-up visits to maximize her success with intensive lifestyle modifications for her multiple health conditions.   Objective:   There were no vitals taken for this visit. There is no height or weight on file to calculate BMI.  General: Cooperative, alert, well developed, in no acute distress. HEENT: Conjunctivae and lids unremarkable. Cardiovascular: Regular rhythm.  Lungs: Normal work of breathing. Neurologic: No focal deficits.   Lab Results  Component Value Date   CREATININE 0.91 08/06/2020   BUN 10 08/06/2020   NA 142 08/06/2020   K 4.1 08/06/2020   CL 105 08/06/2020   CO2 24 08/06/2020   Lab Results  Component Value Date   ALT 18  08/06/2020   AST 24 08/06/2020   ALKPHOS 62 08/06/2020   BILITOT 0.4 08/06/2020   Lab Results  Component Value Date   HGBA1C 5.6 08/06/2020   HGBA1C 5.5 07/03/2019   Lab Results  Component Value Date   INSULIN 4.0 08/06/2020   INSULIN 5.9 10/23/2019   INSULIN 12.2 07/03/2019   Lab  Results  Component Value Date   TSH 0.919 10/23/2019   Lab Results  Component Value Date   CHOL 148 08/06/2020   HDL 43 08/06/2020   LDLCALC 89 08/06/2020   TRIG 84 08/06/2020   CHOLHDL 4.3 12/16/2011   Lab Results  Component Value Date   WBC 5.6 10/23/2019   HGB 13.7 10/23/2019   HCT 43.4 10/23/2019   MCV 73 (L) 10/23/2019   PLT 288 10/23/2019   Lab Results  Component Value Date   IRON 75 10/23/2019   TIBC 389 10/23/2019   FERRITIN 13 (L) 10/23/2019   Attestation Statements:   Reviewed by clinician on day of visit: allergies, medications, problem list, medical history, surgical history, family history, social history, and previous encounter notes.  I, Water quality scientist, CMA, am acting as transcriptionist for Briscoe Deutscher, DO  I have reviewed the above documentation for accuracy and completeness, and I agree with the above. Briscoe Deutscher, DO

## 2020-10-23 ENCOUNTER — Ambulatory Visit (INDEPENDENT_AMBULATORY_CARE_PROVIDER_SITE_OTHER): Payer: Self-pay | Admitting: Family Medicine

## 2020-11-20 ENCOUNTER — Ambulatory Visit (INDEPENDENT_AMBULATORY_CARE_PROVIDER_SITE_OTHER): Payer: BC Managed Care – PPO | Admitting: Family Medicine

## 2020-11-20 ENCOUNTER — Other Ambulatory Visit: Payer: Self-pay

## 2020-11-20 ENCOUNTER — Encounter (INDEPENDENT_AMBULATORY_CARE_PROVIDER_SITE_OTHER): Payer: Self-pay | Admitting: Family Medicine

## 2020-11-20 VITALS — BP 118/78 | HR 71 | Temp 98.2°F | Ht 64.0 in | Wt 158.0 lb

## 2020-11-20 DIAGNOSIS — E669 Obesity, unspecified: Secondary | ICD-10-CM | POA: Diagnosis not present

## 2020-11-20 DIAGNOSIS — R102 Pelvic and perineal pain: Secondary | ICD-10-CM

## 2020-11-20 DIAGNOSIS — E559 Vitamin D deficiency, unspecified: Secondary | ICD-10-CM

## 2020-11-20 DIAGNOSIS — I10 Essential (primary) hypertension: Secondary | ICD-10-CM | POA: Diagnosis not present

## 2020-11-20 DIAGNOSIS — Z6832 Body mass index (BMI) 32.0-32.9, adult: Secondary | ICD-10-CM

## 2020-11-20 MED ORDER — VITAMIN D (ERGOCALCIFEROL) 1.25 MG (50000 UNIT) PO CAPS: 50000.0000 [IU] | ORAL_CAPSULE | ORAL | 0 refills | Status: DC

## 2020-11-26 NOTE — Progress Notes (Signed)
Chief Complaint:   OBESITY Jamie Barajas is here to discuss her progress with her obesity treatment plan along with follow-up of her obesity related diagnoses.   Today's visit was #: 80 Starting weight: 186 lbs Starting date: 07/03/2019 Today's weight: 158 lbs Today's date: 11/20/2020 Total lbs lost to date: 28 lbs Body mass index is 27.12 kg/m.  Total weight loss percentage to date: -15.05%  Current Meal Plan: the Category 1 Plan for 70 % of the time.  Current Exercise Plan: Strength training/cardio for 45 minutes 5 times per week.  Assessment/Plan:   1. Vitamin D deficiency Not at goal. Current vitamin D is 40.1, tested on 08/06/2020. Optimal goal > 50 ng/dL.   Plan: Continue to take prescription Vitamin D @50 ,000 IU every week as prescribed.  Follow-up for routine testing of Vitamin D, at least 2-3 times per year to avoid over-replacement.   - Refill Vitamin D, Ergocalciferol, (DRISDOL) 1.25 MG (50000 UNIT) CAPS capsule; Take 1 capsule (50,000 Units total) by mouth every 7 (seven) days.  Dispense: 4 capsule; Refill: 0  2. Pelvic pain Reviewed recent visit with PCP for this issue. Jamie Barajas has a CT tomorrow. We will continue to monitor symptoms as they relate to her weight loss journey.  3. Essential hypertension Well controlled. Medications: Amlodipine 10 mg daily, Metoprolol 25 mg daily.   Plan: Avoid buying foods that are: processed, frozen, or prepackaged to avoid excess salt. We will continue to monitor closely alongside her PCP and/or Specialist.    BP Readings from Last 3 Encounters:  11/20/20 118/78  08/06/20 109/73  07/09/20 (!) 147/78   Lab Results  Component Value Date   CREATININE 0.91 08/06/2020   4. Obesity, current BMI 27.2  Course: Jamie Barajas is currently in the action stage of change. As such, her goal is to continue with weight loss efforts.   Nutrition goals: She has agreed to the Category 1 Plan.   Exercise goals: For substantial health benefits, adults  should do at least 150 minutes (2 hours and 30 minutes) a week of moderate-intensity, or 75 minutes (1 hour and 15 minutes) a week of vigorous-intensity aerobic physical activity, or an equivalent combination of moderate- and vigorous-intensity aerobic activity. Aerobic activity should be performed in episodes of at least 10 minutes, and preferably, it should be spread throughout the week.  Behavioral modification strategies: increasing lean protein intake, decreasing simple carbohydrates, increasing vegetables and increasing water intake.  Jamie Barajas has agreed to follow-up with our clinic in 4 weeks. She was informed of the importance of frequent follow-up visits to maximize her success with intensive lifestyle modifications for her multiple health conditions.   Objective:   Blood pressure 118/78, pulse 71, temperature 98.2 F (36.8 C), temperature source Oral, height 5\' 4"  (1.626 m), weight 158 lb (71.7 kg), SpO2 100 %. Body mass index is 27.12 kg/m.  General: Cooperative, alert, well developed, in no acute distress. HEENT: Conjunctivae and lids unremarkable. Cardiovascular: Regular rhythm.  Lungs: Normal work of breathing. Neurologic: No focal deficits.   Lab Results  Component Value Date   CREATININE 0.91 08/06/2020   BUN 10 08/06/2020   NA 142 08/06/2020   K 4.1 08/06/2020   CL 105 08/06/2020   CO2 24 08/06/2020   Lab Results  Component Value Date   ALT 18 08/06/2020   AST 24 08/06/2020   ALKPHOS 62 08/06/2020   BILITOT 0.4 08/06/2020   Lab Results  Component Value Date   HGBA1C 5.6 08/06/2020  HGBA1C 5.5 07/03/2019   Lab Results  Component Value Date   INSULIN 4.0 08/06/2020   INSULIN 5.9 10/23/2019   INSULIN 12.2 07/03/2019   Lab Results  Component Value Date   TSH 0.919 10/23/2019   Lab Results  Component Value Date   CHOL 148 08/06/2020   HDL 43 08/06/2020   LDLCALC 89 08/06/2020   TRIG 84 08/06/2020   CHOLHDL 4.3 12/16/2011   Lab Results  Component  Value Date   WBC 5.6 10/23/2019   HGB 13.7 10/23/2019   HCT 43.4 10/23/2019   MCV 73 (L) 10/23/2019   PLT 288 10/23/2019   Lab Results  Component Value Date   IRON 75 10/23/2019   TIBC 389 10/23/2019   FERRITIN 13 (L) 10/23/2019   Attestation Statements:   Reviewed by clinician on day of visit: allergies, medications, problem list, medical history, surgical history, family history, social history, and previous encounter notes.  Leodis Binet Friedenbach, CMA, am acting as Location manager for [employer], DO.  I have reviewed the above documentation for accuracy and completeness, and I agree with the above. Briscoe Deutscher, DO

## 2020-12-26 ENCOUNTER — Encounter (INDEPENDENT_AMBULATORY_CARE_PROVIDER_SITE_OTHER): Payer: Self-pay | Admitting: Family Medicine

## 2020-12-30 ENCOUNTER — Ambulatory Visit (INDEPENDENT_AMBULATORY_CARE_PROVIDER_SITE_OTHER): Payer: BC Managed Care – PPO | Admitting: Family Medicine

## 2021-01-06 DIAGNOSIS — L574 Cutis laxa senilis: Secondary | ICD-10-CM | POA: Insufficient documentation

## 2021-01-06 DIAGNOSIS — L906 Striae atrophicae: Secondary | ICD-10-CM | POA: Insufficient documentation

## 2021-01-06 DIAGNOSIS — T148XXA Other injury of unspecified body region, initial encounter: Secondary | ICD-10-CM | POA: Insufficient documentation

## 2021-01-23 ENCOUNTER — Other Ambulatory Visit: Payer: Self-pay

## 2021-01-23 ENCOUNTER — Encounter: Payer: Self-pay | Admitting: Obstetrics and Gynecology

## 2021-01-23 ENCOUNTER — Ambulatory Visit: Payer: BC Managed Care – PPO | Admitting: Obstetrics and Gynecology

## 2021-01-23 VITALS — BP 126/74 | HR 71 | Ht 63.75 in | Wt 161.0 lb

## 2021-01-23 DIAGNOSIS — Z862 Personal history of diseases of the blood and blood-forming organs and certain disorders involving the immune mechanism: Secondary | ICD-10-CM

## 2021-01-23 DIAGNOSIS — D259 Leiomyoma of uterus, unspecified: Secondary | ICD-10-CM | POA: Diagnosis not present

## 2021-01-23 DIAGNOSIS — N951 Menopausal and female climacteric states: Secondary | ICD-10-CM

## 2021-01-23 DIAGNOSIS — N921 Excessive and frequent menstruation with irregular cycle: Secondary | ICD-10-CM

## 2021-01-23 DIAGNOSIS — Z803 Family history of malignant neoplasm of breast: Secondary | ICD-10-CM

## 2021-01-23 NOTE — Progress Notes (Addendum)
50 y.o. G2P0 Married Black or [place] American Not Hispanic or Latino female here for evaluation of fibroids that were noted at the time of a ct scan on 11/21/20.  The CT was done secondary to pain and showed a 15 cm uterine length and fibroids (report reviewed). She has a known h/o fibroids. Previously told her fibroid was the size of a grapefruit.  Prior to 49 her cycles were monthly and heavy. Still mostly every 4 weeks, occasionally will occur in 2 weeks or 8 weeks. She will change an overnight maxi pad in up to 3 hours.  Normal TSH in 10/21 Last month Hgb was 11.5, she has a h/o sickle cell trait. She is always mildly anemic. No cramps but has occasional lower abdominal pressure. . Sexually active, no pain.  She does have frequent urination, voids 4-5 x a day, normal amounts. Drinks lots of water. Bowel movements are fine.  Has mild vasomotor symptoms. Occasional vaginal dryness.  Period Duration (Days): 7 Period Pattern: (!) Irregular Menstrual Flow: Heavy Menstrual Control: Maxi pad Menstrual Control Change Freq (Hours): 3-4 Dysmenorrhea: None Dysmenorrhea Symptoms: Headache  Patient's last menstrual period was 01/03/2021.          Sexually active: Yes.    The current method of family planning is condoms sometimes.    Exercising: Yes.     Cardio and strength training Smoker:  no  Health Maintenance: Pap:  2021 with Dr. Garwin Brothers  History of abnormal Pap:  yes unsure as to what was abnormal. H/O cryosurgery on her cervix a few years ago.  MMG:  7/21 normal  BMD:   none  Colonoscopy: none  TDaP: 02/26/2017 Gardasil: NA   reports that she has never smoked. She has never used smokeless tobacco. She reports that she does not drink alcohol and does not use drugs. She is a first [employer]. She has a 91 year son and a 32 year old daughter. Son just graduated from [employer]. Daughter will be a Equities trader at [employer].   Past Medical History:  Diagnosis Date   Abnormal EKG    Decreased anterior  R wave progression, nonspecific ST-T wave changes   Anemia    Anxiety    Atrial tachycardia (HCC)    History of atrial tachycardia prior to 2013.   Bilateral swelling of feet    Chest pain    Nuclear, Dec 29, 2011, normal, EF 70%   Fibroid    Fibroids    Hypertension    Hyperthyroidism    2013, Patient seen extensively by endocrinology with treatment given. See hospital records   Prolonged QT interval    QT interval is normal when the T wave is clear   PTSD (post-traumatic stress disorder)    Shortness of breath    Echo, May, 2013, EF 65%, no valvular abnormalities.   Sickle cell trait (HCC)    SVT (supraventricular tachycardia) ([place])    Uveitis    Vitamin B 12 deficiency    Vitamin D deficiency     Past Surgical History:  Procedure Laterality Date   cryosurgery of cervix     IR RADIOLOGIST EVAL & MGMT  03/24/2017   KNEE ARTHROSCOPY W/ INTERNAL FIXATION TIBIAL SPINE FRACTURE Right ~ 2011  She was set up for a consultation for possible uterine artery embolization. Didn't go.   Current Outpatient Medications  Medication Sig Dispense Refill   amLODipine (NORVASC) 10 MG tablet Take 10 mg by mouth daily.     cetirizine (ZYRTEC) 10  MG tablet Take 10 mg by mouth in the morning and at bedtime. Take 1 tablet by mouth twice daily for 7 days.     LORazepam (ATIVAN) 0.5 MG tablet Take 0.5 mg by mouth daily as needed. For anxiety.     metoprolol succinate (TOPROL-XL) 25 MG 24 hr tablet Take 25 mg by mouth daily.     Vitamin D, Ergocalciferol, (DRISDOL) 1.25 MG (50000 UNIT) CAPS capsule Take 1 capsule (50,000 Units total) by mouth every 7 (seven) days. 4 capsule 0   levalbuterol (XOPENEX HFA) 45 MCG/ACT inhaler Inhale 2 puffs into the lungs every 3 (three) hours as needed for wheezing. 1 Inhaler 1   mometasone (NASONEX) 50 MCG/ACT nasal spray 2 sprays daily.     No current facility-administered medications for this visit.    Family History  Problem Relation Age of Onset   Breast  cancer Mother 23   Stroke Mother    Cancer Mother    Hypertension Mother    Sarcoidosis Mother    Stroke Father    Hypertension Father    Hyperlipidemia Father    Breast cancer Sister 29    Review of Systems  All other systems reviewed and are negative.  Exam:   BP 126/74   Pulse 71   Ht 5' 3.75" (1.619 m)   Wt 161 lb (73 kg)   LMP 01/03/2021   SpO2 98%   BMI 27.85 kg/m   Weight change: @WEIGHTCHANGE @ Height:   Height: 5' 3.75" (161.9 cm)  Ht Readings from Last 3 Encounters:  01/23/21 5' 3.75" (1.619 m)  11/20/20 5\' 4"  (1.626 m)  08/06/20 5\' 4"  (1.626 m)    General appearance: alert, cooperative and appears stated age Head: Normocephalic, without obvious abnormality, atraumatic Neck: no adenopathy, supple, symmetrical, trachea midline and thyroid normal to inspection and palpation Abdomen: soft, non-tender; non distended,  uterus palpated in her lower abdomen to just under the umbilicus Extremities: extremities normal, atraumatic, no cyanosis or edema Skin: Skin color, texture, turgor normal. No rashes or lesions Lymph nodes: Cervical, supraclavicular, and axillary nodes normal. No abnormal inguinal nodes palpated Neurologic: Grossly normal   Pelvic: External genitalia:  no lesions              Urethra:  normal appearing urethra with no masses, tenderness or lesions              Bartholins and Skenes: normal                 Vagina: normal appearing vagina with normal color and discharge, no lesions              Cervix: no lesions               Bimanual Exam:  Uterus:   anteverted, mobile uterus, ~16-18 week sized, not tender              Adnexa: no mass, fullness, tenderness                 Gae Dry chaperoned for the exam.  1. Uterine leiomyoma, unspecified location This is not a new diagnosis for her, unsure if the fibroids have grown. She has mild bulk symptoms and has heavy cycles. She is starting to have irregular menses c/w perimenopause. We discussed  that when she is menopausal the fibroids will shrink.  -Will get a copy of her GYN records -Unclear when she is due for an annual exam and pap -We discussed treatment options,  including: do nothing, POP, daily progesterone, lysteda (for 5 days a month), Oriahnn (for up to 2 years), uterine artery embolization and hysterectomy.  -Ultrasound ablation is another option that wasn't reviewed.  -She would like to try the [facility], no contraindications, discussed risks, will work on approval. Limit use to 2 years  2. Menorrhagia with irregular cycle See above  3. History of anemia Will place referral to Hematology, she needs iron studies, possible iron transfusion  4. Perimenopause Discussed perimenopause Call with skipped cycles or any concerns Information given   Over 45 minutes was spent in total patient care, including chart review, history, physical, discussion of management options and documentation.   02/09/21 Addendum: Records reviewed back to 2008, no mention of abnormal paps. Prior pap listed in 2005. Cryosurgery of the cervix not noted in the notes.  Last annual exam was on 02/01/20, uterus was 16-17 week sized.  Last pap 02/01/20 negative, negative hpv; prior pap 03/17/19 normal with negative hpv 03/17/19 Endometrial biopsy: benign secretory endometrium with heavy progestin effect.  01/31/19 Ultrasound: Uterus 14 x 11.7 x 10.6 cm. Fibroids: 10.7, 4.3 and 3.4 cm H/O vit d def

## 2021-01-23 NOTE — Patient Instructions (Addendum)
Uterine Fibroids  Uterine fibroids, also called leiomyomas, are noncancerous (benign) tumors that can grow in the uterus. They can cause heavy menstrual bleeding and pain. Fibroids may also grow in the fallopian tubes, cervix, or tissues (ligaments) near the uterus. You may have one or many fibroids. Fibroids vary in size, weight, and where they grow in the uterus. Some can become quite large. Most fibroids do not require medical treatment. What are the causes? The cause of this condition is not known. What increases the risk? You are more likely to develop this condition if you: Are in your 30s or 40s and have not gone through menopause. Have a family history of this condition. Are of African American descent. Started your menstrual period at age 56 or younger. Have never given birth. Are overweight or obese. What are the signs or symptoms? Many women do not have any symptoms. Symptoms of this condition may include: Heavy menstrual bleeding. Bleeding between menstrual periods. Pain and pressure in the pelvic area, between your hip bones. Pain during sex. Bladder problems, such as needing to urinate right away or more often than usual. Inability to have children (infertility). Failure to carry pregnancy to term (miscarriage). How is this diagnosed? This condition may be diagnosed based on: Your symptoms and medical history. A physical exam. A pelvic exam that includes feeling for any tumors. Imaging tests, such as ultrasound or MRI. How is this treated? Treatment for this condition may include follow-up visits with your health care provider to monitor your fibroids for any changes. Other treatment may include: Medicines, such as: Medicines to relieve pain, including aspirin and NSAIDs, such as ibuprofen or naproxen. Hormone therapy. Treatment may be given as a pill or an injection, or it may be inserted into the uterus using an intrauterine device (IUD). Surgery that would do one of  the following: Remove the fibroids (myomectomy). This may be recommended if fibroids affect your fertility and you want to become pregnant. Remove the uterus (hysterectomy). Block the blood supply to the fibroids (uterine artery embolization). This can cause them to shrink and die. Follow these instructions at home: Medicines Take over-the-counter and prescription medicines only as told by your health care provider. Ask your health care provider if you should take iron pills or eat more iron-rich foods, such as dark green, leafy vegetables. Heavy menstrual bleeding can cause low iron levels. Managing pain If directed, apply heat to your back or abdomen to reduce pain. Use the heat source that your health care provider recommends, such as a moist heat pack or a heating pad. To apply heat: Place a towel between your skin and the heat source. Leave the heat on for 20-30 minutes. Remove the heat if your skin turns bright red. This is especially important if you are unable to feel pain, heat, or cold. You may have a greater risk of getting burned.   General instructions Pay close attention to your menstrual cycle. Tell your health care provider about any changes, such as: Heavier bleeding that requires you to change your pads or tampons more than usual. A change in the number of days that your menstrual period lasts. A change in symptoms that come with your menstrual period, such as back pain or cramps in your abdomen. Keep all follow-up visits. This is important, especially if your fibroids need to be monitored for any changes. Contact a health care provider if you: Have pelvic pain, back pain, or cramps in your abdomen that do not get better  with medicine or heat. Develop new bleeding between menstrual periods. Have increased bleeding during or between menstrual periods. Feel more tired or weak than usual. Feel light-headed. Get help right away if you: Faint. Have pelvic pain that suddenly  gets worse. Have severe vaginal bleeding that soaks a tampon or pad in 30 minutes or less. Summary Uterine fibroids are noncancerous (benign) tumors that can develop in the uterus. The exact cause of this condition is not known. Most fibroids do not require medical treatment unless they affect your ability to have children (fertility). Contact a health care provider if you have pelvic pain, back pain, or cramps in your abdomen that do not get better with medicines. Get help right away if you faint, have pelvic pain that suddenly gets worse, or have severe vaginal bleeding. This information is not intended to replace advice given to you by your health care provider. Make sure you discuss any questions you have with your health care provider. Document Revised: 03/05/2020 Document Reviewed: 03/05/2020 Elsevier Patient Education  2021 [place] [employer] [phone] ed., pp. [phone]). [place], [place]: Elsevier.">  Perimenopause Perimenopause is the normal time of a woman's life when the levels of estrogen, the female hormone produced by the ovaries, begin to decrease. This leads to changes in menstrual periods before they stop completely (menopause). Perimenopause can begin 2-8 years before menopause. During perimenopause, the ovaries may or may not produce an egg and a woman can still become pregnant. What are the causes? This condition is caused by a natural change in hormone levels that happens as you get older. What increases the risk? This condition is more likely to start at an earlier age if you have certain medical conditions or have undergone treatments, including: A tumor of the pituitary gland in the brain. A disease that affects the ovaries and hormone production. Certain cancer treatments, such as chemotherapy or hormone therapy, or radiation therapy on the pelvis. Heavy smoking and excessive alcohol use. Family history of early menopause. What are the signs  or symptoms? Perimenopausal changes affect each woman differently. Symptoms of this condition may include: Hot flashes. Irregular menstrual periods. Night sweats. Changes in feelings about sex. This could be a decrease in sex drive or an increased discomfort around your sexuality. Vaginal dryness. Headaches. Mood swings. Depression. Problems sleeping (insomnia). Memory problems or trouble concentrating. Irritability. Tiredness. Weight gain. Anxiety. Trouble getting pregnant. How is this diagnosed? This condition is diagnosed based on your medical history, a physical exam, your age, your menstrual history, and your symptoms. Hormone tests may also be done. How is this treated? In some cases, no treatment is needed. You and your health care provider should make a decision together about whether treatment is necessary. Treatment will be based on your individual condition and preferences. Various treatments are available, such as: Menopausal hormone therapy (MHT). Medicines to treat specific symptoms. Acupuncture. Vitamin or herbal supplements. Before starting treatment, make sure to let your health care provider know if you have a personal or family history of: Heart disease. Breast cancer. Blood clots. Diabetes. Osteoporosis. Follow these instructions at home: Medicines Take over-the-counter and prescription medicines only as told by your health care provider. Take vitamin supplements only as told by your health care provider. Talk with your health care provider before starting any herbal supplements. Lifestyle Do not use any products that contain nicotine or tobacco, such as cigarettes, e-cigarettes, and chewing tobacco. If you need help quitting, ask your health care provider. Get at  least 30 minutes of physical activity on 5 or more days each week. Eat a balanced diet that includes fresh fruits and vegetables, whole grains, soybeans, eggs, lean meat, and low-fat dairy. Avoid  alcoholic and caffeinated beverages, as well as spicy foods. This may help prevent hot flashes. Get 7-8 hours of sleep each night. Dress in layers that can be removed to help you manage hot flashes. Find ways to manage stress, such as deep breathing, meditation, or journaling.   General instructions Keep track of your menstrual periods, including: When they occur. How heavy they are and how long they last. How much time passes between periods. Keep track of your symptoms, noting when they start, how often you have them, and how long they last. Use vaginal lubricants or moisturizers to help with vaginal dryness and improve comfort during sex. You can still become pregnant if you are having irregular periods. Make sure you use contraception during perimenopause if you do not want to get pregnant. Keep all follow-up visits. This is important. This includes any group therapy or counseling.   Contact a health care provider if: You have heavy vaginal bleeding or pass blood clots. Your period lasts more than 2 days longer than normal. Your periods are recurring sooner than 21 days. You bleed after having sex. You have pain during sex. Get help right away if you have: Chest pain, trouble breathing, or trouble talking. Severe depression. Pain when you urinate. Severe headaches. Vision problems. Summary Perimenopause is the time when a woman's body begins to move into menopause. This may happen naturally or as a result of other health problems or medical treatments. Perimenopause can begin 2-8 years before menopause, and it can last for several years. Perimenopausal symptoms can be managed through medicines, lifestyle changes, and complementary therapies such as acupuncture. This information is not intended to replace advice given to you by your health care provider. Make sure you discuss any questions you have with your health care provider. Document Revised: 01/18/2020 Document Reviewed:  01/18/2020 Elsevier Patient Education  [place].

## 2021-01-24 ENCOUNTER — Other Ambulatory Visit (INDEPENDENT_AMBULATORY_CARE_PROVIDER_SITE_OTHER): Payer: Self-pay | Admitting: Family Medicine

## 2021-01-24 DIAGNOSIS — E559 Vitamin D deficiency, unspecified: Secondary | ICD-10-CM

## 2021-01-27 ENCOUNTER — Telehealth: Payer: Self-pay | Admitting: *Deleted

## 2021-01-27 DIAGNOSIS — D649 Anemia, unspecified: Secondary | ICD-10-CM

## 2021-01-27 MED ORDER — ORIAHNN 300-1-0.5 & 300 MG PO CPPK
ORAL_CAPSULE | ORAL | 3 refills | Status: DC
Start: 2021-01-27 — End: 2021-02-25

## 2021-01-27 NOTE — Telephone Encounter (Signed)
-----   Message from Salvadore Dom, MD sent at 01/24/2021  4:44 PM EDT ----- It comes in a pack, one pill in the am and one in the pm, #3 month supply, 3 refills.  Thanks, Sharee Pimple ----- Message ----- From: Thamas Jaegers, RMA Sent: 01/24/2021   2:43 PM EDT To: Salvadore Dom, MD  Hi Dr.Jertson   Once the Rx is sent to the pharmacy and they run the medication with her insurance, the pharmacy will let me know if it needs prior authorization, then I can proceed with the next step. But I need a Rx before I can do anything. Can you send the Rx or tell me what the directions will be and I will send the Rx.  Thanks ! ----- Message ----- From: Enrigue Catena, RMA Sent: 01/24/2021   8:15 AM EDT To: Thamas Jaegers, RMA   ----- Message ----- From: Salvadore Dom, MD Sent: 01/23/2021   6:17 PM EDT To: Enrigue Catena, RMA  Can you please try and get this patient approved for oriahnn x 24 months. Diagnosis is fibroids, menorrhagia and anemia. Thank you! Sharee Pimple

## 2021-01-27 NOTE — Telephone Encounter (Signed)
DR Wallace 

## 2021-01-27 NOTE — Telephone Encounter (Signed)
Rx sent to pharmacy. Rx comes in 14 pack, 3 month supply with 3 refills sent.

## 2021-01-27 NOTE — Telephone Encounter (Signed)
Referral placed at [facility] they will call to schedule.

## 2021-01-27 NOTE — Telephone Encounter (Signed)
PA done via cover my meds for oriahnn, will wait for response.

## 2021-01-27 NOTE — Telephone Encounter (Signed)
-----   Message from Salvadore Dom, MD sent at 01/23/2021  6:14 PM EDT ----- Can you please set this patient up for a hematology consult for anemia. Thanks, Sharee Pimple

## 2021-01-29 NOTE — Telephone Encounter (Signed)
Medication covered by [facility] until 01/27/2022. Pharmacy informed as well.

## 2021-02-06 ENCOUNTER — Telehealth: Payer: Self-pay

## 2021-02-06 DIAGNOSIS — D219 Benign neoplasm of connective and other soft tissue, unspecified: Secondary | ICD-10-CM

## 2021-02-06 DIAGNOSIS — N920 Excessive and frequent menstruation with regular cycle: Secondary | ICD-10-CM

## 2021-02-06 NOTE — Telephone Encounter (Signed)
Please check with interventional radiology if they have a handout for uterine artery embolization to send her. I don't think we have one here, but we can print of the patient handout from up to date. Offer her a consultation with Interventional radiology.

## 2021-02-06 NOTE — Telephone Encounter (Signed)
Patient called because she thinks she would like to proceed with Uterine Artery emoblization and would like some more information on it.

## 2021-02-07 NOTE — Telephone Encounter (Signed)
Call placed to IR at [phone]. Spoke with [place]. Patient was seen for consult 03/24/2017. Never proceeded with MRI.   Can schedule consult if patient desires.   Call returned to patient. Left detailed message, ok per dpr. Advised can mail brochure or schedule consult with IR, your choice. Return call to office to advise how you would like to proceed.

## 2021-02-09 ENCOUNTER — Telehealth: Payer: Self-pay | Admitting: Obstetrics and Gynecology

## 2021-02-09 ENCOUNTER — Encounter: Payer: Self-pay | Admitting: Obstetrics and Gynecology

## 2021-02-09 DIAGNOSIS — N921 Excessive and frequent menstruation with irregular cycle: Secondary | ICD-10-CM | POA: Insufficient documentation

## 2021-02-09 DIAGNOSIS — Z803 Family history of malignant neoplasm of breast: Secondary | ICD-10-CM

## 2021-02-09 DIAGNOSIS — D259 Leiomyoma of uterus, unspecified: Secondary | ICD-10-CM

## 2021-02-09 DIAGNOSIS — Z862 Personal history of diseases of the blood and blood-forming organs and certain disorders involving the immune mechanism: Secondary | ICD-10-CM

## 2021-02-09 HISTORY — DX: Personal history of diseases of the blood and blood-forming organs and certain disorders involving the immune mechanism: Z86.2

## 2021-02-09 HISTORY — DX: Leiomyoma of uterus, unspecified: D25.9

## 2021-02-09 HISTORY — DX: Excessive and frequent menstruation with irregular cycle: N92.1

## 2021-02-09 HISTORY — DX: Family history of malignant neoplasm of breast: Z80.3

## 2021-02-09 NOTE — Telephone Encounter (Signed)
Please let the patient know that I got a copy of her old records. Her uterus doesn't feel significantly different in size.  She is due for an annual this month. She doesn't need a pap this year. Since we just did a pelvic exam, I would recommend we schedule an annual ~ 3 months after treatment (with either interventional radiology or oriahnn)

## 2021-02-10 NOTE — Telephone Encounter (Signed)
Patient scheduled on 03/10/21 with Elkin, Holli Humbles, NP

## 2021-02-11 NOTE — Telephone Encounter (Signed)
I spoke with patient and informed her. She would like to proceed.  Order placed for artery embolization and patient provided with phone number to schedule consult appointment.

## 2021-02-11 NOTE — Telephone Encounter (Signed)
I spoke with patient and informed her of Dr. Gentry Fitz message. SHe is going to call for consult with interventional radiology and will schedule appointment for AEX 3 mos after treatment.

## 2021-02-11 NOTE — Telephone Encounter (Signed)
Left message to call me.

## 2021-02-12 ENCOUNTER — Other Ambulatory Visit: Payer: Self-pay | Admitting: Interventional Radiology

## 2021-02-12 ENCOUNTER — Other Ambulatory Visit: Payer: Self-pay | Admitting: Obstetrics and Gynecology

## 2021-02-12 ENCOUNTER — Other Ambulatory Visit: Payer: Self-pay | Admitting: *Deleted

## 2021-02-12 DIAGNOSIS — D259 Leiomyoma of uterus, unspecified: Secondary | ICD-10-CM

## 2021-02-12 DIAGNOSIS — D250 Submucous leiomyoma of uterus: Secondary | ICD-10-CM

## 2021-02-12 DIAGNOSIS — I10 Essential (primary) hypertension: Secondary | ICD-10-CM

## 2021-02-20 LAB — CREATININE WITH EST GFR
Creat: 0.71 mg/dL (ref 0.50–1.05)
GFR, Est African American: 114 mL/min/{1.73_m2} (ref >=60)
GFR, Est Non African American: 99 mL/min/{1.73_m2} (ref >=60)

## 2021-02-20 LAB — BUN: BUN: 15 mg/dL (ref 7–25)

## 2021-02-22 ENCOUNTER — Ambulatory Visit (HOSPITAL_BASED_OUTPATIENT_CLINIC_OR_DEPARTMENT_OTHER)
Admission: RE | Admit: 2021-02-22 | Discharge: 2021-02-22 | Disposition: A | Discharge status: Home / Self Care | Payer: BC Managed Care – PPO | Source: Ambulatory Visit | Attending: Interventional Radiology | Admitting: Interventional Radiology

## 2021-02-22 ENCOUNTER — Other Ambulatory Visit: Payer: Self-pay

## 2021-02-22 DIAGNOSIS — D259 Leiomyoma of uterus, unspecified: Secondary | ICD-10-CM | POA: Insufficient documentation

## 2021-02-22 MED ORDER — GADOBUTROL 1 MMOL/ML IV SOLN
7.0000 mL | Freq: Once | INTRAVENOUS | Status: AC | PRN
  Administered 2021-02-22: 7 mL via INTRAVENOUS

## 2021-02-25 ENCOUNTER — Encounter (INDEPENDENT_AMBULATORY_CARE_PROVIDER_SITE_OTHER): Payer: Self-pay | Admitting: Family Medicine

## 2021-02-25 ENCOUNTER — Ambulatory Visit (INDEPENDENT_AMBULATORY_CARE_PROVIDER_SITE_OTHER): Payer: BC Managed Care – PPO | Admitting: Family Medicine

## 2021-02-25 ENCOUNTER — Other Ambulatory Visit: Payer: Self-pay

## 2021-02-25 VITALS — BP 134/86 | HR 68 | Temp 98.2°F | Ht 64.0 in | Wt 158.0 lb

## 2021-02-25 DIAGNOSIS — D259 Leiomyoma of uterus, unspecified: Secondary | ICD-10-CM | POA: Diagnosis not present

## 2021-02-25 DIAGNOSIS — E88819 Insulin resistance, unspecified: Secondary | ICD-10-CM

## 2021-02-25 DIAGNOSIS — E8881 Metabolic syndrome: Secondary | ICD-10-CM | POA: Diagnosis not present

## 2021-02-25 DIAGNOSIS — Z9189 Other specified personal risk factors, not elsewhere classified: Secondary | ICD-10-CM | POA: Diagnosis not present

## 2021-02-25 DIAGNOSIS — E559 Vitamin D deficiency, unspecified: Secondary | ICD-10-CM | POA: Diagnosis not present

## 2021-02-25 DIAGNOSIS — E669 Obesity, unspecified: Secondary | ICD-10-CM | POA: Diagnosis not present

## 2021-02-25 DIAGNOSIS — Z6832 Body mass index (BMI) 32.0-32.9, adult: Secondary | ICD-10-CM

## 2021-02-26 MED ORDER — VITAMIN D (ERGOCALCIFEROL) 1.25 MG (50000 UNIT) PO CAPS: 50000.0000 [IU] | ORAL_CAPSULE | ORAL | 0 refills | Status: DC

## 2021-03-06 ENCOUNTER — Encounter: Payer: Self-pay | Admitting: *Deleted

## 2021-03-06 ENCOUNTER — Ambulatory Visit
Admission: RE | Admit: 2021-03-06 | Discharge: 2021-03-06 | Disposition: A | Discharge status: Home / Self Care | Payer: BC Managed Care – PPO | Source: Ambulatory Visit | Attending: Obstetrics and Gynecology | Admitting: Obstetrics and Gynecology

## 2021-03-06 DIAGNOSIS — D259 Leiomyoma of uterus, unspecified: Secondary | ICD-10-CM

## 2021-03-06 HISTORY — PX: IR RADIOLOGIST EVAL & MGMT: IMG5224

## 2021-03-06 NOTE — Progress Notes (Addendum)
Patient ID: Jamie Barajas, female   DOB: [birth year], 50 y.o.   MRN: [id number]       Chief Complaint: Patient was seen in consultation today for symptomatic uterine fibroids at the request of Salvadore Dom  Referring Physician(s): Salvadore Dom  History of Present Illness: Jamie Barajas is a 50 y.o. female whom I saw initially back on 03/24/2017 for discussion of treatment options for uterine fibroids.  She ultimately deferred treatment at that time.  She is now having worsening symptoms and is reconsidering proceeding with treatment.  She is accompanied by her husband. She was diagnosed with uterine fibroids about 2016.  She's had progressive menorrhagia  over the past 2 years. She describes periods every 30 days lasting 5-7 days with 3-4 days of heavy bleeding including clots, requiring changing maxi pads every 2-3 hours. No interperiod bleeding. No previous fibroid therapies or surgery. She does describe some bulk symptoms primarily abdominal/pelvic bloating, frequent urination and occasional urgency. Only previous gynecologic infections yeast infection. Endometrial biopsy  revealed  only hyperplasia. Ultrasound 2017 revealed uterine fibroid.  CT 11/21/2020 showed enlarged myomatous uterus.  She is G2 P2, with no plans for future pregnancy.  Past Medical History:  Diagnosis Date   Abnormal EKG    Decreased anterior R wave progression, nonspecific ST-T wave changes   Anemia    Anxiety    Atrial tachycardia (HCC)    History of atrial tachycardia prior to 2013.   Bilateral swelling of feet    Chest pain    Nuclear, Dec 29, 2011, normal, EF 70%   Family history of breast cancer 02/09/2021   Fibroid    Fibroid uterus 02/09/2021   Fibroids    History of anemia 02/09/2021   Hypertension    Hyperthyroidism    2013, Patient seen extensively by endocrinology with treatment given. See hospital records   Menorrhagia with irregular cycle 02/09/2021   Prolonged QT interval     QT interval is normal when the T wave is clear   PTSD (post-traumatic stress disorder)    Shortness of breath    Echo, May, 2013, EF 65%, no valvular abnormalities.   Sickle cell trait (HCC)    SVT (supraventricular tachycardia) (HCC)    Uveitis    Vitamin B 12 deficiency    Vitamin D deficiency     Past Surgical History:  Procedure Laterality Date   cryosurgery of cervix     IR RADIOLOGIST EVAL & MGMT  03/24/2017   IR RADIOLOGIST EVAL & MGMT  03/06/2021   KNEE ARTHROSCOPY W/ INTERNAL FIXATION TIBIAL SPINE FRACTURE Right ~ 2011    Allergies: Azithromycin  Medications: Prior to Admission medications   Medication Sig Start Date End Date Taking? Authorizing Provider  amLODipine (NORVASC) 10 MG tablet Take 10 mg by mouth daily.    [provider]  cetirizine (ZYRTEC) 10 MG tablet Take 10 mg by mouth in the morning and at bedtime. Take 1 tablet by mouth twice daily for 7 days.    [provider]  levalbuterol Penne Lash HFA) 45 MCG/ACT inhaler Inhale 2 puffs into the lungs every 3 (three) hours as needed for wheezing. 12/31/11 12/30/12  Barrett, Evelene Croon, PA-C  LORazepam (ATIVAN) 0.5 MG tablet Take 0.5 mg by mouth daily as needed. For anxiety.    [provider]  metoprolol succinate (TOPROL-XL) 25 MG 24 hr tablet Take 25 mg by mouth daily.    [provider]  mometasone (NASONEX) 50 MCG/ACT nasal spray 2 sprays daily. 12/14/20  [provider]  Vitamin D, Ergocalciferol, (DRISDOL) 1.25 MG (50000 UNIT) CAPS capsule Take 1 capsule (50,000 Units total) by mouth every 7 (seven) days. 02/26/21   Briscoe Deutscher, DO     Family History  Problem Relation Age of Onset   Breast cancer Mother 38   Stroke Mother    Cancer Mother    Hypertension Mother    Sarcoidosis Mother    Stroke Father    Hypertension Father    Hyperlipidemia Father    Breast cancer Sister 62    Social History   Socioeconomic History   Marital status: Married    Spouse  name: Not on file   Number of children: 2   Years of education: Not on file   Highest education level: Not on file  Occupational History   Occupation: TEACHER    Employer: [employer]  Tobacco Use   Smoking status: Never   Smokeless tobacco: Never  Substance and Sexual Activity   Alcohol use: No    Comment: 12/15/11 "glass of wine maybe 3 times/month"   Drug use: No   Sexual activity: Yes    Birth control/protection: Condom  Other Topics Concern   Not on file  Social History Narrative   Lives with husband in [place], [place].             Social Determinants of Health   Financial Resource Strain: Not on file  Food Insecurity: Not on file  Transportation Needs: Not on file  Physical Activity: Not on file  Stress: Not on file  Social Connections: Not on file    ECOG Status: 1 - Symptomatic but completely ambulatory  Review of Systems: A 12 point ROS discussed and pertinent positives are indicated in the HPI above.  All other systems are negative.  Review of Systems  Vital Signs: BP (!) 172/94 (BP Location: Right Arm)   SpO2 98%   Physical Exam Constitutional: Oriented to person, place, and time. Well-developed and well-nourished. No distress.   HENT:  Head: Normocephalic and atraumatic.  Eyes: Conjunctivae and EOM are normal. Right eye exhibits no discharge. Left eye exhibits no discharge. No scleral icterus.  Neck: No JVD present.  Pulmonary/Chest: Effort normal. No stridor. No respiratory distress.  Abdomen: soft, non distended Neurological:  alert and oriented to person, place, and time.  Skin: Skin is warm and dry.  not diaphoretic.  Psychiatric:   normal mood and affect.   behavior is normal. Judgment and thought content normal. ]  Mallampati Score:     Imaging: MR PELVIS W WO CONTRAST  Result Date: 02/23/2021 CLINICAL DATA:  Further evaluation uterine leiomyomas. EXAM: MRI PELVIS WITHOUT AND WITH CONTRAST TECHNIQUE: Multiplanar multisequence MR  imaging of the pelvis was performed both before and after administration of intravenous contrast. CONTRAST:  37mL GADAVIST GADOBUTROL 1 MMOL/ML IV SOLN COMPARISON:  CT November 21, 2020 FINDINGS: Urinary Tract: Unremarkable urinary bladder. Bowel: Unremarkable pelvic bowel loops. Vascular/Lymphatic: No pathologically enlarged pelvic lymph nodes. Prominent right pelvic vasculature enlargement of the gonadal vein, as can be seen pelvic congestion syndrome Reproductive: Uterus: Measures 17.3 x 11.1 x 10.3 cm. Numerous uterine masses largest of which is and intramural mass in the lower uterine segment which demonstrates heterogeneously low T2 signal with multiple small areas of T2 hyperintensity, with prominent postcontrast enhancement measuring 9.9 x 8.2 x 9.4 cm, which effaces and distorts the endometrium. The majority of the addition masses are intrinsically T2 hypointense in T1 isointense to mildly hyperintense and enhance  to a varying degree postcontrast administration. There is a single T2 hyperintense 5.1 x 4.6 5.3 cm mass in the uterine fundus which demonstrated low level slightly heterogeneous contrast enhancement, most consistent with a leiomyoma undergoing midfoot degeneration. There are multiple nabothian cysts including a 1 cm cyst on image 28/4 which demonstrates lower T2 signal slightly increased T1 signal but without postcontrast enhancement, most consistent with a complex cyst. Right ovary: Appears normal. No mass identified. Left ovary:  Appears normal. No mass identified. Other: No abdominopelvic ascites. Musculoskeletal:  Unremarkable. IMPRESSION: 1. Enlarged leiomyomatous uterus, some of which demonstrate varying degrees of degeneration, further described above. 2. Prominent right pelvic vasculature, as can be seen with pelvic congestion syndrome. Electronically Signed   By: Dahlia Bailiff MD   On: 02/23/2021 22:06   IR Radiologist Eval & Mgmt  Result Date: 03/06/2021 Please refer to notes tab for  details about interventional procedure. (Op Note)   Labs:  CBC: No results for input(s): WBC, HGB, HCT, PLT in the last 8760 hours.  COAGS: No results for input(s): INR, APTT in the last 8760 hours.  BMP: Recent Labs    08/06/20 0753 02/19/21 1119  NA 142  --   K 4.1  --   CL 105  --   CO2 24  --   GLUCOSE 94  --   BUN 10 15  CALCIUM 9.3  --   CREATININE 0.91 0.71  GFRNONAA 74 99  GFRAA 85 114    LIVER FUNCTION TESTS: Recent Labs    08/06/20 0753  BILITOT 0.4  AST 24  ALT 18  ALKPHOS 62  PROT 7.0  ALBUMIN 4.5    TUMOR MARKERS: No results for input(s): AFPTM, CEA, CA199, CHROMGRNA in the last 8760 hours.  Assessment and Plan:  My impression is that this patient's menorrhagia is  likely secondary to uterine fibroids. We spent the majority of the consultation discussing the pathophysiology of uterine leiomyomata, natural history, anticipated  involution post menopause, and treatment options. We discussed  hysterectomy, and uterine fibroid embolization.  Her uterus is probably too large for transvaginal hysterectomy.  I reviewed again the technique of UFE from radial or femoral approach, anticipated benefits, possible risks and complications including but not limited to bleeding, infection, vessel damage, nontarget embolization, and incomplete symptom relief. We discussed the 90% clinical success rate historically and at our experience with UFE for abnormal uterine bleeding.  She should also have significant improvement of her bulk symptoms.. We discussed the post procedure course and time course of symptom resolution. We discussed the need for continued gynecologic care.  She seemed to understand and did ask appropriate questions, which were answered.  Of note, her MRI demonstrates no pedunculated fibroids on a narrow stalk, or other significant unexpected pelvic pathology.  We discussed the prominent pelvic venous findings and MRI, probably related to the hyperemic large  fibroids, less likely primary pelvic congestion syndrome.  We can make sure these resolve appropriately after treatment.   After our discussion, the patient was motivated proceed. Accordingly, we can set up the UFE under moderate sedation at [facility], with overnight observation.  Thank you for this interesting consult.  I greatly enjoyed meeting Jamie Barajas and look forward to participating in their care.  A copy of this report was sent to the requesting provider on this date.  Electronically Signed: Rickard Rhymes 03/06/2021, 12:10 PM   I spent a total of    40 Minutes in face to face in  clinical consultation, greater than 50% of which was counseling/coordinating care for symptomatic uterine fibroids.

## 2021-03-07 ENCOUNTER — Other Ambulatory Visit: Payer: Self-pay | Admitting: Family

## 2021-03-07 DIAGNOSIS — D649 Anemia, unspecified: Secondary | ICD-10-CM

## 2021-03-10 ENCOUNTER — Inpatient Hospital Stay (HOSPITAL_BASED_OUTPATIENT_CLINIC_OR_DEPARTMENT_OTHER): Payer: BC Managed Care – PPO | Admitting: Family

## 2021-03-10 ENCOUNTER — Telehealth: Payer: Self-pay

## 2021-03-10 ENCOUNTER — Encounter: Payer: Self-pay | Admitting: Family

## 2021-03-10 ENCOUNTER — Other Ambulatory Visit: Payer: Self-pay

## 2021-03-10 ENCOUNTER — Inpatient Hospital Stay: Payer: BC Managed Care – PPO | Attending: Hematology & Oncology

## 2021-03-10 VITALS — BP 134/91 | HR 79 | Temp 98.8°F | Resp 18 | Ht 64.0 in | Wt 162.1 lb

## 2021-03-10 DIAGNOSIS — D500 Iron deficiency anemia secondary to blood loss (chronic): Secondary | ICD-10-CM | POA: Insufficient documentation

## 2021-03-10 DIAGNOSIS — D649 Anemia, unspecified: Secondary | ICD-10-CM

## 2021-03-10 DIAGNOSIS — Z803 Family history of malignant neoplasm of breast: Secondary | ICD-10-CM | POA: Insufficient documentation

## 2021-03-10 DIAGNOSIS — D573 Sickle-cell trait: Secondary | ICD-10-CM | POA: Insufficient documentation

## 2021-03-10 DIAGNOSIS — I10 Essential (primary) hypertension: Secondary | ICD-10-CM | POA: Diagnosis not present

## 2021-03-10 DIAGNOSIS — Z79899 Other long term (current) drug therapy: Secondary | ICD-10-CM | POA: Diagnosis not present

## 2021-03-10 DIAGNOSIS — N920 Excessive and frequent menstruation with regular cycle: Secondary | ICD-10-CM | POA: Insufficient documentation

## 2021-03-10 LAB — CBC WITH DIFFERENTIAL (CANCER CENTER ONLY)
Abs Immature Granulocytes: 0.01 10*3/uL (ref 0.00–0.07)
Basophils Absolute: 0 10*3/uL (ref 0.0–0.1)
Basophils Relative: 0 %
Eosinophils Absolute: 0 10*3/uL (ref 0.0–0.5)
Eosinophils Relative: 1 %
HCT: 36.9 % (ref 36.0–46.0)
Hemoglobin: 11.7 g/dL — ABNORMAL LOW (ref 12.0–15.0)
Immature Granulocytes: 0 %
Lymphocytes Relative: 21 %
Lymphs Abs: 1.1 10*3/uL (ref 0.7–4.0)
MCH: 20.2 pg — ABNORMAL LOW (ref 26.0–34.0)
MCHC: 31.7 g/dL (ref 30.0–36.0)
MCV: 63.7 fL — ABNORMAL LOW (ref 80.0–100.0)
Monocytes Absolute: 0.4 10*3/uL (ref 0.1–1.0)
Monocytes Relative: 9 %
Neutro Abs: 3.5 10*3/uL (ref 1.7–7.7)
Neutrophils Relative %: 69 %
Platelet Count: 230 10*3/uL (ref 150–400)
RBC: 5.79 MIL/uL — ABNORMAL HIGH (ref 3.87–5.11)
RDW: 19.3 % — ABNORMAL HIGH (ref 11.5–15.5)
WBC Count: 5.1 10*3/uL (ref 4.0–10.5)
nRBC: 0 % (ref 0.0–0.2)

## 2021-03-10 LAB — CMP (CANCER CENTER ONLY)
ALT: 24 U/L (ref 0–44)
AST: 25 U/L (ref 15–41)
Albumin: 4.1 g/dL (ref 3.5–5.0)
Alkaline Phosphatase: 56 U/L (ref 38–126)
Anion gap: 8 (ref 5–15)
BUN: 15 mg/dL (ref 6–20)
CO2: 26 mmol/L (ref 22–32)
Calcium: 9.7 mg/dL (ref 8.9–10.3)
Chloride: 103 mmol/L (ref 98–111)
Creatinine: 0.7 mg/dL (ref 0.44–1.00)
GFR, Estimated: >60 mL/min (ref >60)
Glucose, Bld: 118 mg/dL — ABNORMAL HIGH (ref 70–99)
Potassium: 3.7 mmol/L (ref 3.5–5.1)
Sodium: 137 mmol/L (ref 135–145)
Total Bilirubin: 0.7 mg/dL (ref 0.3–1.2)
Total Protein: 7 g/dL (ref 6.5–8.1)

## 2021-03-10 LAB — IRON AND TIBC
Iron: 61 ug/dL (ref 41–142)
Saturation Ratios: 15 % — ABNORMAL LOW (ref 21–57)
TIBC: 401 ug/dL (ref 236–444)
UIBC: 340 ug/dL (ref 120–384)

## 2021-03-10 LAB — LACTATE DEHYDROGENASE: LDH: 143 U/L (ref 98–192)

## 2021-03-10 LAB — RETICULOCYTES
Immature Retic Fract: 20.5 % — ABNORMAL HIGH (ref 2.3–15.9)
RBC.: 5.81 MIL/uL — ABNORMAL HIGH (ref 3.87–5.11)
Retic Count, Absolute: 69.7 10*3/uL (ref 19.0–186.0)
Retic Ct Pct: 1.2 % (ref 0.4–3.1)

## 2021-03-10 LAB — FERRITIN: Ferritin: <4 ng/mL — ABNORMAL LOW (ref 11–307)

## 2021-03-10 LAB — SAVE SMEAR(SSMR), FOR PROVIDER SLIDE REVIEW

## 2021-03-10 MED ORDER — FOLIC ACID 1 MG PO TABS: 1.0000 mg | ORAL_TABLET | Freq: Every day | ORAL | 11 refills | Status: DC

## 2021-03-10 NOTE — Telephone Encounter (Signed)
Appts made and printed for pt per 03/10/21 los  Jamie Barajas

## 2021-03-10 NOTE — Progress Notes (Signed)
Chief Complaint:   OBESITY Jamie Barajas is here to discuss her progress with her obesity treatment plan along with follow-up of her obesity related diagnoses.   Today's visit was #: 20 Starting weight: 186 lbs Starting date: 07/03/2019 Today's weight: 158 lbs Today's date: 02/25/2021 Weight change since last visit: 0 Total lbs lost to date: 28 lbs Body mass index is 27.12 kg/m.  Total weight loss percentage to date: -15.05%  Interim History:  Recent MRI.  She is waiting to hear about the results and plan. Current Meal Plan: the Category 2 Plan for 60% of the time.  Current Exercise Plan: Cardio/strength training for 30-60 minutes 5 times per week.  Assessment/Plan:   Meds ordered this encounter  Medications   Vitamin D, Ergocalciferol, (DRISDOL) 1.25 MG (50000 UNIT) CAPS capsule    Sig: Take 1 capsule (50,000 Units total) by mouth every 7 (seven) days.    Dispense:  4 capsule    Refill:  0    1. Vitamin D deficiency Not at goal.  She is taking vitamin D 50,000 IU weekly.  Plan: Continue to take prescription Vitamin D '@50'$ ,000 IU every week as prescribed.  Follow-up for routine testing of Vitamin D, at least 2-3 times per year to avoid over-replacement.  Lab Results  Component Value Date   VD25OH 40.1 08/06/2020   VD25OH 43.8 10/23/2019   VD25OH 14.0 (L) 07/03/2019   - Refill Vitamin D, Ergocalciferol, (DRISDOL) 1.25 MG (50000 UNIT) CAPS capsule; Take 1 capsule (50,000 Units total) by mouth every 7 (seven) days.  Dispense: 4 capsule; Refill: 0  2. Insulin resistance At goal. Goal is HgbA1c < 5.7, fasting insulin closer to 5.  Medication: None.    Plan:  She will continue to focus on protein-rich, low simple carbohydrate foods. We reviewed the importance of hydration, regular exercise for stress reduction, and restorative sleep.   Lab Results  Component Value Date   HGBA1C 5.6 08/06/2020   Lab Results  Component Value Date   INSULIN 4.0 08/06/2020   INSULIN 5.9  10/23/2019   INSULIN 12.2 07/03/2019   3. Uterine leiomyoma Jamie Barajas had a recent MRI and is waiting to hear the results and the plan.  4. At risk for heart disease Due to Jamie Barajas's current state of health and medical condition(s), she is at a higher risk for heart disease.  This puts the patient at much greater risk to subsequently develop cardiopulmonary conditions that can significantly affect patient's quality of life in a negative manner.    At least 8 minutes were spent on counseling Jamie Barajas about these concerns today. Evidence-based interventions for health behavior change were utilized today including the discussion of self monitoring techniques, problem-solving barriers, and SMART goal setting techniques.  Specifically, regarding patient's less desirable eating habits and patterns, we employed the technique of small changes when Jamie Barajas has not been able to fully commit to her prudent nutritional plan.  5. Obesity, current BMI 27.1  Course: Jamie Barajas is currently in the action stage of change. As such, her goal is to continue with weight loss efforts.   Nutrition goals: She has agreed to the Category 2 Plan.   Exercise goals:  As is.  Behavioral modification strategies: increasing lean protein intake, decreasing simple carbohydrates, increasing vegetables, and increasing water intake.  Jamie Barajas has agreed to follow-up with our clinic in 4 weeks. She was informed of the importance of frequent follow-up visits to maximize her success with intensive lifestyle modifications for her multiple health  conditions.   Objective:   Blood pressure 134/86, pulse 68, temperature 98.2 F (36.8 C), temperature source Oral, height '5\' 4"'$  (1.626 m), weight 158 lb (71.7 kg), SpO2 99 %. Body mass index is 27.12 kg/m.  General: Cooperative, alert, well developed, in no acute distress. HEENT: Conjunctivae and lids unremarkable. Cardiovascular: Regular rhythm.  Lungs: Normal work of breathing. Neurologic: No focal  deficits.   Lab Results  Component Value Date   CREATININE 0.70 03/10/2021   BUN 15 03/10/2021   NA 137 03/10/2021   K 3.7 03/10/2021   CL 103 03/10/2021   CO2 26 03/10/2021   Lab Results  Component Value Date   ALT 24 03/10/2021   AST 25 03/10/2021   ALKPHOS 56 03/10/2021   BILITOT 0.7 03/10/2021   Lab Results  Component Value Date   HGBA1C 5.6 08/06/2020   HGBA1C 5.5 07/03/2019   Lab Results  Component Value Date   INSULIN 4.0 08/06/2020   INSULIN 5.9 10/23/2019   INSULIN 12.2 07/03/2019   Lab Results  Component Value Date   TSH 0.919 10/23/2019   Lab Results  Component Value Date   CHOL 148 08/06/2020   HDL 43 08/06/2020   LDLCALC 89 08/06/2020   TRIG 84 08/06/2020   CHOLHDL 4.3 12/16/2011   Lab Results  Component Value Date   VD25OH 40.1 08/06/2020   VD25OH 43.8 10/23/2019   VD25OH 14.0 (L) 07/03/2019   Lab Results  Component Value Date   WBC 5.1 03/10/2021   HGB 11.7 (L) 03/10/2021   HCT 36.9 03/10/2021   MCV 63.7 (L) 03/10/2021   PLT 230 03/10/2021   Lab Results  Component Value Date   IRON 75 10/23/2019   TIBC 389 10/23/2019   FERRITIN 13 (L) 10/23/2019   Attestation Statements:   Reviewed by clinician on day of visit: allergies, medications, problem list, medical history, surgical history, family history, social history, and previous encounter notes.  I, Water quality scientist, CMA, am acting as transcriptionist for Briscoe Deutscher, DO  I have reviewed the above documentation for accuracy and completeness, and I agree with the above. Briscoe Deutscher, DO

## 2021-03-10 NOTE — Telephone Encounter (Signed)
Appts made and printed for pt  Jamie Barajas

## 2021-03-10 NOTE — Progress Notes (Signed)
Hematology/Oncology Consultation   Name: Jamie Barajas      MRN: [id number]    Location: Room/bed info not found  Date: 03/10/2021 Time:9:15 AM   REFERRING PHYSICIAN: Sumner Boast, MD  REASON FOR CONSULT: Anemia unspecified    DIAGNOSIS: Anemia, lab work pending   HISTORY OF PRESENT ILLNESS: Jamie Barajas is a very pleasant 50 yo African American female with history of anemia secondary to heavy cycles. She states that she had multiple fibroids on recent MRI and is considering uterine ablation as treatment.  Her cycle is heavy but irregular over the last year. Her cycles lasts 5-7 days at a time with occasional spotting.  No other blood loss noted. No bruising or petechiae.  She also carries the sickle cell trait. She is not taking folic acid.  He mother, sister and daughter also carry the sickle cell trait. Her niece has sickle cell disease.  She is symptomatic with fatigue, chewing lots of ice and dry lips.  She has received Iv iron in the past but is concerned about the cost. She has tolerated oral in in the past with minimal benefit.  She has 2 adult children. No history of miscarriage.  No history of diabetes.  She had a bout with hypothyroidism about 10 years ago and was previously on medication but is not currently on anything as treatment. She had developed tachycardia with this initially but since starting Toprol-XL she has not had any issues.  No fever, chills, n/v, cough, rash, dizziness, SOB, chest pain, palpitations, abdominal pain or changes in bowel or bladder habits.  No swelling, tenderness, numbness or tingling in her extremities at this time.  She has occasional puffiness in her hands which she attributes to HTN and Norvasc.  No falls or syncope to report.  She has been eating high protein and low card and is being followed by the [facility]. She is staying well hydrated. Her weight is stable at 162 lbs.  No smoking or recreational drug use.  She  has 2 glasses of wine per month.  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   ROS: All other 10 point review of systems is negative.   PAST MEDICAL HISTORY:   Past Medical History:  Diagnosis Date   Abnormal EKG    Decreased anterior R wave progression, nonspecific ST-T wave changes   Anemia    Anxiety    Atrial tachycardia (HCC)    History of atrial tachycardia prior to 2013.   Bilateral swelling of feet    Chest pain    Nuclear, Dec 29, 2011, normal, EF 70%   Family history of breast cancer 02/09/2021   Fibroid    Fibroid uterus 02/09/2021   Fibroids    History of anemia 02/09/2021   Hypertension    Hyperthyroidism    2013, Patient seen extensively by endocrinology with treatment given. See hospital records   Menorrhagia with irregular cycle 02/09/2021   Prolonged QT interval    QT interval is normal when the T wave is clear   PTSD (post-traumatic stress disorder)    Shortness of breath    Echo, May, 2013, EF 65%, no valvular abnormalities.   Sickle cell trait (HCC)    SVT (supraventricular tachycardia) (HCC)    Uveitis    Vitamin B 12 deficiency    Vitamin D deficiency     ALLERGIES: Allergies  Allergen Reactions   Azithromycin       MEDICATIONS:  Current Outpatient Medications on File Prior to Visit  Medication Sig Dispense Refill   amLODipine (NORVASC) 10 MG tablet Take 10 mg by mouth daily.     cetirizine (ZYRTEC) 10 MG tablet Take 10 mg by mouth in the morning and at bedtime. Take 1 tablet by mouth twice daily for 7 days.     levalbuterol (XOPENEX HFA) 45 MCG/ACT inhaler Inhale 2 puffs into the lungs every 3 (three) hours as needed for wheezing. 1 Inhaler 1   LORazepam (ATIVAN) 0.5 MG tablet Take 0.5 mg by mouth daily as needed. For anxiety.     metoprolol succinate (TOPROL-XL) 25 MG 24 hr tablet Take 25 mg by mouth daily.     mometasone (NASONEX) 50 MCG/ACT nasal spray 2 sprays daily.     Vitamin D, Ergocalciferol, (DRISDOL) 1.25 MG  (50000 UNIT) CAPS capsule Take 1 capsule (50,000 Units total) by mouth every 7 (seven) days. 4 capsule 0   No current facility-administered medications on file prior to visit.     PAST SURGICAL HISTORY Past Surgical History:  Procedure Laterality Date   cryosurgery of cervix     IR RADIOLOGIST EVAL & MGMT  03/24/2017   IR RADIOLOGIST EVAL & MGMT  03/06/2021   KNEE ARTHROSCOPY W/ INTERNAL FIXATION TIBIAL SPINE FRACTURE Right ~ 2011    FAMILY HISTORY: Family History  Problem Relation Age of Onset   Breast cancer Mother 25   Stroke Mother    Cancer Mother    Hypertension Mother    Sarcoidosis Mother    Stroke Father    Hypertension Father    Hyperlipidemia Father    Breast cancer Sister 56    SOCIAL HISTORY:  reports that she has never smoked. She has never used smokeless tobacco. She reports that she does not drink alcohol and does not use drugs.  PERFORMANCE STATUS: The patient's performance status is 1 - Symptomatic but completely ambulatory  PHYSICAL EXAM: Most Recent Vital Signs: There were no vitals taken for this visit. There were no vitals taken for this visit.  General Appearance:    Alert, cooperative, no distress, appears stated age  Head:    Normocephalic, without obvious abnormality, atraumatic  Eyes:    PERRL, conjunctiva/corneas clear, EOM's intact, fundi    benign, both eyes        Throat:   Lips, mucosa, and tongue normal; teeth and gums normal  Neck:   Supple, symmetrical, trachea midline, no adenopathy;    thyroid:  no enlargement/tenderness/nodules; no carotid   bruit or JVD  Back:     Symmetric, no curvature, ROM normal, no CVA tenderness  Lungs:     Clear to auscultation bilaterally, respirations unlabored  Chest Wall:    No tenderness or deformity   Heart:    Regular rate and rhythm, S1 and S2 normal, no murmur, rub   or gallop     Abdomen:     Soft, non-tender, bowel sounds active all four quadrants,    no masses, no organomegaly         Extremities:   Extremities normal, atraumatic, no cyanosis or edema  Pulses:   2+ and symmetric all extremities  Skin:   Skin color, texture, turgor normal, no rashes or lesions  Lymph nodes:   Cervical, supraclavicular, and axillary nodes normal  Neurologic:   CNII-XII intact, normal strength, sensation and reflexes    throughout    LABORATORY DATA:  Results for orders placed or performed in visit on 03/10/21 (from the  past 48 hour(s))  CBC with Differential (Cancer Center Only)     Status: Abnormal   Collection Time: 03/10/21  8:54 AM  Result Value Ref Range   WBC Count 5.1 4.0 - 10.5 K/uL   RBC 5.79 (H) 3.87 - 5.11 MIL/uL   Hemoglobin 11.7 (L) 12.0 - 15.0 g/dL   HCT 36.9 36.0 - 46.0 %   MCV 63.7 (L) 80.0 - 100.0 fL   MCH 20.2 (L) 26.0 - 34.0 pg   MCHC 31.7 30.0 - 36.0 g/dL   RDW 19.3 (H) 11.5 - 15.5 %   Platelet Count 230 150 - 400 K/uL   nRBC 0.0 0.0 - 0.2 %   Neutrophils Relative % 69 %   Neutro Abs 3.5 1.7 - 7.7 K/uL   Lymphocytes Relative 21 %   Lymphs Abs 1.1 0.7 - 4.0 K/uL   Monocytes Relative 9 %   Monocytes Absolute 0.4 0.1 - 1.0 K/uL   Eosinophils Relative 1 %   Eosinophils Absolute 0.0 0.0 - 0.5 K/uL   Basophils Relative 0 %   Basophils Absolute 0.0 0.0 - 0.1 K/uL   Immature Granulocytes 0 %   Abs Immature Granulocytes 0.01 0.00 - 0.07 K/uL    Comment: Performed at [facility] at [facility], [address], [place], [place] [address]  Save Smear [facility])     Status: None   Collection Time: 03/10/21  8:54 AM  Result Value Ref Range   Smear Review SMEAR STAINED AND AVAILABLE FOR REVIEW     Comment: Performed at [facility] at [facility], [address] [place], [place] [address]  Reticulocytes     Status: Abnormal   Collection Time: 03/10/21  8:55 AM  Result Value Ref Range   Retic Ct Pct 1.2 0.4 - 3.1 %   RBC. 5.81 (H) 3.87 - 5.11 MIL/uL   Retic Count, Absolute 69.7 19.0 - 186.0 K/uL   Immature Retic  Fract 20.5 (H) 2.3 - 15.9 %    Comment: Performed at [facility] at [facility], [address] [place], [place] [address]      RADIOGRAPHY: No results found.     PATHOLOGY: None  ASSESSMENT/PLAN: Ms. Arab is a very pleasant 50 yo African American female with history of anemia secondary to heavy cycles.  Anemia lab work up pending.  We will see if she would benefit from restarting oral or IV iron.  We will go ahead and have her start taking folic acid 1 mg PO daily.  Follow-up in 8 weeks.   All questions were answered. The patient knows to call the clinic with any problems, questions or concerns. We can certainly see the patient much sooner if necessary.  The patient was discussed with Dr. Marin Olp and he is in agreement with the aforementioned.   Laverna Peace, NP

## 2021-03-12 ENCOUNTER — Other Ambulatory Visit: Payer: Self-pay | Admitting: Family

## 2021-03-12 DIAGNOSIS — D509 Iron deficiency anemia, unspecified: Secondary | ICD-10-CM | POA: Insufficient documentation

## 2021-03-12 LAB — HGB FRACTIONATION CASCADE
Hgb A2: 3.9 % — ABNORMAL HIGH (ref 1.8–3.2)
Hgb A: 70.9 % — ABNORMAL LOW (ref 96.4–98.8)
Hgb F: 0 % (ref 0.0–2.0)
Hgb S: 25.2 % — ABNORMAL HIGH

## 2021-03-12 LAB — HGB SOLUBILITY: Hgb Solubility: POSITIVE — AB

## 2021-03-12 LAB — ERYTHROPOIETIN: Erythropoietin: 47.1 m[IU]/mL — ABNORMAL HIGH (ref 2.6–18.5)

## 2021-03-17 ENCOUNTER — Other Ambulatory Visit: Payer: Self-pay

## 2021-03-17 ENCOUNTER — Inpatient Hospital Stay: Payer: BC Managed Care – PPO | Attending: Hematology & Oncology

## 2021-03-17 VITALS — BP 122/76 | HR 74 | Temp 98.8°F | Resp 17

## 2021-03-17 DIAGNOSIS — D509 Iron deficiency anemia, unspecified: Secondary | ICD-10-CM

## 2021-03-17 DIAGNOSIS — Z803 Family history of malignant neoplasm of breast: Secondary | ICD-10-CM | POA: Insufficient documentation

## 2021-03-17 DIAGNOSIS — N920 Excessive and frequent menstruation with regular cycle: Secondary | ICD-10-CM | POA: Insufficient documentation

## 2021-03-17 DIAGNOSIS — Z79899 Other long term (current) drug therapy: Secondary | ICD-10-CM | POA: Diagnosis not present

## 2021-03-17 DIAGNOSIS — I10 Essential (primary) hypertension: Secondary | ICD-10-CM | POA: Diagnosis not present

## 2021-03-17 DIAGNOSIS — D500 Iron deficiency anemia secondary to blood loss (chronic): Secondary | ICD-10-CM | POA: Insufficient documentation

## 2021-03-17 DIAGNOSIS — D573 Sickle-cell trait: Secondary | ICD-10-CM | POA: Diagnosis not present

## 2021-03-17 MED ORDER — NA FERRIC GLUC CPLX IN SUCROSE 12.5 MG/ML IV SOLN
125.0000 mg | Freq: Once | INTRAVENOUS | Status: AC
  Administered 2021-03-17: 125 mg via INTRAVENOUS
  Filled 2021-03-17: qty 10

## 2021-03-17 MED ORDER — SODIUM CHLORIDE 0.9 % IV SOLN
Freq: Once | INTRAVENOUS | Status: AC
  Filled 2021-03-17: qty 250

## 2021-03-19 LAB — ALPHA-THALASSEMIA GENOTYPR

## 2021-03-20 ENCOUNTER — Encounter (INDEPENDENT_AMBULATORY_CARE_PROVIDER_SITE_OTHER): Payer: Self-pay | Admitting: Family Medicine

## 2021-03-20 ENCOUNTER — Encounter: Payer: Self-pay | Admitting: Family

## 2021-03-21 ENCOUNTER — Ambulatory Visit: Payer: BC Managed Care – PPO

## 2021-03-24 ENCOUNTER — Other Ambulatory Visit: Payer: Self-pay

## 2021-03-24 ENCOUNTER — Inpatient Hospital Stay: Payer: BC Managed Care – PPO

## 2021-03-24 VITALS — BP 130/89 | HR 75 | Temp 98.3°F | Resp 16

## 2021-03-24 DIAGNOSIS — D500 Iron deficiency anemia secondary to blood loss (chronic): Secondary | ICD-10-CM | POA: Diagnosis not present

## 2021-03-24 DIAGNOSIS — D509 Iron deficiency anemia, unspecified: Secondary | ICD-10-CM

## 2021-03-24 MED ORDER — NA FERRIC GLUC CPLX IN SUCROSE 12.5 MG/ML IV SOLN
125.0000 mg | Freq: Once | INTRAVENOUS | Status: AC
  Administered 2021-03-24: 125 mg via INTRAVENOUS
  Filled 2021-03-24: qty 125

## 2021-03-24 MED ORDER — SODIUM CHLORIDE 0.9 % IV SOLN
Freq: Once | INTRAVENOUS | Status: AC
  Filled 2021-03-24: qty 250

## 2021-03-24 NOTE — Patient Instructions (Signed)
[facility] [place] [place]  Discharge Instructions: Thank you for choosing [facility] to provide your oncology and hematology care.   If you have a lab appointment with the [facility], please go directly to the [facility] and check in at the registration area.  Wear comfortable clothing and clothing appropriate for easy access to any Portacath or PICC line.   We strive to give you quality time with your provider. You may need to reschedule your appointment if you arrive late (15 or more minutes).  Arriving late affects you and other patients whose appointments are after yours.  Also, if you miss three or more appointments without notifying the office, you may be dismissed from the clinic at the provider's discretion.      For prescription refill requests, have your pharmacy contact our office and allow 72 hours for refills to be completed.    Today you received the following chemotherapy and/or immunotherapy agents ferrlecit     To help prevent nausea and vomiting after your treatment, we encourage you to take your nausea medication as directed.  BELOW ARE SYMPTOMS THAT SHOULD BE REPORTED IMMEDIATELY: *FEVER GREATER THAN 100.4 F (38 C) OR HIGHER *CHILLS OR SWEATING *NAUSEA AND VOMITING THAT IS NOT CONTROLLED WITH YOUR NAUSEA MEDICATION *UNUSUAL SHORTNESS OF BREATH *UNUSUAL BRUISING OR BLEEDING *URINARY PROBLEMS (pain or burning when urinating, or frequent urination) *BOWEL PROBLEMS (unusual diarrhea, constipation, pain near the anus) TENDERNESS IN MOUTH AND THROAT WITH OR WITHOUT PRESENCE OF ULCERS (sore throat, sores in mouth, or a toothache) UNUSUAL RASH, SWELLING OR PAIN  UNUSUAL VAGINAL DISCHARGE OR ITCHING   Items with * indicate a potential emergency and should be followed up as soon as possible or go to the Emergency Department if any problems should occur.  Please show the CHEMOTHERAPY ALERT CARD or IMMUNOTHERAPY ALERT CARD at check-in to the  Emergency Department and triage nurse. Should you have questions after your visit or need to cancel or reschedule your appointment, please contact [facility]  [phone] and follow the prompts.  Office hours are 8:00 a.m. to 4:30 p.m. Monday - Friday. Please note that voicemails left after 4:00 p.m. may not be returned until the following business day.  We are closed weekends and major holidays. You have access to a nurse at all times for urgent questions. Please call the main number to the clinic [phone] and follow the prompts.  For any non-urgent questions, you may also contact your provider using MyChart. We now offer e-Visits for anyone 51 and older to request care online for non-urgent symptoms. For details visit [email or web address].   Also download the MyChart app! Go to the app store, search "MyChart", open the app, select [facility], and log in with your MyChart username and password.  Due to Covid, a mask is required upon entering the hospital/clinic. If you do not have a mask, one will be given to you upon arrival. For doctor visits, patients may have 1 support person aged 51 or older with them. For treatment visits, patients cannot have anyone with them due to current Covid guidelines and our immunocompromised population.

## 2021-03-25 ENCOUNTER — Encounter (INDEPENDENT_AMBULATORY_CARE_PROVIDER_SITE_OTHER): Payer: Self-pay | Admitting: Family Medicine

## 2021-03-25 ENCOUNTER — Other Ambulatory Visit (HOSPITAL_COMMUNITY): Payer: Self-pay | Admitting: Interventional Radiology

## 2021-03-25 ENCOUNTER — Ambulatory Visit (INDEPENDENT_AMBULATORY_CARE_PROVIDER_SITE_OTHER): Payer: BC Managed Care – PPO | Admitting: Family Medicine

## 2021-03-25 ENCOUNTER — Other Ambulatory Visit: Payer: Self-pay

## 2021-03-25 VITALS — BP 132/76 | HR 62 | Temp 97.7°F | Ht 64.0 in | Wt 161.0 lb

## 2021-03-25 DIAGNOSIS — E0590 Thyrotoxicosis, unspecified without thyrotoxic crisis or storm: Secondary | ICD-10-CM

## 2021-03-25 DIAGNOSIS — E559 Vitamin D deficiency, unspecified: Secondary | ICD-10-CM

## 2021-03-25 DIAGNOSIS — E88819 Insulin resistance, unspecified: Secondary | ICD-10-CM

## 2021-03-25 DIAGNOSIS — Z6832 Body mass index (BMI) 32.0-32.9, adult: Secondary | ICD-10-CM

## 2021-03-25 DIAGNOSIS — E669 Obesity, unspecified: Secondary | ICD-10-CM

## 2021-03-25 DIAGNOSIS — D259 Leiomyoma of uterus, unspecified: Secondary | ICD-10-CM

## 2021-03-25 DIAGNOSIS — E786 Lipoprotein deficiency: Secondary | ICD-10-CM

## 2021-03-25 DIAGNOSIS — E8881 Metabolic syndrome: Secondary | ICD-10-CM

## 2021-03-25 MED ORDER — VITAMIN D (ERGOCALCIFEROL) 1.25 MG (50000 UNIT) PO CAPS: 50000.0000 [IU] | ORAL_CAPSULE | ORAL | 0 refills | Status: DC

## 2021-03-26 LAB — COMPREHENSIVE METABOLIC PANEL
ALT: 15 IU/L (ref 0–32)
AST: 18 IU/L (ref 0–40)
Albumin/Globulin Ratio: 1.5 (ref 1.2–2.2)
Albumin: 4 g/dL (ref 3.8–4.9)
Alkaline Phosphatase: 58 IU/L (ref 44–121)
BUN/Creatinine Ratio: 21 (ref 9–23)
BUN: 12 mg/dL (ref 6–24)
Bilirubin Total: 0.6 mg/dL (ref 0.0–1.2)
CO2: 21 mmol/L (ref 20–29)
Calcium: 9.2 mg/dL (ref 8.7–10.2)
Chloride: 104 mmol/L (ref 96–106)
Creatinine, Ser: 0.58 mg/dL (ref 0.57–1.00)
Globulin, Total: 2.6 g/dL (ref 1.5–4.5)
Glucose: 78 mg/dL (ref 65–99)
Potassium: 4.2 mmol/L (ref 3.5–5.2)
Sodium: 139 mmol/L (ref 134–144)
Total Protein: 6.6 g/dL (ref 6.0–8.5)
eGFR: 109 mL/min/{1.73_m2} (ref >59)

## 2021-03-26 LAB — LIPID PANEL
Chol/HDL Ratio: 3.8 ratio (ref 0.0–4.4)
Cholesterol, Total: 120 mg/dL (ref 100–199)
HDL: 32 mg/dL — ABNORMAL LOW (ref >39)
LDL Chol Calc (NIH): 63 mg/dL (ref 0–99)
Triglycerides: 143 mg/dL (ref 0–149)
VLDL Cholesterol Cal: 25 mg/dL (ref 5–40)

## 2021-03-26 LAB — INSULIN, RANDOM: INSULIN: 6.5 u[IU]/mL (ref 2.6–24.9)

## 2021-03-26 LAB — TSH: TSH: <0.005 u[IU]/mL — ABNORMAL LOW (ref 0.450–4.500)

## 2021-03-26 LAB — T4, FREE: Free T4: 1.76 ng/dL (ref 0.82–1.77)

## 2021-03-26 LAB — VITAMIN D 25 HYDROXY (VIT D DEFICIENCY, FRACTURES): Vit D, 25-Hydroxy: 52 ng/mL (ref 30.0–100.0)

## 2021-03-26 NOTE — Progress Notes (Signed)
Chief Complaint:   OBESITY Jamie Barajas is here to discuss her progress with her obesity treatment plan along with follow-up of her obesity related diagnoses.   Today's visit was #: 21 Starting weight: 186 lbs Starting date: 07/03/2019 Today's weight: 161 lbs Today's date: 03/25/2021 Weight change since last visit: +3 lbs Total lbs lost to date: 25 lbs Body mass index is 27.64 kg/m.  Total weight loss percentage to date: -13.44%  Interim History:  Jamie Barajas has an ablation planned for her uterine fibroids.  She continues to require iron infusions.  She is followed by Hematology. Current Meal Plan: the Category 2 Plan for 80% of the time.  Current Exercise Plan: Cardio/strength training for 30-60 minutes 6 times per week.  Assessment/Plan:   1. Vitamin D deficiency Not at goal.  She is taking vitamin D 50,000 IU weekly.  Plan: Continue to take prescription Vitamin D '@50'$ ,000 IU every week as prescribed.  Will check vitamin D level today, as per below.  Lab Results  Component Value Date   VD25OH 40.1 08/06/2020   VD25OH 43.8 10/23/2019   - Refill Vitamin D, Ergocalciferol, (DRISDOL) 1.25 MG (50000 UNIT) CAPS capsule; Take 1 capsule (50,000 Units total) by mouth every 7 (seven) days.  Dispense: 4 capsule; Refill: 0 - VITAMIN D 25 Hydroxy (Vit-D Deficiency, Fractures)  2. Insulin resistance Improving, but not optimized. Goal is HgbA1c < 5.7, fasting insulin closer to 5.  Medication: None.    Plan:  She will continue to focus on protein-rich, low simple carbohydrate foods. We reviewed the importance of hydration, regular exercise for stress reduction, and restorative sleep.   Lab Results  Component Value Date   HGBA1C 5.6 08/06/2020   Lab Results  Component Value Date   INSULIN 4.0 08/06/2020   INSULIN 5.9 10/23/2019   INSULIN 12.2 07/03/2019   - Comprehensive metabolic panel - Insulin, random  3. Low HDL (under 40) Jamie Barajas's HDL was 43 on 08/06/2020.  Plan:  Will check  lipid panel today.  - Lipid panel  4. Hyperthyroidism Will check TSH and free T4 today.  - TSH - T4, free  5. Obesity, current BMI 27.6  Course: Jamie Barajas is currently in the action stage of change. As such, her goal is to continue with weight loss efforts.   Nutrition goals: She has agreed to the Category 2 Plan.   Exercise goals:  As is.  Behavioral modification strategies: increasing lean protein intake, decreasing simple carbohydrates, increasing vegetables, increasing water intake, and emotional eating strategies.  Jamie Barajas has agreed to follow-up with our clinic in 4 weeks. She was informed of the importance of frequent follow-up visits to maximize her success with intensive lifestyle modifications for her multiple health conditions.   Objective:   Blood pressure 132/76, pulse 62, temperature 97.7 F (36.5 C), temperature source Oral, height '5\' 4"'$  (1.626 m), weight 161 lb (73 kg), SpO2 99 %. Body mass index is 27.64 kg/m.  General: Cooperative, alert, well developed, in no acute distress. HEENT: Conjunctivae and lids unremarkable. Cardiovascular: Regular rhythm.  Lungs: Normal work of breathing. Neurologic: No focal deficits.   Lab Results  Component Value Date   HGBA1C 5.6 08/06/2020   HGBA1C 5.5 07/03/2019   Lab Results  Component Value Date   INSULIN 6.5 03/25/2021   INSULIN 4.0 08/06/2020   INSULIN 5.9 10/23/2019   INSULIN 12.2 07/03/2019   Lab Results  Component Value Date   TSH <0.005 (L) 03/25/2021   Lab Results  Component Value  Date   VD25OH 52.0 03/25/2021   VD25OH 40.1 08/06/2020   VD25OH 43.8 10/23/2019   Lab Results  Component Value Date   WBC 5.1 03/10/2021   HGB 11.7 (L) 03/10/2021   HCT 36.9 03/10/2021   MCV 63.7 (L) 03/10/2021   PLT 230 03/10/2021   Lab Results  Component Value Date   IRON 61 03/10/2021   TIBC 401 03/10/2021   FERRITIN <4 (L) 03/10/2021   Attestation Statements:   Reviewed by clinician on day of visit: allergies,  medications, problem list, medical history, surgical history, family history, social history, and previous encounter notes.  Time spent on visit including pre-visit chart review and post-visit care and charting was 33 minutes.   I, Water quality scientist, CMA, am acting as transcriptionist for Briscoe Deutscher, DO  I have reviewed the above documentation for accuracy and completeness, and I agree with the above. Briscoe Deutscher, DO

## 2021-05-06 ENCOUNTER — Other Ambulatory Visit: Payer: Self-pay

## 2021-05-06 ENCOUNTER — Ambulatory Visit (INDEPENDENT_AMBULATORY_CARE_PROVIDER_SITE_OTHER): Payer: BC Managed Care – PPO | Admitting: Family Medicine

## 2021-05-06 ENCOUNTER — Encounter (INDEPENDENT_AMBULATORY_CARE_PROVIDER_SITE_OTHER): Payer: Self-pay | Admitting: Family Medicine

## 2021-05-06 VITALS — BP 131/77 | HR 69 | Temp 98.1°F | Ht 64.0 in | Wt 157.0 lb

## 2021-05-06 DIAGNOSIS — Z9189 Other specified personal risk factors, not elsewhere classified: Secondary | ICD-10-CM | POA: Diagnosis not present

## 2021-05-06 DIAGNOSIS — I10 Essential (primary) hypertension: Secondary | ICD-10-CM

## 2021-05-06 DIAGNOSIS — Z6832 Body mass index (BMI) 32.0-32.9, adult: Secondary | ICD-10-CM

## 2021-05-06 DIAGNOSIS — E559 Vitamin D deficiency, unspecified: Secondary | ICD-10-CM

## 2021-05-06 DIAGNOSIS — E669 Obesity, unspecified: Secondary | ICD-10-CM

## 2021-05-06 DIAGNOSIS — E0590 Thyrotoxicosis, unspecified without thyrotoxic crisis or storm: Secondary | ICD-10-CM

## 2021-05-07 MED ORDER — VITAMIN D (ERGOCALCIFEROL) 1.25 MG (50000 UNIT) PO CAPS: 50000.0000 [IU] | ORAL_CAPSULE | ORAL | 0 refills | Status: DC

## 2021-05-07 NOTE — Progress Notes (Signed)
Chief Complaint:   OBESITY Jamie Barajas is here to discuss her progress with her obesity treatment plan along with follow-up of her obesity related diagnoses.   Today's visit was #: 22 Starting weight: 186 lbs Starting date: 07/03/2019 Today's weight: 157 lbs Today's date: 05/06/2021 Weight change since last visit: 4 lbs Total lbs lost to date: 29 lbs Body mass index is 26.95 kg/m.  Total weight loss percentage to date: -15.59%  Current Meal Plan: the Category 1 Plan for 60% of the time.  Current Exercise Plan: Cardio for 45 minutes 3 times per week.  Interim History:  Jamie Barajas was hyperthyroid on last blood draw.  Immediately called her endocrinologist and was seen.  Now taking methimazole 5 mg daily.  Assessment/Plan:   1. Vitamin D deficiency At goal.  She is taking vitamin D 50,000 IU weekly.  Plan: Continue to take prescription Vitamin D @50 ,000 IU every week as prescribed.  Follow-up for routine testing of Vitamin D, at least 2-3 times per year to avoid over-replacement.  Lab Results  Component Value Date   VD25OH 52.0 03/25/2021   VD25OH 40.1 08/06/2020   VD25OH 43.8 10/23/2019   - Refill Vitamin D, Ergocalciferol, (DRISDOL) 1.25 MG (50000 UNIT) CAPS capsule; Take 1 capsule (50,000 Units total) by mouth every 7 (seven) days.  Dispense: 4 capsule; Refill: 0  2. Hyperthyroidism Jamie Barajas is taking methimazole 5 mg daily.  Followed by Endocrinology.  Lab Results  Component Value Date   TSH <0.005 (L) 03/25/2021   3. Essential hypertension At goal. Medications: Norvasc 10 mg daily, metoprolol 25 mg daily.   Plan: Avoid buying foods that are: processed, frozen, or prepackaged to avoid excess salt. We will watch for signs of hypotension as she continues lifestyle modifications.  BP Readings from Last 3 Encounters:  05/06/21 131/77  03/25/21 132/76  03/24/21 130/89   Lab Results  Component Value Date   CREATININE 0.58 03/25/2021   4. At risk for activity  intolerance Jamie Barajas was given approximately 8 minutes of counseling today regarding her increased risk for exercise intolerance.  We discussed patient's specific personal and medical issues that raise our concern.    5. Obesity, current BMI 27  Course: Jamie Barajas is currently in the action stage of change. As such, her goal is to continue with weight loss efforts.   Nutrition goals: She has agreed to the Category 1 Plan.   Exercise goals:  As is.  Behavioral modification strategies: increasing lean protein intake, decreasing simple carbohydrates, increasing vegetables, and increasing water intake.  Jamie Barajas has agreed to follow-up with our clinic in 4 weeks. She was informed of the importance of frequent follow-up visits to maximize her success with intensive lifestyle modifications for her multiple health conditions.   Objective:   Blood pressure 131/77, pulse 69, temperature 98.1 F (36.7 C), temperature source Oral, height 5\' 4"  (1.626 m), weight 157 lb (71.2 kg), SpO2 99 %. Body mass index is 26.95 kg/m.  General: Cooperative, alert, well developed, in no acute distress. HEENT: Conjunctivae and lids unremarkable. Cardiovascular: Regular rhythm.  Lungs: Normal work of breathing. Neurologic: No focal deficits.   Lab Results  Component Value Date   CREATININE 0.58 03/25/2021   BUN 12 03/25/2021   NA 139 03/25/2021   K 4.2 03/25/2021   CL 104 03/25/2021   CO2 21 03/25/2021   Lab Results  Component Value Date   ALT 15 03/25/2021   AST 18 03/25/2021   ALKPHOS 58 03/25/2021   BILITOT  0.6 03/25/2021   Lab Results  Component Value Date   HGBA1C 5.6 08/06/2020   HGBA1C 5.5 07/03/2019   Lab Results  Component Value Date   INSULIN 6.5 03/25/2021   INSULIN 4.0 08/06/2020   INSULIN 5.9 10/23/2019   INSULIN 12.2 07/03/2019   Lab Results  Component Value Date   TSH <0.005 (L) 03/25/2021   Lab Results  Component Value Date   CHOL 120 03/25/2021   HDL 32 (L) 03/25/2021    LDLCALC 63 03/25/2021   TRIG 143 03/25/2021   CHOLHDL 3.8 03/25/2021   Lab Results  Component Value Date   VD25OH 52.0 03/25/2021   VD25OH 40.1 08/06/2020   VD25OH 43.8 10/23/2019   Lab Results  Component Value Date   WBC 5.1 03/10/2021   HGB 11.7 (L) 03/10/2021   HCT 36.9 03/10/2021   MCV 63.7 (L) 03/10/2021   PLT 230 03/10/2021   Lab Results  Component Value Date   IRON 61 03/10/2021   TIBC 401 03/10/2021   FERRITIN <4 (L) 03/10/2021   Attestation Statements:   Reviewed by clinician on day of visit: allergies, medications, problem list, medical history, surgical history, family history, social history, and previous encounter notes.  I, Water quality scientist, CMA, am acting as transcriptionist for Briscoe Deutscher, DO  I have reviewed the above documentation for accuracy and completeness, and I agree with the above. Briscoe Deutscher, DO

## 2021-05-12 ENCOUNTER — Inpatient Hospital Stay: Payer: BC Managed Care – PPO | Admitting: Family

## 2021-05-12 ENCOUNTER — Inpatient Hospital Stay: Payer: BC Managed Care – PPO | Attending: Hematology & Oncology

## 2021-05-12 ENCOUNTER — Encounter: Payer: Self-pay | Admitting: Family

## 2021-05-12 ENCOUNTER — Other Ambulatory Visit: Payer: Self-pay

## 2021-05-12 VITALS — BP 130/86 | HR 74 | Temp 98.9°F | Resp 18 | Wt 160.0 lb

## 2021-05-12 DIAGNOSIS — D573 Sickle-cell trait: Secondary | ICD-10-CM | POA: Insufficient documentation

## 2021-05-12 DIAGNOSIS — D500 Iron deficiency anemia secondary to blood loss (chronic): Secondary | ICD-10-CM | POA: Insufficient documentation

## 2021-05-12 DIAGNOSIS — D509 Iron deficiency anemia, unspecified: Secondary | ICD-10-CM | POA: Diagnosis not present

## 2021-05-12 DIAGNOSIS — R5383 Other fatigue: Secondary | ICD-10-CM | POA: Diagnosis not present

## 2021-05-12 DIAGNOSIS — R0602 Shortness of breath: Secondary | ICD-10-CM | POA: Insufficient documentation

## 2021-05-12 DIAGNOSIS — D649 Anemia, unspecified: Secondary | ICD-10-CM

## 2021-05-12 DIAGNOSIS — I10 Essential (primary) hypertension: Secondary | ICD-10-CM | POA: Insufficient documentation

## 2021-05-12 DIAGNOSIS — N920 Excessive and frequent menstruation with regular cycle: Secondary | ICD-10-CM | POA: Diagnosis present

## 2021-05-12 DIAGNOSIS — Z803 Family history of malignant neoplasm of breast: Secondary | ICD-10-CM | POA: Diagnosis not present

## 2021-05-12 DIAGNOSIS — Z79899 Other long term (current) drug therapy: Secondary | ICD-10-CM | POA: Insufficient documentation

## 2021-05-12 DIAGNOSIS — D563 Thalassemia minor: Secondary | ICD-10-CM

## 2021-05-12 LAB — CBC WITH DIFFERENTIAL (CANCER CENTER ONLY)
Abs Immature Granulocytes: 0.05 10*3/uL (ref 0.00–0.07)
Basophils Absolute: 0 10*3/uL (ref 0.0–0.1)
Basophils Relative: 0 %
Eosinophils Absolute: 0 10*3/uL (ref 0.0–0.5)
Eosinophils Relative: 0 %
HCT: 39.8 % (ref 36.0–46.0)
Hemoglobin: 13.1 g/dL (ref 12.0–15.0)
Immature Granulocytes: 1 %
Lymphocytes Relative: 25 %
Lymphs Abs: 1.1 10*3/uL (ref 0.7–4.0)
MCH: 21.7 pg — ABNORMAL LOW (ref 26.0–34.0)
MCHC: 32.9 g/dL (ref 30.0–36.0)
MCV: 65.8 fL — ABNORMAL LOW (ref 80.0–100.0)
Monocytes Absolute: 0.3 10*3/uL (ref 0.1–1.0)
Monocytes Relative: 7 %
Neutro Abs: 3 10*3/uL (ref 1.7–7.7)
Neutrophils Relative %: 67 %
Platelet Count: 229 10*3/uL (ref 150–400)
RBC: 6.05 MIL/uL — ABNORMAL HIGH (ref 3.87–5.11)
RDW: 19.5 % — ABNORMAL HIGH (ref 11.5–15.5)
WBC Count: 4.5 10*3/uL (ref 4.0–10.5)
nRBC: 0 % (ref 0.0–0.2)

## 2021-05-12 LAB — RETICULOCYTES
Immature Retic Fract: 9.1 % (ref 2.3–15.9)
RBC.: 6.02 MIL/uL — ABNORMAL HIGH (ref 3.87–5.11)
Retic Count, Absolute: 51.8 10*3/uL (ref 19.0–186.0)
Retic Ct Pct: 0.9 % (ref 0.4–3.1)

## 2021-05-12 NOTE — Progress Notes (Signed)
Hematology and Oncology Follow Up Visit  Jamie Barajas [id number] 1970-09-25 50 y.o. 05/12/2021   Principle Diagnosis:  Iron deficiency anemia secondary to heavy cycle Sickle cell trait Alpha thalassemia minor trait  Current Therapy:   IV iron as indicated Folic acid 1 mg PO daily   Interim History:  Jamie Barajas is here today for follow-up. She is doing well and states that she could tell an improvement in Jamie Barajas symptoms after receiving IV iron.  She still has some fatigue and mild SOB when exercising. She does cardio and strength training regularly.  He cycle is heavy and continues to be irregular. She states that Jamie Barajas gynecologist plans to do a UFE in December.   No other blood loss noted. No bruising or petechiae.  She states that Jamie Barajas hyperthyroidism relapsed and she is now on Methimazole.  No fever, chills, n/v, cough, rash, dizziness, chest pain, abdominal pain or changes in bowel or bladder habits.  No swelling, tenderness, numbness or tingling in Jamie Barajas extremities. She has occasional puffiness in Jamie Barajas fingers.  No falls or syncope.  She has maintained a good appetite and is staying well hydrated. Jamie Barajas weight is stable at 160 lbs.   ECOG Performance Status: 1 - Symptomatic but completely ambulatory  Medications:  Allergies as of 05/12/2021       Reactions   Azithromycin Itching        Medication List        Accurate as of May 12, 2021  2:34 PM. If you have any questions, ask your nurse or doctor.          amLODipine 10 MG tablet Commonly known as: NORVASC Take 10 mg by mouth daily.   folic acid 1 MG tablet Commonly known as: FOLVITE Take 1 tablet (1 mg total) by mouth daily.   LORazepam 0.5 MG tablet Commonly known as: ATIVAN Take 0.5 mg by mouth daily as needed. For anxiety.   methimazole 5 MG tablet Commonly known as: TAPAZOLE Take 5 mg by mouth daily.   metoprolol succinate 25 MG 24 hr tablet Commonly known as: TOPROL-XL Take 25 mg  by mouth daily.   Vitamin D (Ergocalciferol) 1.25 MG (50000 UNIT) Caps capsule Commonly known as: DRISDOL Take 1 capsule (50,000 Units total) by mouth every 7 (seven) days.        Allergies:  Allergies  Allergen Reactions   Azithromycin Itching    Past Medical History, Surgical history, Social history, and Family History were reviewed and updated.  Review of Systems: All other 10 point review of systems is negative.   Physical Exam:  weight is 160 lb (72.6 kg). Jamie Barajas oral temperature is 98.9 F (37.2 C). Jamie Barajas blood pressure is 130/86 and Jamie Barajas pulse is 74. Jamie Barajas respiration is 18 and oxygen saturation is 100%.   Wt Readings from Last 3 Encounters:  05/12/21 160 lb (72.6 kg)  05/06/21 157 lb (71.2 kg)  03/25/21 161 lb (73 kg)    Ocular: Sclerae unicteric, pupils equal, round and reactive to light Ear-nose-throat: Oropharynx clear, dentition fair Lymphatic: No cervical or supraclavicular adenopathy Lungs no rales or rhonchi, good excursion bilaterally Heart regular rate and rhythm, no murmur appreciated Abd soft, nontender, positive bowel sounds MSK no focal spinal tenderness, no joint edema Neuro: non-focal, well-oriented, appropriate affect Breasts: Deferred   Lab Results  Component Value Date   WBC 4.5 05/12/2021   HGB 13.1 05/12/2021   HCT 39.8 05/12/2021   MCV 65.8 (L) 05/12/2021   PLT 229 05/12/2021  Lab Results  Component Value Date   FERRITIN <4 (L) 03/10/2021   IRON 61 03/10/2021   TIBC 401 03/10/2021   UIBC 340 03/10/2021   IRONPCTSAT 15 (L) 03/10/2021   Lab Results  Component Value Date   RETICCTPCT 0.9 05/12/2021   RBC 6.02 (H) 05/12/2021   No results found for: KPAFRELGTCHN, LAMBDASER, KAPLAMBRATIO No results found for: IGGSERUM, IGA, IGMSERUM No results found for: Odetta Pink, SPEI   Chemistry      Component Value Date/Time   NA 139 03/25/2021 1113   K 4.2 03/25/2021 1113   CL 104  03/25/2021 1113   CO2 21 03/25/2021 1113   BUN 12 03/25/2021 1113   CREATININE 0.58 03/25/2021 1113   CREATININE 0.70 03/10/2021 0854   CREATININE 0.71 02/19/2021 1119      Component Value Date/Time   CALCIUM 9.2 03/25/2021 1113   ALKPHOS 58 03/25/2021 1113   AST 18 03/25/2021 1113   AST 25 03/10/2021 0854   ALT 15 03/25/2021 1113   ALT 24 03/10/2021 0854   BILITOT 0.6 03/25/2021 1113   BILITOT 0.7 03/10/2021 0854       Impression and Plan: [name] is a very pleasant 50 yo African American female with history of anemia secondary to heavy cycles, sickle cell trait and alpha thalassemia minor trait.  Iron studies are pending. We will replace if needed.  She is doing well on Folic acid and will ill continue the 1 mg PO daily long term.  Follow-up in 2 months. She can contact our office with any questions or concerns.   Lottie Dawson, NP 9/26/20222:34 PM

## 2021-05-13 LAB — IRON AND TIBC
Iron: 37 ug/dL — ABNORMAL LOW (ref 41–142)
Saturation Ratios: 9 % — ABNORMAL LOW (ref 21–57)
TIBC: 395 ug/dL (ref 236–444)
UIBC: 359 ug/dL (ref 120–384)

## 2021-05-13 LAB — FERRITIN: Ferritin: 6 ng/mL — ABNORMAL LOW (ref 11–307)

## 2021-05-19 ENCOUNTER — Inpatient Hospital Stay: Payer: BC Managed Care – PPO | Attending: Hematology & Oncology

## 2021-05-19 ENCOUNTER — Other Ambulatory Visit: Payer: Self-pay

## 2021-05-19 VITALS — BP 118/70 | HR 62 | Temp 97.9°F | Resp 18

## 2021-05-19 DIAGNOSIS — Z803 Family history of malignant neoplasm of breast: Secondary | ICD-10-CM | POA: Insufficient documentation

## 2021-05-19 DIAGNOSIS — N920 Excessive and frequent menstruation with regular cycle: Secondary | ICD-10-CM | POA: Diagnosis present

## 2021-05-19 DIAGNOSIS — D563 Thalassemia minor: Secondary | ICD-10-CM | POA: Insufficient documentation

## 2021-05-19 DIAGNOSIS — D500 Iron deficiency anemia secondary to blood loss (chronic): Secondary | ICD-10-CM | POA: Diagnosis not present

## 2021-05-19 DIAGNOSIS — D509 Iron deficiency anemia, unspecified: Secondary | ICD-10-CM

## 2021-05-19 DIAGNOSIS — I10 Essential (primary) hypertension: Secondary | ICD-10-CM | POA: Insufficient documentation

## 2021-05-19 DIAGNOSIS — D573 Sickle-cell trait: Secondary | ICD-10-CM | POA: Diagnosis not present

## 2021-05-19 DIAGNOSIS — Z79899 Other long term (current) drug therapy: Secondary | ICD-10-CM | POA: Diagnosis not present

## 2021-05-19 DIAGNOSIS — R0602 Shortness of breath: Secondary | ICD-10-CM | POA: Diagnosis not present

## 2021-05-19 DIAGNOSIS — R5383 Other fatigue: Secondary | ICD-10-CM | POA: Diagnosis not present

## 2021-05-19 MED ORDER — SODIUM CHLORIDE 0.9 % IV SOLN
125.0000 mg | Freq: Once | INTRAVENOUS | Status: AC
  Administered 2021-05-19: 125 mg via INTRAVENOUS
  Filled 2021-05-19: qty 125

## 2021-05-19 MED ORDER — SODIUM CHLORIDE 0.9% FLUSH: 3.0000 mL | Freq: Once | INTRAVENOUS | Status: DC | PRN

## 2021-05-19 MED ORDER — SODIUM CHLORIDE 0.9 % IV SOLN: Freq: Once | INTRAVENOUS | Status: AC

## 2021-05-19 MED ORDER — SODIUM CHLORIDE 0.9% FLUSH: 10.0000 mL | Freq: Once | INTRAVENOUS | Status: DC | PRN

## 2021-05-19 NOTE — Patient Instructions (Signed)
Sodium Ferric Gluconate Complex Injection What is this medication? SODIUM FERRIC GLUCONATE COMPLEX (SOE dee um FER ik GLOO koe nate KOM pleks) treats low levels of iron (iron deficiency anemia) in people with kidney disease. Iron is a mineral that plays an important role in making red blood cells, which carry oxygen from your lungs to the rest of your body. This medicine may be used for other purposes; ask your health care provider or pharmacist if you have questions. COMMON BRAND NAME(S): Ferrlecit, Nulecit What should I tell my care team before I take this medication? They need to know if you have any of the following conditions: Anemia that is not from iron deficiency High levels of iron in the blood An unusual or allergic reaction to iron, other medications, foods, dyes, or preservatives Pregnant or are trying to become pregnant Breast-feeding How should I use this medication? This medication is injected into a vein. It is given by your care team in a hospital or clinic setting. Talk to your care team about the use of this medication in children. While it may be prescribed for children as young as 6 years for selected conditions, precautions do apply. Overdosage: If you think you have taken too much of this medicine contact a poison control center or emergency room at once. NOTE: This medicine is only for you. Do not share this medicine with others. What if I miss a dose? It is important not to miss your dose. Call your care team if you are unable to keep an appointment. What may interact with this medication? Do not take this medication with any of the following: Deferasirox Deferoxamine Dimercaprol This medication may also interact with the following: Other iron products This list may not describe all possible interactions. Give your health care provider a list of all the medicines, herbs, non-prescription drugs, or dietary supplements you use. Also tell them if you smoke, drink  alcohol, or use illegal drugs. Some items may interact with your medicine. What should I watch for while using this medication? Your condition will be monitored carefully while you are receiving this medication. Visit your care team for regular checks on your progress. You may need blood work while you are taking this medication. What side effects may I notice from receiving this medication? Side effects that you should report to your care team as soon as possible: Allergic reactions-skin rash, itching, hives, swelling of the face, lips, tongue, or throat Low blood pressure-dizziness, feeling faint or lightheaded, blurry vision Shortness of breath Side effects that usually do not require medical attention (report to your care team if they continue or are bothersome): Flushing Headache Joint pain Muscle pain Nausea Pain, redness, or irritation at injection site This list may not describe all possible side effects. Call your doctor for medical advice about side effects. You may report side effects to FDA at 1-800-FDA-1088. Where should I keep my medication? This medication is given in a hospital or clinic and will not be stored at home. NOTE: This sheet is a summary. It may not cover all possible information. If you have questions about this medicine, talk to your doctor, pharmacist, or health care provider.  2022 Elsevier/Gold Standard (2020-09-16 11:46:34)  

## 2021-05-26 ENCOUNTER — Inpatient Hospital Stay: Payer: BC Managed Care – PPO

## 2021-06-03 ENCOUNTER — Ambulatory Visit: Payer: BC Managed Care – PPO

## 2021-06-05 ENCOUNTER — Inpatient Hospital Stay: Payer: BC Managed Care – PPO

## 2021-06-05 ENCOUNTER — Other Ambulatory Visit: Payer: Self-pay

## 2021-06-05 VITALS — BP 127/82 | HR 63 | Temp 97.8°F | Resp 18

## 2021-06-05 DIAGNOSIS — D509 Iron deficiency anemia, unspecified: Secondary | ICD-10-CM

## 2021-06-05 DIAGNOSIS — D500 Iron deficiency anemia secondary to blood loss (chronic): Secondary | ICD-10-CM | POA: Diagnosis not present

## 2021-06-05 MED ORDER — SODIUM CHLORIDE 0.9 % IV SOLN
125.0000 mg | Freq: Once | INTRAVENOUS | Status: AC
  Administered 2021-06-05: 125 mg via INTRAVENOUS
  Filled 2021-06-05: qty 125

## 2021-06-05 MED ORDER — SODIUM CHLORIDE 0.9 % IV SOLN: Freq: Once | INTRAVENOUS | Status: AC

## 2021-06-05 NOTE — Patient Instructions (Signed)

## 2021-06-25 ENCOUNTER — Ambulatory Visit (INDEPENDENT_AMBULATORY_CARE_PROVIDER_SITE_OTHER): Payer: BC Managed Care – PPO | Admitting: Family Medicine

## 2021-07-14 ENCOUNTER — Inpatient Hospital Stay: Payer: BC Managed Care – PPO | Attending: Hematology & Oncology | Admitting: Family

## 2021-07-14 ENCOUNTER — Inpatient Hospital Stay: Payer: BC Managed Care – PPO

## 2021-07-14 ENCOUNTER — Encounter: Payer: Self-pay | Admitting: Family

## 2021-07-14 ENCOUNTER — Other Ambulatory Visit: Payer: Self-pay

## 2021-07-14 VITALS — BP 131/83 | HR 77 | Temp 98.7°F | Resp 17 | Wt 163.8 lb

## 2021-07-14 DIAGNOSIS — D500 Iron deficiency anemia secondary to blood loss (chronic): Secondary | ICD-10-CM | POA: Insufficient documentation

## 2021-07-14 DIAGNOSIS — D573 Sickle-cell trait: Secondary | ICD-10-CM | POA: Diagnosis not present

## 2021-07-14 DIAGNOSIS — D563 Thalassemia minor: Secondary | ICD-10-CM

## 2021-07-14 DIAGNOSIS — D509 Iron deficiency anemia, unspecified: Secondary | ICD-10-CM

## 2021-07-14 DIAGNOSIS — N920 Excessive and frequent menstruation with regular cycle: Secondary | ICD-10-CM | POA: Insufficient documentation

## 2021-07-14 LAB — CBC WITH DIFFERENTIAL (CANCER CENTER ONLY)
Abs Immature Granulocytes: 0.01 10*3/uL (ref 0.00–0.07)
Basophils Absolute: 0 10*3/uL (ref 0.0–0.1)
Basophils Relative: 0 %
Eosinophils Absolute: 0 10*3/uL (ref 0.0–0.5)
Eosinophils Relative: 0 %
HCT: 42.4 % (ref 36.0–46.0)
Hemoglobin: 14.1 g/dL (ref 12.0–15.0)
Immature Granulocytes: 0 %
Lymphocytes Relative: 27 %
Lymphs Abs: 1.3 10*3/uL (ref 0.7–4.0)
MCH: 23.1 pg — ABNORMAL LOW (ref 26.0–34.0)
MCHC: 33.3 g/dL (ref 30.0–36.0)
MCV: 69.4 fL — ABNORMAL LOW (ref 80.0–100.0)
Monocytes Absolute: 0.4 10*3/uL (ref 0.1–1.0)
Monocytes Relative: 7 %
Neutro Abs: 3.1 10*3/uL (ref 1.7–7.7)
Neutrophils Relative %: 66 %
Platelet Count: 214 10*3/uL (ref 150–400)
RBC: 6.11 MIL/uL — ABNORMAL HIGH (ref 3.87–5.11)
RDW: 17.7 % — ABNORMAL HIGH (ref 11.5–15.5)
WBC Count: 4.8 10*3/uL (ref 4.0–10.5)
nRBC: 0 % (ref 0.0–0.2)

## 2021-07-14 LAB — RETICULOCYTES
Immature Retic Fract: 12.4 % (ref 2.3–15.9)
RBC.: 6.1 MIL/uL — ABNORMAL HIGH (ref 3.87–5.11)
Retic Count, Absolute: 67.7 10*3/uL (ref 19.0–186.0)
Retic Ct Pct: 1.1 % (ref 0.4–3.1)

## 2021-07-14 NOTE — Progress Notes (Signed)
Hematology and Oncology Follow Up Visit  Jamie Barajas [id number] [birth year] 50 y.o. 07/14/2021   Principle Diagnosis:  Iron deficiency anemia secondary to heavy cycle, uterine fibroids Sickle cell trait Alpha thalassemia minor trait   Current Therapy:        IV iron as indicated Folic acid 1 mg PO daily   Interim History:  Jamie Barajas is here today for follow-up. She is doing well and notes improvement in her energy and stamina since her last IV iron infusions.  Hgb is now 14.1, MCV 69, platelets 214 and WBC count 4.8.  She states that her cycle sis regular and flow is still heavy. No other blood loss noted. No bruising or petechiae.  She states that she is waiting to schedule a uterine embolization.  No fever, chills, n/v, cough, rash, dizziness, SOB, chest pain, palpitations, abdominal pain or changes in bowel or bladder habits.  No swelling, tenderness, numbness or tingling in her extremities.  No falls or syncope to report.  She has started working out again.  She has a good appetite and is staying well hydrated. Her weight is stable at 163 lbs.   ECOG Performance Status: 1 - Symptomatic but completely ambulatory  Medications:  Allergies as of 07/14/2021       Reactions   Azithromycin Itching        Medication List        Accurate as of July 14, 2021  2:48 PM. If you have any questions, ask your nurse or doctor.          amLODipine 10 MG tablet Commonly known as: NORVASC Take 10 mg by mouth daily.   folic acid 1 MG tablet Commonly known as: FOLVITE Take 1 tablet (1 mg total) by mouth daily.   LORazepam 0.5 MG tablet Commonly known as: ATIVAN Take 0.5 mg by mouth daily as needed. For anxiety.   methimazole 5 MG tablet Commonly known as: TAPAZOLE Take 5 mg by mouth daily.   metoprolol succinate 25 MG 24 hr tablet Commonly known as: TOPROL-XL Take 25 mg by mouth daily.   Vitamin D (Ergocalciferol) 1.25 MG (50000 UNIT) Caps  capsule Commonly known as: DRISDOL Take 1 capsule (50,000 Units total) by mouth every 7 (seven) days.        Allergies:  Allergies  Allergen Reactions   Azithromycin Itching    Past Medical History, Surgical history, Social history, and Family History were reviewed and updated.  Review of Systems: All other 10 point review of systems is negative.   Physical Exam:  weight is 163 lb 12.8 oz (74.3 kg). Her oral temperature is 98.7 F (37.1 C). Her blood pressure is 131/83 and her pulse is 77. Her respiration is 17 and oxygen saturation is 100%.   Wt Readings from Last 3 Encounters:  07/14/21 163 lb 12.8 oz (74.3 kg)  05/12/21 160 lb (72.6 kg)  05/06/21 157 lb (71.2 kg)    Ocular: Sclerae unicteric, pupils equal, round and reactive to light Ear-nose-throat: Oropharynx clear, dentition fair Lymphatic: No cervical or supraclavicular adenopathy Lungs no rales or rhonchi, good excursion bilaterally Heart regular rate and rhythm, no murmur appreciated Abd soft, nontender, positive bowel sounds MSK no focal spinal tenderness, no joint edema Neuro: non-focal, well-oriented, appropriate affect Breasts: Deferred   Lab Results  Component Value Date   WBC 4.8 07/14/2021   HGB 14.1 07/14/2021   HCT 42.4 07/14/2021   MCV 69.4 (L) 07/14/2021   PLT 214 07/14/2021   Lab Results  Component Value Date   FERRITIN 6 (L) 05/12/2021   IRON 37 (L) 05/12/2021   TIBC 395 05/12/2021   UIBC 359 05/12/2021   IRONPCTSAT 9 (L) 05/12/2021   Lab Results  Component Value Date   RETICCTPCT 1.1 07/14/2021   RBC 6.11 (H) 07/14/2021   RBC 6.10 (H) 07/14/2021   No results found for: KPAFRELGTCHN, LAMBDASER, KAPLAMBRATIO No results found for: IGGSERUM, IGA, IGMSERUM No results found for: Odetta Pink, SPEI   Chemistry      Component Value Date/Time   NA 139 03/25/2021 1113   K 4.2 03/25/2021 1113   CL 104 03/25/2021 1113   CO2 21  03/25/2021 1113   BUN 12 03/25/2021 1113   CREATININE 0.58 03/25/2021 1113   CREATININE 0.70 03/10/2021 0854   CREATININE 0.71 02/19/2021 1119      Component Value Date/Time   CALCIUM 9.2 03/25/2021 1113   ALKPHOS 58 03/25/2021 1113   AST 18 03/25/2021 1113   AST 25 03/10/2021 0854   ALT 15 03/25/2021 1113   ALT 24 03/10/2021 0854   BILITOT 0.6 03/25/2021 1113   BILITOT 0.7 03/10/2021 0854       Impression and Plan: Jamie Barajas is a very pleasant 50 yo African American female with history of anemia secondary to heavy cycles, sickle cell trait and alpha thalassemia minor trait.  Iron studies are pending. We will replace if needed.  Follow-up in 3 months.  She can contact our office with any questions or concerns.   Lottie Dawson, NP 11/28/20222:48 PM

## 2021-07-15 LAB — IRON AND TIBC
Iron: 109 ug/dL (ref 28–170)
Saturation Ratios: 26 % (ref 10.4–31.8)
TIBC: 424 ug/dL (ref 250–450)
UIBC: 315 ug/dL

## 2021-07-15 LAB — FERRITIN: Ferritin: 8 ng/mL — ABNORMAL LOW (ref 11–307)

## 2021-07-17 ENCOUNTER — Other Ambulatory Visit (HOSPITAL_COMMUNITY): Payer: BC Managed Care – PPO

## 2021-07-17 ENCOUNTER — Ambulatory Visit (HOSPITAL_COMMUNITY): Payer: BC Managed Care – PPO

## 2021-07-18 ENCOUNTER — Telehealth: Payer: Self-pay | Admitting: Family

## 2021-07-21 ENCOUNTER — Telehealth: Payer: Self-pay | Admitting: *Deleted

## 2021-07-21 NOTE — Telephone Encounter (Signed)
Per scheduling message Judson Roch called and lvm of call back to schedule (1) dose of IV Iron

## 2021-07-28 ENCOUNTER — Inpatient Hospital Stay: Payer: BC Managed Care – PPO

## 2021-08-04 ENCOUNTER — Other Ambulatory Visit: Payer: Self-pay

## 2021-08-04 ENCOUNTER — Inpatient Hospital Stay: Payer: BC Managed Care – PPO | Attending: Hematology & Oncology

## 2021-08-04 VITALS — BP 138/86 | HR 55 | Temp 98.7°F | Resp 17

## 2021-08-04 DIAGNOSIS — N920 Excessive and frequent menstruation with regular cycle: Secondary | ICD-10-CM | POA: Insufficient documentation

## 2021-08-04 DIAGNOSIS — D500 Iron deficiency anemia secondary to blood loss (chronic): Secondary | ICD-10-CM | POA: Insufficient documentation

## 2021-08-04 DIAGNOSIS — D509 Iron deficiency anemia, unspecified: Secondary | ICD-10-CM

## 2021-08-04 MED ORDER — SODIUM CHLORIDE 0.9 % IV SOLN: Freq: Once | INTRAVENOUS | Status: AC

## 2021-08-04 MED ORDER — SODIUM CHLORIDE 0.9 % IV SOLN
125.0000 mg | Freq: Once | INTRAVENOUS | Status: AC
  Administered 2021-08-04: 11:00:00 125 mg via INTRAVENOUS
  Filled 2021-08-04: qty 10

## 2021-08-04 NOTE — Patient Instructions (Signed)
Sodium Ferric Gluconate Complex Injection ?What is this medication? ?SODIUM FERRIC GLUCONATE COMPLEX (SOE dee um FER ik GLOO koe nate KOM pleks) treats low levels of iron (iron deficiency anemia) in people with kidney disease. Iron is a mineral that plays an important role in making red blood cells, which carry oxygen from your lungs to the rest of your body. ?This medicine may be used for other purposes; ask your health care provider or pharmacist if you have questions. ?COMMON BRAND NAME(S): Ferrlecit, Nulecit ?What should I tell my care team before I take this medication? ?They need to know if you have any of the following conditions: ?Anemia that is not from iron deficiency ?High levels of iron in the blood ?An unusual or allergic reaction to iron, other medications, foods, dyes, or preservatives ?Pregnant or are trying to become pregnant ?Breast-feeding ?How should I use this medication? ?This medication is injected into a vein. It is given by your care team in a hospital or clinic setting. ?Talk to your care team about the use of this medication in children. While it may be prescribed for children as young as 6 years for selected conditions, precautions do apply. ?Overdosage: If you think you have taken too much of this medicine contact a poison control center or emergency room at once. ?NOTE: This medicine is only for you. Do not share this medicine with others. ?What if I miss a dose? ?It is important not to miss your dose. Call your care team if you are unable to keep an appointment. ?What may interact with this medication? ?Do not take this medication with any of the following: ?Deferasirox ?Deferoxamine ?Dimercaprol ?This medication may also interact with the following: ?Other iron products ?This list may not describe all possible interactions. Give your health care provider a list of all the medicines, herbs, non-prescription drugs, or dietary supplements you use. Also tell them if you smoke, drink  alcohol, or use illegal drugs. Some items may interact with your medicine. ?What should I watch for while using this medication? ?Your condition will be monitored carefully while you are receiving this medication. ?Visit your care team for regular checks on your progress. You may need blood work while you are taking this medication. ?What side effects may I notice from receiving this medication? ?Side effects that you should report to your care team as soon as possible: ?Allergic reactions--skin rash, itching, hives, swelling of the face, lips, tongue, or throat ?Low blood pressure--dizziness, feeling faint or lightheaded, blurry vision ?Shortness of breath ?Side effects that usually do not require medical attention (report to your care team if they continue or are bothersome): ?Flushing ?Headache ?Joint pain ?Muscle pain ?Nausea ?Pain, redness, or irritation at injection site ?This list may not describe all possible side effects. Call your doctor for medical advice about side effects. You may report side effects to FDA at 1-800-FDA-1088. ?Where should I keep my medication? ?This medication is given in a hospital or clinic and will not be stored at home. ?NOTE: This sheet is a summary. It may not cover all possible information. If you have questions about this medicine, talk to your doctor, pharmacist, or health care provider. ?? 2022 Elsevier/Gold Standard (2020-12-27 00:00:00) ? ?

## 2021-08-06 ENCOUNTER — Ambulatory Visit (INDEPENDENT_AMBULATORY_CARE_PROVIDER_SITE_OTHER): Payer: BC Managed Care – PPO | Admitting: Family Medicine

## 2021-08-06 ENCOUNTER — Other Ambulatory Visit: Payer: Self-pay

## 2021-08-06 ENCOUNTER — Encounter (INDEPENDENT_AMBULATORY_CARE_PROVIDER_SITE_OTHER): Payer: Self-pay | Admitting: Family Medicine

## 2021-08-06 VITALS — BP 133/75 | HR 54 | Temp 98.4°F | Ht 64.0 in | Wt 162.0 lb

## 2021-08-06 DIAGNOSIS — E538 Deficiency of other specified B group vitamins: Secondary | ICD-10-CM | POA: Diagnosis not present

## 2021-08-06 DIAGNOSIS — E0590 Thyrotoxicosis, unspecified without thyrotoxic crisis or storm: Secondary | ICD-10-CM | POA: Diagnosis not present

## 2021-08-06 DIAGNOSIS — R5381 Other malaise: Secondary | ICD-10-CM

## 2021-08-06 DIAGNOSIS — Z6832 Body mass index (BMI) 32.0-32.9, adult: Secondary | ICD-10-CM

## 2021-08-06 DIAGNOSIS — E559 Vitamin D deficiency, unspecified: Secondary | ICD-10-CM

## 2021-08-06 DIAGNOSIS — E669 Obesity, unspecified: Secondary | ICD-10-CM

## 2021-08-06 DIAGNOSIS — R5383 Other fatigue: Secondary | ICD-10-CM

## 2021-08-06 MED ORDER — VITAMIN D (ERGOCALCIFEROL) 1.25 MG (50000 UNIT) PO CAPS: 50000.0000 [IU] | ORAL_CAPSULE | ORAL | 0 refills | Status: DC

## 2021-08-06 NOTE — Progress Notes (Signed)
Chief Complaint:   OBESITY Jamie Barajas is here to discuss her progress with her obesity treatment plan along with follow-up of her obesity related diagnoses. See Medical Weight Management Flowsheet for complete bioelectrical impedance results.  Today's visit was #: 23 Starting weight: 186 lbs Starting date: 07/03/2019 Weight change since last visit: +5 lbs Total lbs lost to date: 24 lbs Total weight loss percentage to date: -12.90%  Nutrition Plan: Category 2 Plan for 50% of the time.  Activity: None.  Interim History: Jamie Barajas has hyperthyroidism and is on methimazole.  TSH improving.  She just had an iron infusion on Monday.  She has decreased exercise (strength training).  She endorses eating less protein.  She says she still has not done uterine embolization for fibroids.  Assessment/Plan:   1. Malaise and fatigue Multiple reasons including methimazole, beta blocker, low ferritin, elevated ANA (fatigue related to autoimmune condition). Labs ordered today and routine recommendations discussed. She will continue to focus on protein-rich, low simple carbohydrate foods. We reviewed the importance of hydration, regular exercise for stress reduction, and restorative sleep.   2. Vitamin D deficiency At goal. She is taking vitamin D 50,000 IU weekly.  Plan: Continue to take prescription Vitamin D @50 ,000 IU every week as prescribed.  Will check vitamin D level today.  Lab Results  Component Value Date   VD25OH 52.0 03/25/2021   VD25OH 40.1 08/06/2020   VD25OH 43.8 10/23/2019   - Refill Vitamin D, Ergocalciferol, (DRISDOL) 1.25 MG (50000 UNIT) CAPS capsule; Take 1 capsule (50,000 Units total) by mouth every 7 (seven) days.  Dispense: 4 capsule; Refill: 0 - VITAMIN D 25 Hydroxy (Vit-D Deficiency, Fractures)  3. Hyperthyroidism Canesha is taking methimazole 5 mg daily. Followed by Endocrinology.  Plan:  Will check labs today, as per below.  Lab Results  Component Value Date   TSH  <0.005 (L) 03/25/2021   - TSH - T4, free - T3, free  4. B12 deficiency Lab Results  Component Value Date   [id number] 10/23/2019   Supplementation: None.   Plan:  Check vitamin B12 level today.    - Vitamin B12  5. Obesity, current BMI 27.9  Course: [name] is currently in the action stage of change. As such, her goal is to continue with weight loss efforts.   Nutrition goals: She has agreed to the Category 2 Plan.   Exercise goals:  As tolerated.  Behavioral modification strategies: increasing lean protein intake, decreasing simple carbohydrates, increasing vegetables, increasing water intake, and decreasing liquid calories.  [name] has agreed to follow-up with our clinic in 6 weeks. She was informed of the importance of frequent follow-up visits to maximize her success with intensive lifestyle modifications for her multiple health conditions.   [name] was informed we would discuss her lab results at her next visit unless there is a critical issue that needs to be addressed sooner. [name] agreed to keep her next visit at the agreed upon time to discuss these results.  Objective:   Blood pressure 133/75, pulse (!) 54, temperature 98.4 F (36.9 C), temperature source Oral, height 5\' 4"  (1.626 m), weight 162 lb (73.5 kg), SpO2 99 %. Body mass index is 27.81 kg/m.  General: Cooperative, alert, well developed, in no acute distress. HEENT: Conjunctivae and lids unremarkable. Cardiovascular: Regular rhythm.  Lungs: Normal work of breathing. Neurologic: No focal deficits.   Lab Results  Component Value Date   CREATININE 0.58 03/25/2021   BUN 12 03/25/2021   NA 139  03/25/2021   K 4.2 03/25/2021   CL 104 03/25/2021   CO2 21 03/25/2021   Lab Results  Component Value Date   ALT 15 03/25/2021   AST 18 03/25/2021   ALKPHOS 58 03/25/2021   BILITOT 0.6 03/25/2021   Lab Results  Component Value Date   HGBA1C 5.6 08/06/2020   HGBA1C 5.5 07/03/2019   Lab Results   Component Value Date   INSULIN 6.5 03/25/2021   INSULIN 4.0 08/06/2020   INSULIN 5.9 10/23/2019   INSULIN 12.2 07/03/2019   Lab Results  Component Value Date   TSH <0.005 (L) 03/25/2021   Lab Results  Component Value Date   CHOL 120 03/25/2021   HDL 32 (L) 03/25/2021   LDLCALC 63 03/25/2021   TRIG 143 03/25/2021   CHOLHDL 3.8 03/25/2021   Lab Results  Component Value Date   VD25OH 52.0 03/25/2021   VD25OH 40.1 08/06/2020   VD25OH 43.8 10/23/2019   Lab Results  Component Value Date   WBC 4.8 07/14/2021   HGB 14.1 07/14/2021   HCT 42.4 07/14/2021   MCV 69.4 (L) 07/14/2021   PLT 214 07/14/2021   Lab Results  Component Value Date   IRON 109 07/14/2021   TIBC 424 07/14/2021   FERRITIN 8 (L) 07/14/2021   Attestation Statements:   Reviewed by clinician on day of visit: allergies, medications, problem list, medical history, surgical history, family history, social history, and previous encounter notes.  I, Water quality scientist, CMA, am acting as transcriptionist for Briscoe Deutscher, DO  I have reviewed the above documentation for accuracy and completeness, and I agree with the above. -  Briscoe Deutscher, DO, MS, FAAFP, DABOM - Family and Bariatric Medicine.

## 2021-08-07 ENCOUNTER — Encounter (INDEPENDENT_AMBULATORY_CARE_PROVIDER_SITE_OTHER): Payer: Self-pay | Admitting: Family Medicine

## 2021-08-07 LAB — T4, FREE: Free T4: 0.86 ng/dL (ref 0.82–1.77)

## 2021-08-07 LAB — TSH: TSH: 1.43 u[IU]/mL (ref 0.450–4.500)

## 2021-08-07 LAB — T3, FREE: T3, Free: 2.5 pg/mL (ref 2.0–4.4)

## 2021-08-07 LAB — VITAMIN B12: Vitamin B-12: 583 pg/mL (ref 232–1245)

## 2021-08-07 LAB — VITAMIN D 25 HYDROXY (VIT D DEFICIENCY, FRACTURES): Vit D, 25-Hydroxy: 55.1 ng/mL (ref 30.0–100.0)

## 2021-08-27 ENCOUNTER — Telehealth (INDEPENDENT_AMBULATORY_CARE_PROVIDER_SITE_OTHER): Payer: BC Managed Care – PPO | Admitting: Family Medicine

## 2021-09-16 ENCOUNTER — Other Ambulatory Visit (INDEPENDENT_AMBULATORY_CARE_PROVIDER_SITE_OTHER): Payer: Self-pay | Admitting: Family Medicine

## 2021-09-16 DIAGNOSIS — E559 Vitamin D deficiency, unspecified: Secondary | ICD-10-CM

## 2021-09-23 ENCOUNTER — Ambulatory Visit (INDEPENDENT_AMBULATORY_CARE_PROVIDER_SITE_OTHER): Payer: BC Managed Care – PPO | Admitting: Family Medicine

## 2021-10-14 ENCOUNTER — Inpatient Hospital Stay: Payer: BC Managed Care – PPO

## 2021-10-14 ENCOUNTER — Inpatient Hospital Stay: Payer: BC Managed Care – PPO | Admitting: Family

## 2021-10-15 ENCOUNTER — Ambulatory Visit (INDEPENDENT_AMBULATORY_CARE_PROVIDER_SITE_OTHER): Payer: BC Managed Care – PPO | Admitting: Family Medicine

## 2021-10-15 ENCOUNTER — Encounter (INDEPENDENT_AMBULATORY_CARE_PROVIDER_SITE_OTHER): Payer: Self-pay

## 2021-10-17 ENCOUNTER — Inpatient Hospital Stay: Payer: BC Managed Care – PPO | Attending: Hematology & Oncology

## 2021-10-17 ENCOUNTER — Inpatient Hospital Stay: Payer: BC Managed Care – PPO | Admitting: Family

## 2021-10-17 ENCOUNTER — Other Ambulatory Visit: Payer: Self-pay

## 2021-10-17 ENCOUNTER — Encounter: Payer: Self-pay | Admitting: Family

## 2021-10-17 VITALS — BP 133/99 | HR 79 | Temp 98.9°F | Resp 17 | Wt 169.8 lb

## 2021-10-17 DIAGNOSIS — D509 Iron deficiency anemia, unspecified: Secondary | ICD-10-CM | POA: Diagnosis not present

## 2021-10-17 DIAGNOSIS — D573 Sickle-cell trait: Secondary | ICD-10-CM | POA: Diagnosis not present

## 2021-10-17 DIAGNOSIS — D500 Iron deficiency anemia secondary to blood loss (chronic): Secondary | ICD-10-CM | POA: Insufficient documentation

## 2021-10-17 DIAGNOSIS — D563 Thalassemia minor: Secondary | ICD-10-CM

## 2021-10-17 DIAGNOSIS — N920 Excessive and frequent menstruation with regular cycle: Secondary | ICD-10-CM | POA: Diagnosis present

## 2021-10-17 LAB — RETICULOCYTES
Immature Retic Fract: 16.1 % — ABNORMAL HIGH (ref 2.3–15.9)
RBC.: 5.76 MIL/uL — ABNORMAL HIGH (ref 3.87–5.11)
Retic Count, Absolute: 66.8 10*3/uL (ref 19.0–186.0)
Retic Ct Pct: 1.2 % (ref 0.4–3.1)

## 2021-10-17 LAB — CBC WITH DIFFERENTIAL (CANCER CENTER ONLY)
Abs Immature Granulocytes: 0.07 10*3/uL (ref 0.00–0.07)
Basophils Absolute: 0 10*3/uL (ref 0.0–0.1)
Basophils Relative: 1 %
Eosinophils Absolute: 0 10*3/uL (ref 0.0–0.5)
Eosinophils Relative: 1 %
HCT: 40 % (ref 36.0–46.0)
Hemoglobin: 13.5 g/dL (ref 12.0–15.0)
Immature Granulocytes: 1 %
Lymphocytes Relative: 24 %
Lymphs Abs: 1.4 10*3/uL (ref 0.7–4.0)
MCH: 23.9 pg — ABNORMAL LOW (ref 26.0–34.0)
MCHC: 33.8 g/dL (ref 30.0–36.0)
MCV: 70.8 fL — ABNORMAL LOW (ref 80.0–100.0)
Monocytes Absolute: 0.5 10*3/uL (ref 0.1–1.0)
Monocytes Relative: 8 %
Neutro Abs: 3.9 10*3/uL (ref 1.7–7.7)
Neutrophils Relative %: 65 %
Platelet Count: 262 10*3/uL (ref 150–400)
RBC: 5.65 MIL/uL — ABNORMAL HIGH (ref 3.87–5.11)
RDW: 13.8 % (ref 11.5–15.5)
WBC Count: 5.9 10*3/uL (ref 4.0–10.5)
nRBC: 0 % (ref 0.0–0.2)

## 2021-10-17 NOTE — Progress Notes (Signed)
?Hematology and Oncology Follow Up Visit ? ?Shyla Gayheart ?[id number] ?Aug 05, 1971 51 y.o. ?10/17/2021 ? ? ?Principle Diagnosis:  ?Iron deficiency anemia secondary to heavy cycle, uterine fibroids ?Sickle cell trait ?Alpha thalassemia minor trait ?  ?Current Therapy:        ?IV iron as indicated ?Folic acid 1 mg PO daily ?  ?Interim History:  Ms. Alcoser is here today for follow-up. She is doing well but notes some fatigue at times.  ?She has not noted any blood loss. No bruising or petechiae.  ?No fever, chills, n/v, cough, dizziness, SOB, chest pain, palpitations, abdominal pain or changes in bowel or bladder habits.  ?No numbness or tingling in her extremities at this time.  ?She does have some swollen red spots, one on each shin that are painful at times. No swelling noted in her lower extremities. Pedal pulses are 2+.  ?She states that she has an appointment with her PCP for further evaluation.  ?No falls or syncope to report.  ?She is eating well and doing her best to stay well hydrated. Her weight is stable at 169 lbs.  ? ?ECOG Performance Status: 1 - Symptomatic but completely ambulatory ? ?Medications:  ?Allergies as of 10/17/2021   ? ?   Reactions  ? Azithromycin Itching  ? ?  ? ?  ?Medication List  ?  ? ?  ? Accurate as of October 17, 2021  3:22 PM. If you have any questions, ask your nurse or doctor.  ?  ?  ? ?  ? ?amLODipine 10 MG tablet ?Commonly known as: NORVASC ?Take 10 mg by mouth daily. ?  ?folic acid 1 MG tablet ?Commonly known as: FOLVITE ?Take 1 tablet (1 mg total) by mouth daily. ?  ?LORazepam 0.5 MG tablet ?Commonly known as: ATIVAN ?Take 0.5 mg by mouth daily as needed. For anxiety. ?  ?methimazole 5 MG tablet ?Commonly known as: TAPAZOLE ?Take 5 mg by mouth daily. ?  ?metoprolol succinate 25 MG 24 hr tablet ?Commonly known as: TOPROL-XL ?Take 25 mg by mouth daily. ?  ?Vitamin D (Ergocalciferol) 1.25 MG (50000 UNIT) Caps capsule ?Commonly known as: DRISDOL ?TAKE 1 CAPSULE (50,000 UNITS  TOTAL) BY MOUTH EVERY 7 (SEVEN) DAYS ?  ? ?  ? ? ?Allergies:  ?Allergies  ?Allergen Reactions  ? Azithromycin Itching  ? ? ?Past Medical History, Surgical history, Social history, and Family History were reviewed and updated. ? ?Review of Systems: ?All other 10 point review of systems is negative.  ? ?Physical Exam: ? weight is 169 lb 12.8 oz (77 kg). Her oral temperature is 98.9 ?F (37.2 ?C). Her blood pressure is 133/99 (abnormal) and her pulse is 79. Her respiration is 17 and oxygen saturation is 100%.  ? ?Wt Readings from Last 3 Encounters:  ?10/17/21 169 lb 12.8 oz (77 kg)  ?08/06/21 162 lb (73.5 kg)  ?07/14/21 163 lb 12.8 oz (74.3 kg)  ? ? ?Ocular: Sclerae unicteric, pupils equal, round and reactive to light ?Ear-nose-throat: Oropharynx clear, dentition fair ?Lymphatic: No cervical or supraclavicular adenopathy ?Lungs no rales or rhonchi, good excursion bilaterally ?Heart regular rate and rhythm, no murmur appreciated ?Abd soft, nontender, positive bowel sounds ?MSK no focal spinal tenderness, no joint edema ?Neuro: non-focal, well-oriented, appropriate affect ?Breasts: Deferred  ? ?Lab Results  ?Component Value Date  ? WBC 5.9 10/17/2021  ? HGB 13.5 10/17/2021  ? HCT 40.0 10/17/2021  ? MCV 70.8 (L) 10/17/2021  ? PLT 262 10/17/2021  ? ?Lab Results  ?Component Value  Date  ? FERRITIN 8 (L) 07/14/2021  ? IRON 109 07/14/2021  ? TIBC 424 07/14/2021  ? UIBC 315 07/14/2021  ? IRONPCTSAT 26 07/14/2021  ? ?Lab Results  ?Component Value Date  ? RETICCTPCT 1.2 10/17/2021  ? RBC 5.76 (H) 10/17/2021  ? ?No results found for: KPAFRELGTCHN, LAMBDASER, KAPLAMBRATIO ?No results found for: IGGSERUM, IGA, IGMSERUM ?No results found for: TOTALPROTELP, ALBUMINELP, A1GS, A2GS, BETS, BETA2SER, GAMS, MSPIKE, SPEI ?  Chemistry   ?   ?Component Value Date/Time  ? NA 139 03/25/2021 1113  ? K 4.2 03/25/2021 1113  ? CL 104 03/25/2021 1113  ? CO2 21 03/25/2021 1113  ? BUN 12 03/25/2021 1113  ? CREATININE 0.58 03/25/2021 1113  ? CREATININE  0.70 03/10/2021 0854  ? CREATININE 0.71 02/19/2021 1119  ?    ?Component Value Date/Time  ? CALCIUM 9.2 03/25/2021 1113  ? ALKPHOS 58 03/25/2021 1113  ? AST 18 03/25/2021 1113  ? AST 25 03/10/2021 0854  ? ALT 15 03/25/2021 1113  ? ALT 24 03/10/2021 0854  ? BILITOT 0.6 03/25/2021 1113  ? BILITOT 0.7 03/10/2021 0854  ?  ? ? ? ?Impression and Plan: Ms. Beagley is a very pleasant 51 yo African American female with history of anemia secondary to heavy cycles, sickle cell trait and alpha thalassemia minor trait.  ?Iron studies are pending.  ?Follow-up in 3 months.  ? ?Lottie Dawson, NP ?3/3/20233:22 PM ? ?

## 2021-10-20 LAB — IRON AND IRON BINDING CAPACITY (CC-WL,HP ONLY)
Iron: 92 ug/dL (ref 28–170)
Saturation Ratios: 21 % (ref 10.4–31.8)
TIBC: 442 ug/dL (ref 250–450)
UIBC: 350 ug/dL (ref 148–442)

## 2021-10-20 LAB — FERRITIN: Ferritin: 5 ng/mL — ABNORMAL LOW (ref 11–307)

## 2021-10-23 ENCOUNTER — Telehealth: Payer: Self-pay | Admitting: *Deleted

## 2021-10-23 NOTE — Telephone Encounter (Signed)
Per 10/17/21 los - called and was unable to lvm - mailbox full - mailed calendar ?

## 2021-10-27 ENCOUNTER — Other Ambulatory Visit: Payer: Self-pay

## 2021-10-27 ENCOUNTER — Inpatient Hospital Stay: Payer: BC Managed Care – PPO

## 2021-10-27 VITALS — BP 135/87 | HR 69 | Temp 98.0°F | Resp 18

## 2021-10-27 DIAGNOSIS — D509 Iron deficiency anemia, unspecified: Secondary | ICD-10-CM

## 2021-10-27 DIAGNOSIS — D500 Iron deficiency anemia secondary to blood loss (chronic): Secondary | ICD-10-CM | POA: Diagnosis not present

## 2021-10-27 MED ORDER — SODIUM CHLORIDE 0.9 % IV SOLN
125.0000 mg | Freq: Once | INTRAVENOUS | Status: AC
  Administered 2021-10-27: 125 mg via INTRAVENOUS
  Filled 2021-10-27: qty 125

## 2021-10-27 MED ORDER — SODIUM CHLORIDE 0.9% FLUSH: 3.0000 mL | Freq: Once | INTRAVENOUS | Status: DC | PRN

## 2021-10-27 MED ORDER — SODIUM CHLORIDE 0.9% FLUSH: 10.0000 mL | Freq: Once | INTRAVENOUS | Status: DC | PRN

## 2021-10-27 MED ORDER — SODIUM CHLORIDE 0.9 % IV SOLN: Freq: Once | INTRAVENOUS | Status: AC

## 2021-10-27 NOTE — Patient Instructions (Signed)

## 2021-11-03 ENCOUNTER — Ambulatory Visit: Payer: BC Managed Care – PPO

## 2021-11-04 ENCOUNTER — Inpatient Hospital Stay: Payer: BC Managed Care – PPO

## 2021-11-04 ENCOUNTER — Other Ambulatory Visit: Payer: Self-pay

## 2021-11-04 VITALS — BP 118/80 | HR 78 | Temp 98.6°F | Resp 17

## 2021-11-04 DIAGNOSIS — D509 Iron deficiency anemia, unspecified: Secondary | ICD-10-CM

## 2021-11-04 DIAGNOSIS — D500 Iron deficiency anemia secondary to blood loss (chronic): Secondary | ICD-10-CM | POA: Diagnosis not present

## 2021-11-04 MED ORDER — SODIUM CHLORIDE 0.9 % IV SOLN: Freq: Once | INTRAVENOUS | Status: AC

## 2021-11-04 MED ORDER — SODIUM CHLORIDE 0.9 % IV SOLN
125.0000 mg | Freq: Once | INTRAVENOUS | Status: AC
  Administered 2021-11-04: 125 mg via INTRAVENOUS
  Filled 2021-11-04: qty 125

## 2021-11-04 NOTE — Patient Instructions (Signed)
Iron Deficiency Anemia, Adult Iron deficiency anemia is when you do not have enough red blood cells or hemoglobin in your blood. This happens because you have too little iron in your body. Hemoglobin carries oxygen to parts of the body. Anemia can cause yourbody to not get enough oxygen. What are the causes? Not eating enough foods that have iron in them. The body not being able to take in iron well. Needing more iron due to pregnancy or heavy menstrual periods, for females. Cancer. Bleeding in the bowels. Many blood draws. What increases the risk? Being pregnant. Being a teenage girl going through a growth spurt. What are the signs or symptoms? Pale skin, lips, and nails. Weakness, dizziness, and getting tired easily. Headache. Feeling like you cannot breathe well when moving (shortness of breath). Cold hands and feet. Fast heartbeat or a heartbeat that is not regular. Feeling grouchy (irritable) or breathing fast. These are more common in very bad anemia. Mild anemia may not cause any symptoms. How is this treated? This condition is treated by finding out why you do not have enough iron and then getting more iron. It may include: Adding foods to your diet that have a lot of iron. Taking iron pills (supplements). If you are pregnant or breastfeeding, you may need to take extra iron. Your diet often does not provide the amount of iron that you need. Getting more vitamin C in your diet. Vitamin C helps your body take in iron. You may need to take iron pills with a glass of orange juice or vitamin C pills. Medicines to make heavy menstrual periods lighter. Surgery. You may need blood tests to see if treatment is working. If the treatment doesnot seem to be working, you may need more tests. Follow these instructions at home: Medicines Take over-the-counter and prescription medicines only as told by your doctor. This includes iron pills and vitamins. Take iron pills when your stomach is  empty. If you cannot handle this, take them with food. Do not drink milk or take antacids at the same time as your iron pills. Iron pills may turn your poop (stool)black. If you cannot handle taking iron pills by mouth, ask your doctor about getting iron through: An IV tube. A shot (injection) into a muscle. Eating and drinking  Talk with your doctor before changing the foods you eat. He or she may tell you to eat foods that have a lot of iron, such as: Liver. Low-fat (lean) beef. Breads and cereals that have iron added to them. Eggs. Dried fruit. Dark green, leafy vegetables. Eat fresh fruits and vegetables that are high in vitamin C. They help your body use iron. Foods with a lot of vitamin C include: Oranges. Peppers. Tomatoes. Mangoes. Drink enough fluid to keep your pee (urine) pale yellow.  Managing constipation If you are taking iron pills, they may cause trouble pooping (constipation). To prevent or treat trouble pooping, you may need to: Take over-the-counter or prescription medicines. Eat foods that are high in fiber. These include beans, whole grains, and fresh fruits and vegetables. Limit foods that are high in fat and sugar. These include fried or sweet foods. General instructions Return to your normal activities as told by your doctor. Ask your doctor what activities are safe for you. Keep yourself clean, and keep things clean around you. Keep all follow-up visits as told by your doctor. This is important. Contact a doctor if: You feel like you may vomit (nauseous), or you vomit. You feel   weak. You are sweating for no reason. You have trouble pooping, such as: Pooping less than 3 times a week. Straining to poop. Having poop that is hard, dry, or larger than normal. Feeling full or bloated. Pain in the lower belly. Not feeling better after pooping. Get help right away if: You pass out (faint). You have chest pain. You have trouble breathing that: Is very  bad. Gets worse with physical activity. You have a fast heartbeat, or a heartbeat that does not feel regular. You get light-headed when getting up from sitting or lying down. These symptoms may be an emergency. Do not wait to see if the symptoms will go away. Get medical help right away. Call your local emergency services (911 in the [place] Do not drive yourself to the hospital. Summary Iron deficiency anemia is when you have too little iron in your body. This condition is treated by finding out why you do not have enough iron in your body and then getting more iron. Take over-the-counter and prescription medicines only as told by your doctor. Eat fresh fruits and vegetables that are high in vitamin C. Get help right away if you cannot breathe well. This information is not intended to replace advice given to you by your health care provider. Make sure you discuss any questions you have with your healthcare provider. Document Revised: 04/11/2019 Document Reviewed: 04/11/2019 Elsevier Patient Education  2022 [employer] Inc.  

## 2021-11-19 ENCOUNTER — Ambulatory Visit (INDEPENDENT_AMBULATORY_CARE_PROVIDER_SITE_OTHER): Payer: BC Managed Care – PPO | Admitting: Family Medicine

## 2021-11-19 ENCOUNTER — Encounter (INDEPENDENT_AMBULATORY_CARE_PROVIDER_SITE_OTHER): Payer: Self-pay | Admitting: Family Medicine

## 2021-11-19 VITALS — BP 119/76 | HR 66 | Temp 97.9°F | Ht 64.0 in | Wt 168.0 lb

## 2021-11-19 DIAGNOSIS — R632 Polyphagia: Secondary | ICD-10-CM | POA: Diagnosis not present

## 2021-11-19 DIAGNOSIS — E559 Vitamin D deficiency, unspecified: Secondary | ICD-10-CM | POA: Diagnosis not present

## 2021-11-19 DIAGNOSIS — E669 Obesity, unspecified: Secondary | ICD-10-CM

## 2021-11-19 DIAGNOSIS — E0590 Thyrotoxicosis, unspecified without thyrotoxic crisis or storm: Secondary | ICD-10-CM

## 2021-11-19 DIAGNOSIS — M064 Inflammatory polyarthropathy: Secondary | ICD-10-CM | POA: Diagnosis not present

## 2021-11-19 DIAGNOSIS — Z6828 Body mass index (BMI) 28.0-28.9, adult: Secondary | ICD-10-CM

## 2021-11-19 MED ORDER — WEGOVY 0.25 MG/0.5ML ~~LOC~~ SOAJ: 0.2500 mg | SUBCUTANEOUS | 1 refills | Status: DC

## 2021-11-19 MED ORDER — VITAMIN D (ERGOCALCIFEROL) 1.25 MG (50000 UNIT) PO CAPS: 50000.0000 [IU] | ORAL_CAPSULE | ORAL | 0 refills | Status: AC

## 2021-11-25 ENCOUNTER — Telehealth (INDEPENDENT_AMBULATORY_CARE_PROVIDER_SITE_OTHER): Payer: Self-pay | Admitting: Family Medicine

## 2021-11-25 ENCOUNTER — Encounter (INDEPENDENT_AMBULATORY_CARE_PROVIDER_SITE_OTHER): Payer: Self-pay

## 2021-11-25 NOTE — Telephone Encounter (Signed)
Prior authorization approved for [facility]. Effective: 11/19/2021 - 06/21/2022. Patient sent approval message via mychart.  ?

## 2021-11-25 NOTE — Progress Notes (Signed)
Chief Complaint:   OBESITY Jamie Barajas is here to discuss her progress with her obesity treatment plan along with follow-up of her obesity related diagnoses. See Medical Weight Management Flowsheet for complete bioelectrical impedance results.  Today's visit was #: 24 Starting weight: 186 lbs Starting date: 07/03/2019 Weight change since last visit: +6 lbs Total lbs lost to date: 18 lbs Total weight loss percentage to date: -9.68%  Nutrition Plan: Category 2 Plan for 50% of the time.  Activity: Aerobics/walking for 30 minutes 2 times per week.  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  Rheumatology recommends methotrexate or Humira.  She is still taking methimazole and metoprolol for hyperparathyroidism.  Assessment/Plan:   1. Polyphagia Not at goal. Current treatment: None.    Plan: Start Wegovy 0.25 mg subcutaneously weekly.  She will continue to focus on protein-rich, low simple carbohydrate foods. We reviewed the importance of hydration, regular exercise for stress reduction, and restorative sleep.  - Start Semaglutide-Weight Management (WEGOVY) 0.25 MG/0.5ML SOAJ; Inject 0.25 mg into the skin once a week.  Dispense: 2 mL; Refill: 1  2. Hyperthyroidism Jamie Barajas is taking methimazole 5 mg daily. We will continue to monitor symptoms as they relate to her weight loss journey.  Lab Results  Component Value Date   TSH 1.430 08/06/2021   3. Inflammatory polyarthritis ([place]) Followed by Rheumatology.  Recommended to start either methotrexate or Humira.  4. Vitamin D deficiency At goal. She is taking vitamin D 50,000 IU weekly.  Plan: Continue to take prescription Vitamin D '@50'$ ,000 IU every week as prescribed.  Follow-up for routine testing of Vitamin D, at least 2-3 times per year to avoid over-replacement.  Lab Results  Component Value Date   VD25OH 55.1 08/06/2021   VD25OH 52.0 03/25/2021   VD25OH 40.1 08/06/2020    - Vitamin D,  Ergocalciferol, (DRISDOL) 1.25 MG (50000 UNIT) CAPS capsule; Take 1 capsule (50,000 Units total) by mouth every 7 (seven) days.  Dispense: 4 capsule; Refill: 0  5. Obesity, current BMI 28.9  - Semaglutide-Weight Management (WEGOVY) 0.25 MG/0.5ML SOAJ; Inject 0.25 mg into the skin once a week.  Dispense: 2 mL; Refill: 1  Course: [name] is currently in the action stage of change. As such, her goal is to continue with weight loss efforts.   Nutrition goals: She has agreed to practicing portion control and making smarter food choices, such as increasing vegetables and decreasing simple carbohydrates.   Exercise goals:  As is.  Behavioral modification strategies: increasing lean protein intake, decreasing simple carbohydrates, increasing vegetables, and increasing water intake.  Sherryn has agreed to follow-up with our clinic in 6 weeks. She was informed of the importance of frequent follow-up visits to maximize her success with intensive lifestyle modifications for her multiple health conditions.   Objective:   Blood pressure 119/76, pulse 66, temperature 97.9 F (36.6 C), temperature source Oral, height '5\' 4"'$  (1.626 m), weight 168 lb (76.2 kg), SpO2 98 %. Body mass index is 28.84 kg/m.  General: Cooperative, alert, well developed, in no acute distress. HEENT: Conjunctivae and lids unremarkable. Cardiovascular: Regular rhythm.  Lungs: Normal work of breathing. Neurologic: No focal deficits.   Lab Results  Component Value Date   CREATININE 0.58 03/25/2021   BUN 12 03/25/2021   NA 139 03/25/2021   K 4.2 03/25/2021   CL 104 03/25/2021   CO2 21 03/25/2021   Lab Results  Component Value Date   ALT 15 03/25/2021   AST 18 03/25/2021  ALKPHOS 58 03/25/2021   BILITOT 0.6 03/25/2021   Lab Results  Component Value Date   HGBA1C 5.6 08/06/2020   HGBA1C 5.5 07/03/2019   Lab Results  Component Value Date   INSULIN 6.5 03/25/2021   INSULIN 4.0 08/06/2020   INSULIN 5.9 10/23/2019    INSULIN 12.2 07/03/2019   Lab Results  Component Value Date   TSH 1.430 08/06/2021   Lab Results  Component Value Date   CHOL 120 03/25/2021   HDL 32 (L) 03/25/2021   LDLCALC 63 03/25/2021   TRIG 143 03/25/2021   CHOLHDL 3.8 03/25/2021   Lab Results  Component Value Date   VD25OH 55.1 08/06/2021   VD25OH 52.0 03/25/2021   VD25OH 40.1 08/06/2020   Lab Results  Component Value Date   WBC 5.9 10/17/2021   HGB 13.5 10/17/2021   HCT 40.0 10/17/2021   MCV 70.8 (L) 10/17/2021   PLT 262 10/17/2021   Lab Results  Component Value Date   IRON 92 10/17/2021   TIBC 442 10/17/2021   FERRITIN 5 (L) 10/17/2021   Attestation Statements:   Reviewed by clinician on day of visit: allergies, medications, problem list, medical history, surgical history, family history, social history, and previous encounter notes.  I, Water quality scientist, CMA, am acting as transcriptionist for Briscoe Deutscher, DO  I have reviewed the above documentation for accuracy and completeness, and I agree with the above. -  Briscoe Deutscher, DO, MS, FAAFP, DABOM - Family and Bariatric Medicine.

## 2022-01-16 ENCOUNTER — Inpatient Hospital Stay (HOSPITAL_BASED_OUTPATIENT_CLINIC_OR_DEPARTMENT_OTHER): Payer: BC Managed Care – PPO | Admitting: Family

## 2022-01-16 ENCOUNTER — Encounter: Payer: Self-pay | Admitting: Family

## 2022-01-16 ENCOUNTER — Inpatient Hospital Stay: Payer: BC Managed Care – PPO | Attending: Hematology & Oncology

## 2022-01-16 ENCOUNTER — Other Ambulatory Visit: Payer: Self-pay

## 2022-01-16 VITALS — BP 115/80 | HR 68 | Temp 97.7°F | Resp 18 | Ht 64.0 in | Wt 173.4 lb

## 2022-01-16 DIAGNOSIS — N920 Excessive and frequent menstruation with regular cycle: Secondary | ICD-10-CM | POA: Diagnosis present

## 2022-01-16 DIAGNOSIS — R5383 Other fatigue: Secondary | ICD-10-CM | POA: Diagnosis not present

## 2022-01-16 DIAGNOSIS — D259 Leiomyoma of uterus, unspecified: Secondary | ICD-10-CM | POA: Diagnosis not present

## 2022-01-16 DIAGNOSIS — D509 Iron deficiency anemia, unspecified: Secondary | ICD-10-CM | POA: Diagnosis not present

## 2022-01-16 DIAGNOSIS — D563 Thalassemia minor: Secondary | ICD-10-CM | POA: Diagnosis not present

## 2022-01-16 DIAGNOSIS — D573 Sickle-cell trait: Secondary | ICD-10-CM | POA: Insufficient documentation

## 2022-01-16 DIAGNOSIS — D500 Iron deficiency anemia secondary to blood loss (chronic): Secondary | ICD-10-CM | POA: Insufficient documentation

## 2022-01-16 LAB — CBC WITH DIFFERENTIAL (CANCER CENTER ONLY)
Abs Immature Granulocytes: 0.05 10*3/uL (ref 0.00–0.07)
Basophils Absolute: 0 10*3/uL (ref 0.0–0.1)
Basophils Relative: 0 %
Eosinophils Absolute: 0.1 10*3/uL (ref 0.0–0.5)
Eosinophils Relative: 1 %
HCT: 41.6 % (ref 36.0–46.0)
Hemoglobin: 13.6 g/dL (ref 12.0–15.0)
Immature Granulocytes: 1 %
Lymphocytes Relative: 29 %
Lymphs Abs: 1.3 10*3/uL (ref 0.7–4.0)
MCH: 23.6 pg — ABNORMAL LOW (ref 26.0–34.0)
MCHC: 32.7 g/dL (ref 30.0–36.0)
MCV: 72.2 fL — ABNORMAL LOW (ref 80.0–100.0)
Monocytes Absolute: 0.5 10*3/uL (ref 0.1–1.0)
Monocytes Relative: 10 %
Neutro Abs: 2.8 10*3/uL (ref 1.7–7.7)
Neutrophils Relative %: 59 %
Platelet Count: 236 10*3/uL (ref 150–400)
RBC: 5.76 MIL/uL — ABNORMAL HIGH (ref 3.87–5.11)
RDW: 14.9 % (ref 11.5–15.5)
WBC Count: 4.7 10*3/uL (ref 4.0–10.5)
nRBC: 0 % (ref 0.0–0.2)

## 2022-01-16 LAB — FERRITIN: Ferritin: 12 ng/mL (ref 11–307)

## 2022-01-16 LAB — RETICULOCYTES
Immature Retic Fract: 12 % (ref 2.3–15.9)
RBC.: 5.66 MIL/uL — ABNORMAL HIGH (ref 3.87–5.11)
Retic Count, Absolute: 49.8 10*3/uL (ref 19.0–186.0)
Retic Ct Pct: 0.9 % (ref 0.4–3.1)

## 2022-01-16 NOTE — Progress Notes (Addendum)
Hematology and Oncology Follow Up Visit  Jamie Barajas [id number] 12-Oct-1970 51 y.o. 01/16/2022   Principle Diagnosis:  Iron deficiency anemia secondary to heavy cycle, uterine fibroids Sickle cell trait Alpha thalassemia minor trait   Current Therapy:        IV iron as indicated Folic acid 1 mg PO daily   Interim History:  Ms. Jamie Barajas is here today for follow-up. She is doing well but does note occasional fatigue at times.  Her cycle remains quite heavy. No other blood loss noted. No bruising or petechiae.  She is scheduled with gynecology to discuss possible AUB on 6/13. Hopefully this will make a significant improvement in her cycle.  No fever, chills, n/v, cough, rash, dizziness, SOB, chest pain, palpitations, abdominal pain or changes in bowel or bladder habits.  No swelling, tenderness, numbness or tingling in her extremities.  No falls or syncope.  Appetite and hydration are good. Weight is stable at 173 lbs.   ECOG Performance Status: 1 - Symptomatic but completely ambulatory  Medications:  Allergies as of 01/16/2022       Reactions   Azithromycin Itching        Medication List        Accurate as of January 16, 2022  2:33 PM. If you have any questions, ask your nurse or doctor.          STOP taking these medications    Wegovy 0.25 MG/0.5ML Soaj Generic drug: Semaglutide-Weight Management Stopped by: Lottie Dawson, NP       TAKE these medications    amLODipine 10 MG tablet Commonly known as: NORVASC Take 10 mg by mouth daily.   folic acid 1 MG tablet Commonly known as: FOLVITE Take 1 tablet (1 mg total) by mouth daily.   LORazepam 0.5 MG tablet Commonly known as: ATIVAN Take 0.5 mg by mouth daily as needed. For anxiety.   methimazole 5 MG tablet Commonly known as: TAPAZOLE Take 5 mg by mouth daily.   metoprolol succinate 25 MG 24 hr tablet Commonly known as: TOPROL-XL Take 25 mg by mouth daily.   Vitamin D (Ergocalciferol) 1.25  MG (50000 UNIT) Caps capsule Commonly known as: DRISDOL Take 1 capsule (50,000 Units total) by mouth every 7 (seven) days.        Allergies:  Allergies  Allergen Reactions   Azithromycin Itching    Past Medical History, Surgical history, Social history, and Family History were reviewed and updated.  Review of Systems: All other 10 point review of systems is negative.   Physical Exam:  height is '5\' 4"'$  (1.626 m) and weight is 173 lb 6.4 oz (78.7 kg). Her oral temperature is 97.7 F (36.5 C). Her blood pressure is 115/80 and her pulse is 68. Her respiration is 18 and oxygen saturation is 98%.   Wt Readings from Last 3 Encounters:  01/16/22 173 lb 6.4 oz (78.7 kg)  11/19/21 168 lb (76.2 kg)  10/17/21 169 lb 12.8 oz (77 kg)    Ocular: Sclerae unicteric, pupils equal, round and reactive to light Ear-nose-throat: Oropharynx clear, dentition fair Lymphatic: No cervical or supraclavicular adenopathy Lungs no rales or rhonchi, good excursion bilaterally Heart regular rate and rhythm, no murmur appreciated Abd soft, nontender, positive bowel sounds MSK no focal spinal tenderness, no joint edema Neuro: non-focal, well-oriented, appropriate affect Breasts: Deferred   Lab Results  Component Value Date   WBC 4.7 01/16/2022   HGB 13.6 01/16/2022   HCT 41.6 01/16/2022   MCV 72.2 (L) 01/16/2022  PLT 236 01/16/2022   Lab Results  Component Value Date   FERRITIN 5 (L) 10/17/2021   IRON 92 10/17/2021   TIBC 442 10/17/2021   UIBC 350 10/17/2021   IRONPCTSAT 21 10/17/2021   Lab Results  Component Value Date   RETICCTPCT 0.9 01/16/2022   RBC 5.66 (H) 01/16/2022   No results found for: KPAFRELGTCHN, LAMBDASER, KAPLAMBRATIO No results found for: IGGSERUM, IGA, IGMSERUM No results found for: Odetta Pink, SPEI   Chemistry      Component Value Date/Time   NA 139 03/25/2021 1113   K 4.2 03/25/2021 1113   CL 104 03/25/2021  1113   CO2 21 03/25/2021 1113   BUN 12 03/25/2021 1113   CREATININE 0.58 03/25/2021 1113   CREATININE 0.70 03/10/2021 0854   CREATININE 0.71 02/19/2021 1119      Component Value Date/Time   CALCIUM 9.2 03/25/2021 1113   ALKPHOS 58 03/25/2021 1113   AST 18 03/25/2021 1113   AST 25 03/10/2021 0854   ALT 15 03/25/2021 1113   ALT 24 03/10/2021 0854   BILITOT 0.6 03/25/2021 1113   BILITOT 0.7 03/10/2021 0854       Impression and Plan: [name] is a very pleasant 51 yo African American female with history of anemia secondary to heavy cycles, sickle cell trait and alpha thalassemia minor trait.  Iron studies are pending.  Follow-up in 3 months.   Lottie Dawson, NP 6/2/20232:33 PM

## 2022-01-19 LAB — IRON AND IRON BINDING CAPACITY (CC-WL,HP ONLY)
Iron: 98 ug/dL (ref 28–170)
Saturation Ratios: 26 % (ref 10.4–31.8)
TIBC: 382 ug/dL (ref 250–450)
UIBC: 284 ug/dL (ref 148–442)

## 2022-01-21 ENCOUNTER — Telehealth: Payer: Self-pay | Admitting: *Deleted

## 2022-01-21 NOTE — Telephone Encounter (Signed)
Per 01/16/22 los - called and lvm of upcoming appointments - requested callback to confirm

## 2022-01-26 ENCOUNTER — Other Ambulatory Visit: Payer: Self-pay | Admitting: Radiology

## 2022-01-26 DIAGNOSIS — D259 Leiomyoma of uterus, unspecified: Secondary | ICD-10-CM

## 2022-01-26 NOTE — H&P (Signed)
Chief Complaint: Patient was seen in consultation today for uterine leiomyoma  at the request of Hassell,Daniel  Referring Physician(s): Hassell,Daniel  Supervising Physician: Wallene Dales  Patient Status: [facility] - Out-pt  History of Present Illness: Jamie Barajas is a 51 y.o. female w/ PMH of anemia, atrial tachycardia, chest pain, uterine fibroids, HTN, menorrhagia with irregular cycle, prolonged QT, PTSD and SVT. Pt was referred by Sumner Boast, MD to IR for consult to discuss treatment options for symptomatic uterine fibroids. Pt met with Dr. Vernard Gambles 03/06/21 and decided to proceed with uterine fibroid embolization with moderate sedation and overnight observation.   Past Medical History:  Diagnosis Date   Abnormal EKG    Decreased anterior R wave progression, nonspecific ST-T wave changes   Anemia    Anxiety    Atrial tachycardia (HCC)    History of atrial tachycardia prior to 2013.   Bilateral swelling of feet    Chest pain    Nuclear, Dec 29, 2011, normal, EF 70%   Family history of breast cancer 02/09/2021   Fibroid    Fibroid uterus 02/09/2021   Fibroids    History of anemia 02/09/2021   Hypertension    Hyperthyroidism    2013, Patient seen extensively by endocrinology with treatment given. See hospital records   Menorrhagia with irregular cycle 02/09/2021   Prolonged QT interval    QT interval is normal when the T wave is clear   PTSD (post-traumatic stress disorder)    Shortness of breath    Echo, May, 2013, EF 65%, no valvular abnormalities.   Sickle cell trait (HCC)    SVT (supraventricular tachycardia) (HCC)    Uveitis    Vitamin B 12 deficiency    Vitamin D deficiency     Past Surgical History:  Procedure Laterality Date   cryosurgery of cervix     IR RADIOLOGIST EVAL & MGMT  03/24/2017   IR RADIOLOGIST EVAL & MGMT  03/06/2021   KNEE ARTHROSCOPY W/ INTERNAL FIXATION TIBIAL SPINE FRACTURE Right ~ 2011     Allergies: Azithromycin  Medications: Prior to Admission medications   Medication Sig Start Date End Date Taking? Authorizing Provider  amLODipine (NORVASC) 10 MG tablet Take 10 mg by mouth daily.    [provider]  folic acid (FOLVITE) 1 MG tablet Take 1 tablet (1 mg total) by mouth daily. 03/10/21   Celso Amy, NP  LORazepam (ATIVAN) 0.5 MG tablet Take 0.5 mg by mouth daily as needed. For anxiety.    [provider]  methimazole (TAPAZOLE) 5 MG tablet Take 5 mg by mouth daily.    [provider]  metoprolol succinate (TOPROL-XL) 25 MG 24 hr tablet Take 25 mg by mouth daily.    [provider]  Vitamin D, Ergocalciferol, (DRISDOL) 1.25 MG (50000 UNIT) CAPS capsule Take 1 capsule (50,000 Units total) by mouth every 7 (seven) days. 11/19/21   Briscoe Deutscher, DO     Family History  Problem Relation Age of Onset   Breast cancer Mother 65   Stroke Mother    Cancer Mother    Hypertension Mother    Sarcoidosis Mother    Stroke Father    Hypertension Father    Hyperlipidemia Father    Breast cancer Sister 41    Social History   Socioeconomic History   Marital status: Married    Spouse name: Not on file   Number of children: 2   Years of education: Not on file   Highest  education level: Not on file  Occupational History   Occupation: TEACHER    Employer: [employer]  Tobacco Use   Smoking status: Never   Smokeless tobacco: Never  Vaping Use   Vaping Use: Never used  Substance and Sexual Activity   Alcohol use: No    Comment: 12/15/11 "glass of wine maybe 3 times/month"   Drug use: No   Sexual activity: Yes    Birth control/protection: Condom  Other Topics Concern   Not on file  Social History Narrative   Lives with husband in [place], [place].             Social Determinants of Health   Financial Resource Strain: Not on file  Food Insecurity: Not on file  Transportation Needs: Not on file  Physical Activity: Not on  file  Stress: Not on file  Social Connections: Not on file    Review of Systems: A 12 point ROS discussed and pertinent positives are indicated in the HPI above.  All other systems are negative.  Review of Systems  Constitutional:  Positive for fatigue. Negative for chills and fever.  Respiratory:  Negative for shortness of breath.   Cardiovascular:  Negative for chest pain and leg swelling.  Gastrointestinal:  Negative for abdominal pain.  Neurological:  Negative for dizziness, weakness and headaches.    Vital Signs: BP (!) 157/96   Pulse 77   Temp 98.1 F (36.7 C) (Oral)   Resp 18   Ht $R'5\' 4"'UJ$  (1.626 m)   Wt 172 lb 13.5 oz (78.4 kg)   LMP 12/26/2021   SpO2 99%   BMI 29.67 kg/m   Physical Exam Vitals reviewed.  Constitutional:      General: She is not in acute distress.    Appearance: Normal appearance. She is not ill-appearing.  HENT:     Head: Normocephalic and atraumatic.     Mouth/Throat:     Mouth: Mucous membranes are moist.     Pharynx: Oropharynx is clear.  Eyes:     Extraocular Movements: Extraocular movements intact.     Pupils: Pupils are equal, round, and reactive to light.  Cardiovascular:     Rate and Rhythm: Normal rate and regular rhythm.     Pulses: Normal pulses.     Heart sounds: Normal heart sounds.  Pulmonary:     Effort: Pulmonary effort is normal.     Breath sounds: Normal breath sounds.  Abdominal:     General: Bowel sounds are normal. There is no distension.     Palpations: Abdomen is soft.     Tenderness: There is no abdominal tenderness. There is no guarding.  Musculoskeletal:     Right lower leg: No edema.     Left lower leg: No edema.  Skin:    General: Skin is warm and dry.  Neurological:     Mental Status: She is alert and oriented to person, place, and time.  Psychiatric:        Mood and Affect: Mood normal.        Behavior: Behavior normal.        Thought Content: Thought content normal.        Judgment: Judgment normal.      Imaging: No results found.  Labs:  CBC: Recent Labs    05/12/21 1351 07/14/21 1355 10/17/21 1420 01/16/22 1354  WBC 4.5 4.8 5.9 4.7  HGB 13.1 14.1 13.5 13.6  HCT 39.8 42.4 40.0 41.6  PLT 229 214 262 236  COAGS: Recent Labs    01/27/22 1033  INR 1.0    BMP: Recent Labs    02/19/21 1119 03/10/21 0854 03/25/21 1113  NA  --  137 139  K  --  3.7 4.2  CL  --  103 104  CO2  --  26 21  GLUCOSE  --  118* 78  BUN $Re'15 15 12  'rQv$ CALCIUM  --  9.7 9.2  CREATININE 0.71 0.70 0.58  GFRNONAA 99 >60  --   GFRAA 114  --   --     LIVER FUNCTION TESTS: Recent Labs    03/10/21 0854 03/25/21 1113  BILITOT 0.7 0.6  AST 25 18  ALT 24 15  ALKPHOS 56 58  PROT 7.0 6.6  ALBUMIN 4.1 4.0    TUMOR MARKERS: No results for input(s): "AFPTM", "CEA", "CA199", "CHROMGRNA" in the last 8760 hours.  Assessment and Plan: History of anemia, atrial tachycardia, chest pain, uterine fibroids, HTN, menorrhagia with irregular cycle, prolonged QT, PTSD and SVT. Pt was referred by Sumner Boast, MD to IR for consult to discuss treatment options for symptomatic uterine fibroids. Pt met with Dr. Vernard Gambles 03/06/21 and decided to proceed with uterine fibroid embolization with moderate sedation and overnight observation.   Pt watching TV on stretcher. She is A&O, calm and pleasant.  She is in no distress. Dr. Vernard Gambles at bedside to discuss procedure.  Pt is NPO per order.  She denies the use of AC/AP.  Today's labs pending.    Risks and benefits of procedure were discussed with the patient including, but not limited to bleeding, infection, vascular injury or contrast induced renal failure.   This interventional procedure involves the use of X-rays and because of the nature of the planned procedure, it is possible that we will have prolonged use of X-ray fluoroscopy.   Potential radiation risks to you include (but are not limited to) the following: - A slightly elevated risk for cancer several  years later in life. This risk is typically less than 0.5% percent. This risk is low in comparison to the normal incidence of human cancer, which is 33% for women and 50% for men according to the [place].  - Radiation induced injury can include skin redness, resembling a rash, tissue breakdown / ulcers and hair loss (which can be temporary or permanent).    The likelihood of either of these occurring depends on the difficulty of the procedure and whether you are sensitive to radiation due to previous procedures, disease, or genetic conditions.    IF your procedure requires a prolonged use of radiation, you will be notified and given written instructions for further action.  It is your responsibility to monitor the irradiated area for the 2 weeks following the procedure and to notify your physician if you are concerned that you have suffered a radiation induced injury.     All of the patient's questions were answered, patient is agreeable to proceed.   Consent signed and in chart.   Thank you for this interesting consult.  I greatly enjoyed meeting Jamie Barajas and look forward to participating in their care.  A copy of this report was sent to the requesting provider on this date.  Electronically Signed: Tyson Alias, NP 01/27/2022, 11:45 AM   I spent a total of 20 minutes in face to face in clinical consultation, greater than 50% of which was counseling/coordinating care for uterine leiomyoma.

## 2022-01-27 ENCOUNTER — Other Ambulatory Visit (HOSPITAL_COMMUNITY): Payer: Self-pay | Admitting: Interventional Radiology

## 2022-01-27 ENCOUNTER — Encounter (HOSPITAL_COMMUNITY): Payer: Self-pay

## 2022-01-27 ENCOUNTER — Other Ambulatory Visit: Payer: Self-pay

## 2022-01-27 ENCOUNTER — Observation Stay (HOSPITAL_COMMUNITY)
Admission: RE | Admit: 2022-01-27 | Discharge: 2022-01-28 | Disposition: A | Discharge status: Home / Self Care | Payer: BC Managed Care – PPO | Source: Ambulatory Visit | Attending: Interventional Radiology | Admitting: Interventional Radiology

## 2022-01-27 ENCOUNTER — Ambulatory Visit (HOSPITAL_COMMUNITY): Payer: BC Managed Care – PPO

## 2022-01-27 ENCOUNTER — Ambulatory Visit (HOSPITAL_COMMUNITY)
Admission: RE | Admit: 2022-01-27 | Discharge: 2022-01-27 | Disposition: A | Discharge status: Home / Self Care | Payer: BC Managed Care – PPO | Source: Ambulatory Visit | Attending: Interventional Radiology | Admitting: Interventional Radiology

## 2022-01-27 DIAGNOSIS — Z79899 Other long term (current) drug therapy: Secondary | ICD-10-CM | POA: Insufficient documentation

## 2022-01-27 DIAGNOSIS — D259 Leiomyoma of uterus, unspecified: Secondary | ICD-10-CM | POA: Diagnosis present

## 2022-01-27 DIAGNOSIS — N920 Excessive and frequent menstruation with regular cycle: Secondary | ICD-10-CM | POA: Diagnosis not present

## 2022-01-27 DIAGNOSIS — I10 Essential (primary) hypertension: Secondary | ICD-10-CM | POA: Diagnosis not present

## 2022-01-27 HISTORY — PX: IR ANGIOGRAM PELVIS SELECTIVE OR SUPRASELECTIVE: IMG661

## 2022-01-27 HISTORY — PX: IR EMBO TUMOR ORGAN ISCHEMIA INFARCT INC GUIDE ROADMAPPING: IMG5449

## 2022-01-27 HISTORY — PX: IR ANGIOGRAM SELECTIVE EACH ADDITIONAL VESSEL: IMG667

## 2022-01-27 HISTORY — PX: IR US GUIDE VASC ACCESS LEFT: IMG2389

## 2022-01-27 LAB — CBC WITH DIFFERENTIAL/PLATELET
Abs Immature Granulocytes: 0.02 10*3/uL (ref 0.00–0.07)
Basophils Absolute: 0 10*3/uL (ref 0.0–0.1)
Basophils Relative: 0 %
Eosinophils Absolute: 0.1 10*3/uL (ref 0.0–0.5)
Eosinophils Relative: 1 %
HCT: 44.5 % (ref 36.0–46.0)
Hemoglobin: 14.4 g/dL (ref 12.0–15.0)
Immature Granulocytes: 0 %
Lymphocytes Relative: 26 %
Lymphs Abs: 1.2 10*3/uL (ref 0.7–4.0)
MCH: 23.7 pg — ABNORMAL LOW (ref 26.0–34.0)
MCHC: 32.4 g/dL (ref 30.0–36.0)
MCV: 73.2 fL — ABNORMAL LOW (ref 80.0–100.0)
Monocytes Absolute: 0.3 10*3/uL (ref 0.1–1.0)
Monocytes Relative: 7 %
Neutro Abs: 3 10*3/uL (ref 1.7–7.7)
Neutrophils Relative %: 66 %
Platelets: 213 10*3/uL (ref 150–400)
RBC: 6.08 MIL/uL — ABNORMAL HIGH (ref 3.87–5.11)
RDW: 15.9 % — ABNORMAL HIGH (ref 11.5–15.5)
WBC: 4.6 10*3/uL (ref 4.0–10.5)
nRBC: 0 % (ref 0.0–0.2)

## 2022-01-27 LAB — BASIC METABOLIC PANEL
Anion gap: 8 (ref 5–15)
BUN: 19 mg/dL (ref 6–20)
CO2: 29 mmol/L (ref 22–32)
Calcium: 9.4 mg/dL (ref 8.9–10.3)
Chloride: 104 mmol/L (ref 98–111)
Creatinine, Ser: 0.8 mg/dL (ref 0.44–1.00)
GFR, Estimated: >60 mL/min (ref >60)
Glucose, Bld: 92 mg/dL (ref 70–99)
Potassium: 3.7 mmol/L (ref 3.5–5.1)
Sodium: 141 mmol/L (ref 135–145)

## 2022-01-27 LAB — TYPE AND SCREEN
ABO/RH(D): O POS
Antibody Screen: NEGATIVE

## 2022-01-27 LAB — PROTIME-INR
INR: 1 (ref 0.8–1.2)
Prothrombin Time: 12.9 seconds (ref 11.4–15.2)

## 2022-01-27 LAB — HCG, SERUM, QUALITATIVE: Preg, Serum: NEGATIVE

## 2022-01-27 MED ORDER — LIDOCAINE HCL (PF) 1 % IJ SOLN
INTRAMUSCULAR | Status: AC | PRN
  Administered 2022-01-27: 2 mL via INTRADERMAL

## 2022-01-27 MED ORDER — IOHEXOL 300 MG/ML  SOLN
100.0000 mL | Freq: Once | INTRAMUSCULAR | Status: AC | PRN
  Administered 2022-01-27: 50 mL via INTRA_ARTERIAL

## 2022-01-27 MED ORDER — NALOXONE HCL 0.4 MG/ML IJ SOLN: 0.4000 mg | INTRAMUSCULAR | Status: DC | PRN

## 2022-01-27 MED ORDER — LIDOCAINE-PRILOCAINE 2.5-2.5 % EX CREA
TOPICAL_CREAM | Freq: Once | CUTANEOUS | Status: AC
Start: 2022-01-27 — End: 2022-01-27
  Filled 2022-01-27: qty 5

## 2022-01-27 MED ORDER — NITROGLYCERIN IN D5W 100-5 MCG/ML-% IV SOLN
INTRAVENOUS | Status: AC
  Filled 2022-01-27: qty 250

## 2022-01-27 MED ORDER — LIDOCAINE HCL 1 % IJ SOLN
INTRAMUSCULAR | Status: AC
  Filled 2022-01-27: qty 20

## 2022-01-27 MED ORDER — NALOXONE HCL 0.4 MG/ML IJ SOLN
0.4000 mg | INTRAMUSCULAR | Status: DC | PRN
Start: 2022-01-27 — End: 2022-01-27

## 2022-01-27 MED ORDER — LORAZEPAM 2 MG/ML IJ SOLN: 0.5000 mg | Freq: Three times a day (TID) | INTRAMUSCULAR | Status: DC | PRN

## 2022-01-27 MED ORDER — FOLIC ACID 1 MG PO TABS
1.0000 mg | ORAL_TABLET | Freq: Every day | ORAL | Status: DC
  Administered 2022-01-28: 1 mg via ORAL
  Filled 2022-01-27: qty 1

## 2022-01-27 MED ORDER — DIPHENHYDRAMINE HCL 50 MG/ML IJ SOLN: 12.5000 mg | Freq: Four times a day (QID) | INTRAMUSCULAR | Status: DC | PRN

## 2022-01-27 MED ORDER — METHIMAZOLE 5 MG PO TABS
5.0000 mg | ORAL_TABLET | Freq: Every day | ORAL | Status: DC
  Administered 2022-01-28: 5 mg via ORAL
  Filled 2022-01-27: qty 1

## 2022-01-27 MED ORDER — IOHEXOL 300 MG/ML  SOLN
100.0000 mL | Freq: Once | INTRAMUSCULAR | Status: AC | PRN
  Administered 2022-01-27: 70 mL via INTRA_ARTERIAL

## 2022-01-27 MED ORDER — DOCUSATE SODIUM 100 MG PO CAPS
100.0000 mg | ORAL_CAPSULE | Freq: Two times a day (BID) | ORAL | Status: DC
  Administered 2022-01-27 – 2022-01-28 (×2): 100 mg via ORAL
  Filled 2022-01-27 (×2): qty 1

## 2022-01-27 MED ORDER — ONDANSETRON HCL 4 MG/2ML IJ SOLN: 4.0000 mg | Freq: Four times a day (QID) | INTRAMUSCULAR | Status: DC | PRN

## 2022-01-27 MED ORDER — IBUPROFEN 400 MG PO TABS
800.0000 mg | ORAL_TABLET | Freq: Four times a day (QID) | ORAL | Status: DC
  Administered 2022-01-27 – 2022-01-28 (×4): 800 mg via ORAL
  Filled 2022-01-27 (×4): qty 2

## 2022-01-27 MED ORDER — CEFAZOLIN SODIUM-DEXTROSE 2-4 GM/100ML-% IV SOLN
2.0000 g | INTRAVENOUS | Status: AC
  Filled 2022-01-27: qty 100

## 2022-01-27 MED ORDER — DIPHENHYDRAMINE HCL 12.5 MG/5ML PO ELIX: 12.5000 mg | ORAL_SOLUTION | Freq: Four times a day (QID) | ORAL | Status: DC | PRN

## 2022-01-27 MED ORDER — MIDAZOLAM HCL 2 MG/2ML IJ SOLN
INTRAMUSCULAR | Status: AC | PRN
  Administered 2022-01-27: 1 mg via INTRAVENOUS

## 2022-01-27 MED ORDER — HYDROMORPHONE 1 MG/ML IV SOLN
INTRAVENOUS | Status: DC
  Administered 2022-01-27: 2.1 mg via INTRAVENOUS
  Administered 2022-01-28: 0.3 mg via INTRAVENOUS
  Administered 2022-01-28: 1 mg via INTRAVENOUS

## 2022-01-27 MED ORDER — MIDAZOLAM HCL 2 MG/2ML IJ SOLN
INTRAMUSCULAR | Status: AC
  Filled 2022-01-27: qty 4

## 2022-01-27 MED ORDER — SODIUM CHLORIDE 0.9% FLUSH: 3.0000 mL | INTRAVENOUS | Status: DC | PRN

## 2022-01-27 MED ORDER — FENTANYL CITRATE (PF) 100 MCG/2ML IJ SOLN
INTRAMUSCULAR | Status: AC | PRN
  Administered 2022-01-27: 50 ug via INTRAVENOUS

## 2022-01-27 MED ORDER — LORAZEPAM 0.5 MG PO TABS
0.5000 mg | ORAL_TABLET | Freq: Every day | ORAL | Status: DC | PRN
  Administered 2022-01-28: 0.5 mg via ORAL
  Filled 2022-01-27: qty 1

## 2022-01-27 MED ORDER — VERAPAMIL HCL 2.5 MG/ML IV SOLN
INTRA_ARTERIAL | Status: AC | PRN
  Administered 2022-01-27: 6 mL via INTRA_ARTERIAL

## 2022-01-27 MED ORDER — HEPARIN SODIUM (PORCINE) 1000 UNIT/ML IJ SOLN
INTRAMUSCULAR | Status: AC
  Filled 2022-01-27: qty 10

## 2022-01-27 MED ORDER — DIPHENHYDRAMINE HCL 12.5 MG/5ML PO ELIX
12.5000 mg | ORAL_SOLUTION | Freq: Four times a day (QID) | ORAL | Status: DC | PRN
Start: 2022-01-27 — End: 2022-01-28

## 2022-01-27 MED ORDER — SCOPOLAMINE 1 MG/3DAYS TD PT72
1.0000 | MEDICATED_PATCH | TRANSDERMAL | Status: DC
  Filled 2022-01-27: qty 1

## 2022-01-27 MED ORDER — SODIUM CHLORIDE 0.9 % IV SOLN: INTRAVENOUS | Status: DC

## 2022-01-27 MED ORDER — METOPROLOL SUCCINATE ER 25 MG PO TB24
25.0000 mg | ORAL_TABLET | Freq: Every day | ORAL | Status: DC
  Administered 2022-01-28: 25 mg via ORAL
  Filled 2022-01-27: qty 1

## 2022-01-27 MED ORDER — HYDROMORPHONE 1 MG/ML IV SOLN
INTRAVENOUS | Status: DC
  Administered 2022-01-27: 30 mg via INTRAVENOUS
  Filled 2022-01-27 (×11): qty 30

## 2022-01-27 MED ORDER — FENTANYL CITRATE (PF) 100 MCG/2ML IJ SOLN
INTRAMUSCULAR | Status: AC
  Filled 2022-01-27: qty 6

## 2022-01-27 MED ORDER — AMLODIPINE BESYLATE 10 MG PO TABS
10.0000 mg | ORAL_TABLET | Freq: Every day | ORAL | Status: DC
  Administered 2022-01-28: 10 mg via ORAL
  Filled 2022-01-27: qty 1

## 2022-01-27 MED ORDER — SODIUM CHLORIDE 0.9% FLUSH: 3.0000 mL | Freq: Two times a day (BID) | INTRAVENOUS | Status: DC

## 2022-01-27 MED ORDER — HYDROCODONE-ACETAMINOPHEN 5-325 MG PO TABS: 1.0000 | ORAL_TABLET | ORAL | Status: DC | PRN

## 2022-01-27 MED ORDER — CEFAZOLIN SODIUM-DEXTROSE 2-4 GM/100ML-% IV SOLN
INTRAVENOUS | Status: AC
  Administered 2022-01-27: 2 g via INTRAVENOUS
  Filled 2022-01-27: qty 100

## 2022-01-27 MED ORDER — VERAPAMIL HCL 2.5 MG/ML IV SOLN
INTRAVENOUS | Status: AC
  Filled 2022-01-27: qty 2

## 2022-01-27 MED ORDER — KETOROLAC TROMETHAMINE 30 MG/ML IJ SOLN: 30.0000 mg | INTRAMUSCULAR | Status: AC

## 2022-01-27 MED ORDER — SODIUM CHLORIDE 0.9% FLUSH: 9.0000 mL | INTRAVENOUS | Status: DC | PRN

## 2022-01-27 MED ORDER — KETOROLAC TROMETHAMINE 30 MG/ML IJ SOLN
INTRAMUSCULAR | Status: AC
  Administered 2022-01-27: 30 mg via INTRAVENOUS
  Filled 2022-01-27: qty 1

## 2022-01-27 MED ORDER — SODIUM CHLORIDE 0.9 % IV SOLN
250.0000 mL | INTRAVENOUS | Status: DC | PRN
Start: 2022-01-27 — End: 2022-01-28

## 2022-01-27 MED ORDER — MIDAZOLAM HCL 2 MG/2ML IJ SOLN
INTRAMUSCULAR | Status: AC | PRN
  Administered 2022-01-27: .5 mg via INTRAVENOUS

## 2022-01-27 NOTE — Procedures (Signed)
  Procedure:  Transradial uterine fibroid embolization   Preprocedure diagnosis: The encounter diagnosis was Uterine leiomyoma, unspecified location.  Postprocedure diagnosis: same EBL:    minimal Complications:   none immediate  See full dictation in [facility].  Dillard Cannon MD Main # [phone] Pager  [phone] Mobile [phone]

## 2022-01-28 DIAGNOSIS — D259 Leiomyoma of uterus, unspecified: Secondary | ICD-10-CM | POA: Diagnosis not present

## 2022-01-28 MED ORDER — DOCUSATE SODIUM 100 MG PO CAPS: 100.0000 mg | ORAL_CAPSULE | Freq: Two times a day (BID) | ORAL | 0 refills | Status: DC

## 2022-01-28 MED ORDER — METOCLOPRAMIDE HCL 10 MG PO TABS: 10.0000 mg | ORAL_TABLET | Freq: Four times a day (QID) | ORAL | 0 refills | Status: DC | PRN

## 2022-01-28 MED ORDER — SCOPOLAMINE 1 MG/3DAYS TD PT72: 1.0000 | MEDICATED_PATCH | TRANSDERMAL | 12 refills | Status: DC

## 2022-01-28 MED ORDER — IBUPROFEN 600 MG PO TABS: 600.0000 mg | ORAL_TABLET | Freq: Four times a day (QID) | ORAL | 0 refills | Status: AC | PRN

## 2022-01-28 MED ORDER — HYDROCODONE-ACETAMINOPHEN 5-325 MG PO TABS: 1.0000 | ORAL_TABLET | ORAL | 0 refills | Status: DC | PRN

## 2022-01-28 NOTE — Progress Notes (Signed)
  Transition of Care (TOC) Screening Note   Patient Details  Name: Jamie Barajas Date of Birth: May 18, 1971   Transition of Care [facility]) CM/SW Contact:    Lennart Pall, LCSW Phone Number: 01/28/2022, 11:22 AM    Transition of Care Department [facility]) has reviewed patient and no TOC needs have been identified at this time. We will continue to monitor patient advancement through interdisciplinary progression rounds. If new patient transition needs arise, please place a TOC consult.

## 2022-01-28 NOTE — Discharge Summary (Signed)
Patient ID: Cigi Bega MRN: [id number] DOB/AGE: [birth year] 51 y.o.  Admit date: 01/27/2022 Discharge date: 01/28/2022  Supervising Physician: Oley Balm  Patient Status: [facility] - In-pt  Admission Diagnoses: Symptomatic uterine fibroids  Discharge Diagnoses: Symptomatic uterine fibroids, status post technically successful bilateral uterine artery embolization via left radial artery access on 01/27/2022 Principal Problem:   Fibroid uterus  Past Medical History:  Diagnosis Date   Abnormal EKG    Decreased anterior R wave progression, nonspecific ST-T wave changes   Anemia    Anxiety    Atrial tachycardia (HCC)    History of atrial tachycardia prior to 2013.   Bilateral swelling of feet    Chest pain    Nuclear, Dec 29, 2011, normal, EF 70%   Family history of breast cancer 02/09/2021   Fibroid    Fibroid uterus 02/09/2021   Fibroids    History of anemia 02/09/2021   Hypertension    Hyperthyroidism    2013, Patient seen extensively by endocrinology with treatment given. See hospital records   Menorrhagia with irregular cycle 02/09/2021   Prolonged QT interval    QT interval is normal when the T wave is clear   PTSD (post-traumatic stress disorder)    Shortness of breath    Echo, May, 2013, EF 65%, no valvular abnormalities.   Sickle cell trait (HCC)    SVT (supraventricular tachycardia) (HCC)    Uveitis    Vitamin B 12 deficiency    Vitamin D deficiency    Past Surgical History:  Procedure Laterality Date   cryosurgery of cervix     IR ANGIOGRAM PELVIS SELECTIVE OR SUPRASELECTIVE  01/27/2022   IR ANGIOGRAM SELECTIVE EACH ADDITIONAL VESSEL  01/27/2022   IR ANGIOGRAM SELECTIVE EACH ADDITIONAL VESSEL  01/27/2022   IR EMBO TUMOR ORGAN ISCHEMIA INFARCT INC GUIDE ROADMAPPING  01/27/2022   IR RADIOLOGIST EVAL & MGMT  03/24/2017   IR RADIOLOGIST EVAL & MGMT  03/06/2021   IR US GUIDE VASC ACCESS LEFT  01/27/2022   KNEE ARTHROSCOPY W/ INTERNAL FIXATION TIBIAL SPINE  FRACTURE Right ~ 2011     Discharged Condition: good  Hospital Course: Jamie Barajas is a 51 y.o. female w/ PMH of anemia, atrial tachycardia, chest pain, uterine fibroids, HTN, menorrhagia with irregular cycle, prolonged QT, PTSD and SVT. Pt was referred by Jamie Exon, MD to IR for consult to discuss treatment options for symptomatic uterine fibroids. Pt met with Jamie Barajas 03/06/21 and decided to proceed with uterine fibroid embolization with moderate sedation .  On 01/27/2022 she underwent technically successful bilateral uterine artery embolization via left radial artery access by Jamie Barajas.  The procedure was performed without immediate complications and she was admitted for overnight observation for pain control.  She was placed on Dilaudid PCA pump.  Overnight the patient did well with only minimal pelvic cramping.  On the day of discharge she was stable.  Only complaint was some minor pelvic cramping.  She denied fever, headache, chest pain, dyspnea, cough, back pain, nausea, vomiting.  Access site left radial artery clean, dry, nontender, no hematoma. She was able to tolerate her diet, ambulate and void without difficulty.  She was deemed stable for discharge at this time.  We will follow-up with the patient either via phone call or in IR clinic in 3 to 4 weeks. She  will continue current home medications.  Electronic prescriptions were sent into patient's pharmacy for Vicodin, Colace, scopolamine patch and Reglan.  She was told to contact our  service with any additional questions or concerns.  She will follow-up with Dr. Talbert Barajas her gynecologist as scheduled  Consults: None  Significant Diagnostic Studies:  Results for orders placed or performed during the hospital encounter of 01/27/22  CBC with Differential/Platelet  Result Value Ref Range   WBC 4.6 4.0 - 10.5 K/uL   RBC 6.08 (H) 3.87 - 5.11 MIL/uL   Hemoglobin 14.4 12.0 - 15.0 g/dL   HCT 44.5 36.0 - 46.0 %   MCV 73.2 (L) 80.0  - 100.0 fL   MCH 23.7 (L) 26.0 - 34.0 pg   MCHC 32.4 30.0 - 36.0 g/dL   RDW 15.9 (H) 11.5 - 15.5 %   Platelets 213 150 - 400 K/uL   nRBC 0.0 0.0 - 0.2 %   Neutrophils Relative % 66 %   Neutro Abs 3.0 1.7 - 7.7 K/uL   Lymphocytes Relative 26 %   Lymphs Abs 1.2 0.7 - 4.0 K/uL   Monocytes Relative 7 %   Monocytes Absolute 0.3 0.1 - 1.0 K/uL   Eosinophils Relative 1 %   Eosinophils Absolute 0.1 0.0 - 0.5 K/uL   Basophils Relative 0 %   Basophils Absolute 0.0 0.0 - 0.1 K/uL   Immature Granulocytes 0 %   Abs Immature Granulocytes 0.02 0.00 - 0.07 K/uL  Protime-INR  Result Value Ref Range   Prothrombin Time 12.9 11.4 - 15.2 seconds   INR 1.0 0.8 - 1.2  Basic metabolic panel  Result Value Ref Range   Sodium 141 135 - 145 mmol/L   Potassium 3.7 3.5 - 5.1 mmol/L   Chloride 104 98 - 111 mmol/L   CO2 29 22 - 32 mmol/L   Glucose, Bld 92 70 - 99 mg/dL   BUN 19 6 - 20 mg/dL   Creatinine, Ser 0.80 0.44 - 1.00 mg/dL   Calcium 9.4 8.9 - 10.3 mg/dL   GFR, Estimated >60 >60 mL/min   Anion gap 8 5 - 15  hCG, serum, qualitative  Result Value Ref Range   Preg, Serum NEGATIVE NEGATIVE     Treatments: Successful bilateral uterine artery embolization on 01/27/2022 via  IV conscious sedation  Discharge Exam: Blood pressure 107/77, pulse 68, temperature 98.1 F (36.7 C), temperature source Oral, resp. rate 14, height $RemoveBe'5\' 4"'oQirqLxRd$  (1.626 m), weight 172 lb 13.5 oz (78.4 kg), last menstrual period 12/26/2021, SpO2 96 %. Awake, alert.  Chest clear to auscultation bilaterally.  Heart with regular rate and rhythm.  Abdomen soft, positive bowel sounds, nontender.  No lower extremity edema.  Puncture site left radial artery soft, clean, dry, nontender, no hematoma.  Disposition: Discharge disposition: 01-Home or Self Care       Discharge Instructions     Call MD for:  difficulty breathing, headache or visual disturbances   Complete by: As directed    Call MD for:  extreme fatigue   Complete by: As  directed    Call MD for:  hives   Complete by: As directed    Call MD for:  persistant dizziness or light-headedness   Complete by: As directed    Call MD for:  persistant nausea and vomiting   Complete by: As directed    Call MD for:  redness, tenderness, or signs of infection (pain, swelling, redness, odor or green/yellow discharge around incision site)   Complete by: As directed    Call MD for:  severe uncontrolled pain   Complete by: As directed    Call MD for:  temperature >100.4  Complete by: As directed    Change dressing (specify)   Complete by: As directed    May change bandage over left wrist and apply Band-Aid to site for the next 2 to 3 days.  May wash site with soap and water.   Diet - low sodium heart healthy   Complete by: As directed    Discharge instructions   Complete by: As directed    May resume home medications, stay well-hydrated, do not drive after taking narcotic agents.   Driving Restrictions   Complete by: As directed    No driving for the next 24 hours or after taking narcotic medication   Increase activity slowly   Complete by: As directed    Lifting restrictions   Complete by: As directed    No heavy lifting for the next 3 to 4 days   Sexual Activity Restrictions   Complete by: As directed    No sexual intercourse for 1 week      Allergies as of 01/28/2022       Reactions   Azithromycin Itching        Medication List     TAKE these medications    amLODipine 10 MG tablet Commonly known as: NORVASC Take 10 mg by mouth daily.   docusate sodium 100 MG capsule Commonly known as: COLACE Take 1 capsule (100 mg total) by mouth 2 (two) times daily.   folic acid 1 MG tablet Commonly known as: FOLVITE Take 1 tablet (1 mg total) by mouth daily.   HYDROcodone-acetaminophen 5-325 MG tablet Commonly known as: NORCO/VICODIN Take 1-2 tablets by mouth every 4 (four) hours as needed for moderate pain.   ibuprofen 600 MG tablet Commonly known  as: ADVIL Take 1 tablet (600 mg total) by mouth every 6 (six) hours as needed for up to 5 days for moderate pain.   IRON PO Take 1 tablet by mouth daily.   LORazepam 0.5 MG tablet Commonly known as: ATIVAN Take 0.5 mg by mouth daily as needed. For anxiety.   methimazole 5 MG tablet Commonly known as: TAPAZOLE Take 5 mg by mouth daily.   metoCLOPramide 10 MG tablet Commonly known as: Reglan Take 1 tablet (10 mg total) by mouth every 6 (six) hours as needed for nausea.   metoprolol succinate 25 MG 24 hr tablet Commonly known as: TOPROL-XL Take 25 mg by mouth daily.   scopolamine 1 MG/3DAYS Commonly known as: TRANSDERM-SCOP Place 1 patch (1.5 mg total) onto the skin every 3 (three) days. Start taking on: January 30, 2022   Vitamin D (Ergocalciferol) 1.25 MG (50000 UNIT) Caps capsule Commonly known as: DRISDOL Take 1 capsule (50,000 Units total) by mouth every 7 (seven) days.               Discharge Care Instructions  (From admission, onward)           Start     Ordered   01/28/22 0000  Change dressing (specify)       Comments: May change bandage over left wrist and apply Band-Aid to site for the next 2 to 3 days.  May wash site with soap and water.   01/28/22 1428            Follow-up Information     Arne Cleveland, MD Follow up.   Specialties: Interventional Radiology, Radiology Why: Dr. Vernard Gambles will follow up with you either via phone call or at [place] clinic in 3 to 4 weeks; call [phone] or [phone]  with any questions Contact information: [address] [place] [address] [phone]         Salvadore Dom, MD Follow up.   Specialty: Obstetrics and Gynecology Why: Follow-up with Dr. Talbert Barajas as scheduled Contact information: [place] [address] [phone]                  Electronically Signed: D. Rowe Robert, PA-C 01/28/2022, 2:32 PM   I have spent Less Than 30 Minutes  discharging Jamie Barajas.

## 2022-02-02 ENCOUNTER — Telehealth: Payer: Self-pay | Admitting: Student

## 2022-02-02 NOTE — Telephone Encounter (Signed)
Patient with a history of uterine fibroids s/p UFE with Dr. Vernard Gambles 01/27/22.   Patient called the outpatient clinic today stating she had been running a low grade fever that started last Thursday that is responsive to ibuprofen but then returns. Patient also stated she has had vaginal discharge with some odor.   IR will plan to see the patient at the clinic tomorrow or as soon as possible for further evaluation. A scheduler from our clinic will call her with a date/time.  Jamie Barajas, [place] [phone] 02/02/2022, 2:56 PM

## 2022-02-03 ENCOUNTER — Other Ambulatory Visit: Payer: Self-pay | Admitting: Interventional Radiology

## 2022-02-03 ENCOUNTER — Encounter: Payer: Self-pay | Admitting: *Deleted

## 2022-02-03 ENCOUNTER — Ambulatory Visit
Admission: RE | Admit: 2022-02-03 | Discharge: 2022-02-03 | Disposition: A | Discharge status: Home / Self Care | Payer: BC Managed Care – PPO | Source: Ambulatory Visit | Attending: Radiology | Admitting: Radiology

## 2022-02-03 ENCOUNTER — Other Ambulatory Visit (HOSPITAL_COMMUNITY): Payer: Self-pay | Admitting: Radiology

## 2022-02-03 DIAGNOSIS — D259 Leiomyoma of uterus, unspecified: Secondary | ICD-10-CM

## 2022-02-03 HISTORY — PX: IR RADIOLOGIST EVAL & MGMT: IMG5224

## 2022-02-03 MED ORDER — AMOXICILLIN-POT CLAVULANATE 875-125 MG PO TABS: 1.0000 | ORAL_TABLET | Freq: Two times a day (BID) | ORAL | 0 refills | Status: AC

## 2022-02-03 MED ORDER — DOXYCYCLINE HYCLATE 100 MG PO CAPS: 100.0000 mg | ORAL_CAPSULE | Freq: Two times a day (BID) | ORAL | 0 refills | Status: AC

## 2022-02-03 NOTE — Progress Notes (Addendum)
Chief Complaint: Patient was seen in consultation today for fever and vaginal discharge after uterine fibroid embolization.  History of Present Illness: Jamie Barajas is a 51 y.o. female one week status post uterine fibroid embolization by Dr. Deanne Coffer on 01/27/2022.  She was admitted for overnight observation and discharged on 01/28/2022. She noticed a fever of approximately 100.4 F orally on Thursday, the day after discharge.  She continued to have a low-grade fever between 100.2 degrees and 100.7 degrees until yesterday around noon.  Over the course of those several days, fever did improve after taking ibuprofen as instructed after the procedure.  She took ibuprofen for approximately 6 days.  She has been afebrile today.    She has had some associated thin, whitish vaginal discharge which she describes as slightly foul-smelling but small in volume.  She denies any vaginal bleeding or expulsion of tissue.  Vaginal discharge has not been worsening since the procedure.  She has had some nausea but no vomiting and appetite is now good.  Her activity level has been gradually increasing since the procedure.  Past Medical History:  Diagnosis Date   Abnormal EKG    Decreased anterior R wave progression, nonspecific ST-T wave changes   Anemia    Anxiety    Atrial tachycardia (HCC)    History of atrial tachycardia prior to 2013.   Bilateral swelling of feet    Chest pain    Nuclear, Dec 29, 2011, normal, EF 70%   Family history of breast cancer 02/09/2021   Fibroid    Fibroid uterus 02/09/2021   Fibroids    History of anemia 02/09/2021   Hypertension    Hyperthyroidism    2013, Patient seen extensively by endocrinology with treatment given. See hospital records   Menorrhagia with irregular cycle 02/09/2021   Prolonged QT interval    QT interval is normal when the T wave is clear   PTSD (post-traumatic stress disorder)    Shortness of breath    Echo, May, 2013, EF 65%, no valvular  abnormalities.   Sickle cell trait (HCC)    SVT (supraventricular tachycardia) (HCC)    Uveitis    Vitamin B 12 deficiency    Vitamin D deficiency     Past Surgical History:  Procedure Laterality Date   cryosurgery of cervix     IR ANGIOGRAM PELVIS SELECTIVE OR SUPRASELECTIVE  01/27/2022   IR ANGIOGRAM SELECTIVE EACH ADDITIONAL VESSEL  01/27/2022   IR ANGIOGRAM SELECTIVE EACH ADDITIONAL VESSEL  01/27/2022   IR EMBO TUMOR ORGAN ISCHEMIA INFARCT INC GUIDE ROADMAPPING  01/27/2022   IR RADIOLOGIST EVAL & MGMT  03/24/2017   IR RADIOLOGIST EVAL & MGMT  03/06/2021   IR US GUIDE VASC ACCESS LEFT  01/27/2022   KNEE ARTHROSCOPY W/ INTERNAL FIXATION TIBIAL SPINE FRACTURE Right ~ 2011    Allergies: Azithromycin  Medications: Prior to Admission medications   Medication Sig Start Date End Date Taking? Authorizing Provider  amLODipine (NORVASC) 10 MG tablet Take 10 mg by mouth daily.    [provider]  docusate sodium (COLACE) 100 MG capsule Take 1 capsule (100 mg total) by mouth 2 (two) times daily. 01/28/22   Allred, Darrell K, PA-C  Ferrous Sulfate (IRON PO) Take 1 tablet by mouth daily.    [provider]  folic acid (FOLVITE) 1 MG tablet Take 1 tablet (1 mg total) by mouth daily. 03/10/21   Erenest Blank, NP  HYDROcodone-acetaminophen (NORCO/VICODIN) 5-325 MG tablet Take 1-2 tablets by  mouth every 4 (four) hours as needed for moderate pain. 01/28/22   Allred, Darrell K, PA-C  LORazepam (ATIVAN) 0.5 MG tablet Take 0.5 mg by mouth daily as needed. For anxiety.    [provider]  methimazole (TAPAZOLE) 5 MG tablet Take 5 mg by mouth daily.    [provider]  metoCLOPramide (REGLAN) 10 MG tablet Take 1 tablet (10 mg total) by mouth every 6 (six) hours as needed for nausea. 01/28/22   Allred, Darrell K, PA-C  metoprolol succinate (TOPROL-XL) 25 MG 24 hr tablet Take 25 mg by mouth daily.    [provider]  scopolamine (TRANSDERM-SCOP) 1 MG/3DAYS Place 1  patch (1.5 mg total) onto the skin every 3 (three) days. 01/30/22   Allred, Darrell K, PA-C  Vitamin D, Ergocalciferol, (DRISDOL) 1.25 MG (50000 UNIT) CAPS capsule Take 1 capsule (50,000 Units total) by mouth every 7 (seven) days. 11/19/21   Briscoe Deutscher, DO     Family History  Problem Relation Age of Onset   Breast cancer Mother 48   Stroke Mother    Cancer Mother    Hypertension Mother    Sarcoidosis Mother    Stroke Father    Hypertension Father    Hyperlipidemia Father    Breast cancer Sister 28    Social History   Socioeconomic History   Marital status: Married    Spouse name: Not on file   Number of children: 2   Years of education: Not on file   Highest education level: Not on file  Occupational History   Occupation: TEACHER    Employer: [employer]  Tobacco Use   Smoking status: Never   Smokeless tobacco: Never  Vaping Use   Vaping Use: Never used  Substance and Sexual Activity   Alcohol use: No    Comment: 12/15/11 "glass of wine maybe 3 times/month"   Drug use: No   Sexual activity: Yes    Birth control/protection: Condom  Other Topics Concern   Not on file  Social History Narrative   Lives with husband in [place], [place].             Social Determinants of Health   Financial Resource Strain: Not on file  Food Insecurity: Not on file  Transportation Needs: Not on file  Physical Activity: Not on file  Stress: Not on file  Social Connections: Not on file    Review of Systems: A 12 point ROS discussed and pertinent positives are indicated in the HPI above.  All other systems are negative.  Review of Systems  Constitutional:  Positive for fever.  Respiratory: Negative.    Cardiovascular: Negative.   Gastrointestinal: Negative.   Genitourinary:  Positive for vaginal discharge. Negative for hematuria, pelvic pain and vaginal bleeding.  Musculoskeletal: Negative.   Neurological: Negative.     Vital Signs: LMP 12/26/2021    Physical  Exam Vitals reviewed.  Constitutional:      General: She is not in acute distress.    Appearance: Normal appearance. She is not ill-appearing, toxic-appearing or diaphoretic.  Abdominal:     General: There is no distension.     Palpations: Abdomen is soft.     Tenderness: There is no abdominal tenderness. There is no guarding or rebound.  Musculoskeletal:        General: No swelling.     Comments: Left radial artery access site clean and without hematoma or tenderness. Normal palpable left radial pulse.  Skin:    General:  Skin is warm and dry.  Neurological:     General: No focal deficit present.     Mental Status: She is alert and oriented to person, place, and time.     Imaging: IR EMBO TUMOR ORGAN ISCHEMIA INFARCT INC GUIDE ROADMAPPING  Result Date: 01/27/2022 CLINICAL DATA:  Symptomatic uterine fibroids. See previous consultation. EXAM: EXAM BILATERAL UTERINE ARTERY EMBOLIZATION TECHNIQUE: The procedure, risks, benefits, and alternatives were explained to the patient. Questions regarding the procedure were encouraged and answered. The patient understands and consents to the procedure. As antibiotic prophylaxis, cefazolin 2 gwas ordered pre-procedure and administered intravenously within one hour of incision. Ultrasound survey of the left wrist was performed with images stored and sent to PACS. Diameter radial artery measured up to 3 mm. 1% lidocaine was used for local anesthesia. A micropuncture needle was used access the left radial artery under ultrasound. With excellent arterial blood flow returned, and an .018 micro wire was passed through the needle into the radial artery. The needle was removed, and a 66F Glidesheath Slender was placed over the wire. The inner dilator and wire were removed, and the sheath was flushed. Radial cocktail was then infused slowly, after diluting the volume with ~15cc of blood. A benson wire was then used to navigate an angled 66F catheter into the descending  aorta. The catheter was used to selectively catheterize the right internal iliac artery for pelvic arteriography. A coaxial catheter was advanced with the shapeable 016 wire and used to selectively catheterize the right uterine artery. The microcatheter tip was positioned in the distal horizontal segment. Selective arteriogram confirms appropriate positioning. Distal branches of the right uterine artery were embolized with 500-700 micron Embospheres. Embolization continued until near stasis of flow was achieved. Microcatheter was withdrawn and a followup selective right internal iliac arteriogram was obtained. The guide catheter was partially withdrawn, then advanced to the contralateral side and the left internal iliac artery was selectively catheterized. Again the microcatheter with 016 guidewire was coaxially advanced and used to selectively catheterize the left uterine artery. Confirmatory arteriogram was performed. Left uterine artery branches were embolized with 500-700 micron Embospheres to near stasis of flow. A total of 7 vials of Embospheres were utilized for the case. Microcatheter was withdrawn and a followup arteriogram of the right internal iliac artery was performed. The angiographic catheter removed. Additional nitroglycerin and verapamil infused slowly through the radial sheath. The sheath was removed and hemostasis achieved with the TR band. Patient tolerated the procedure well. FLUOROSCOPY TIME:  Radiation Exposure Index (as provided by the fluoroscopic device): 414 mGy air Kerma COMPLICATIONS: COMPLICATIONS None immediate IMPRESSION: 1. Technically successful bilateral uterine artery embolization using 500-700 micron Embospheres. Electronically Signed   By: Lucrezia Europe M.D.   On: 01/27/2022 15:19   IR US Guide Vasc Access Left  Result Date: 01/27/2022 CLINICAL DATA:  Symptomatic uterine fibroids. See previous consultation. EXAM: EXAM BILATERAL UTERINE ARTERY EMBOLIZATION TECHNIQUE: The  procedure, risks, benefits, and alternatives were explained to the patient. Questions regarding the procedure were encouraged and answered. The patient understands and consents to the procedure. As antibiotic prophylaxis, cefazolin 2 gwas ordered pre-procedure and administered intravenously within one hour of incision. Ultrasound survey of the left wrist was performed with images stored and sent to PACS. Diameter radial artery measured up to 3 mm. 1% lidocaine was used for local anesthesia. A micropuncture needle was used access the left radial artery under ultrasound. With excellent arterial blood flow returned, and an .018 micro wire was passed  through the needle into the radial artery. The needle was removed, and a 658F Glidesheath Slender was placed over the wire. The inner dilator and wire were removed, and the sheath was flushed. Radial cocktail was then infused slowly, after diluting the volume with ~15cc of blood. A benson wire was then used to navigate an angled 658F catheter into the descending aorta. The catheter was used to selectively catheterize the right internal iliac artery for pelvic arteriography. A coaxial catheter was advanced with the shapeable 016 wire and used to selectively catheterize the right uterine artery. The microcatheter tip was positioned in the distal horizontal segment. Selective arteriogram confirms appropriate positioning. Distal branches of the right uterine artery were embolized with 500-700 micron Embospheres. Embolization continued until near stasis of flow was achieved. Microcatheter was withdrawn and a followup selective right internal iliac arteriogram was obtained. The guide catheter was partially withdrawn, then advanced to the contralateral side and the left internal iliac artery was selectively catheterized. Again the microcatheter with 016 guidewire was coaxially advanced and used to selectively catheterize the left uterine artery. Confirmatory arteriogram was performed.  Left uterine artery branches were embolized with 500-700 micron Embospheres to near stasis of flow. A total of 7 vials of Embospheres were utilized for the case. Microcatheter was withdrawn and a followup arteriogram of the right internal iliac artery was performed. The angiographic catheter removed. Additional nitroglycerin and verapamil infused slowly through the radial sheath. The sheath was removed and hemostasis achieved with the TR band. Patient tolerated the procedure well. FLUOROSCOPY TIME:  Radiation Exposure Index (as provided by the fluoroscopic device): 414 mGy air Kerma COMPLICATIONS: COMPLICATIONS None immediate IMPRESSION: 1. Technically successful bilateral uterine artery embolization using 500-700 micron Embospheres. Electronically Signed   By: Lucrezia Europe M.D.   On: 01/27/2022 15:19   IR Angiogram Pelvis Selective Or Supraselective  Result Date: 01/27/2022 CLINICAL DATA:  Symptomatic uterine fibroids. See previous consultation. EXAM: EXAM BILATERAL UTERINE ARTERY EMBOLIZATION TECHNIQUE: The procedure, risks, benefits, and alternatives were explained to the patient. Questions regarding the procedure were encouraged and answered. The patient understands and consents to the procedure. As antibiotic prophylaxis, cefazolin 2 gwas ordered pre-procedure and administered intravenously within one hour of incision. Ultrasound survey of the left wrist was performed with images stored and sent to PACS. Diameter radial artery measured up to 3 mm. 1% lidocaine was used for local anesthesia. A micropuncture needle was used access the left radial artery under ultrasound. With excellent arterial blood flow returned, and an .018 micro wire was passed through the needle into the radial artery. The needle was removed, and a 658F Glidesheath Slender was placed over the wire. The inner dilator and wire were removed, and the sheath was flushed. Radial cocktail was then infused slowly, after diluting the volume with  ~15cc of blood. A benson wire was then used to navigate an angled 658F catheter into the descending aorta. The catheter was used to selectively catheterize the right internal iliac artery for pelvic arteriography. A coaxial catheter was advanced with the shapeable 016 wire and used to selectively catheterize the right uterine artery. The microcatheter tip was positioned in the distal horizontal segment. Selective arteriogram confirms appropriate positioning. Distal branches of the right uterine artery were embolized with 500-700 micron Embospheres. Embolization continued until near stasis of flow was achieved. Microcatheter was withdrawn and a followup selective right internal iliac arteriogram was obtained. The guide catheter was partially withdrawn, then advanced to the contralateral side and the left internal iliac  artery was selectively catheterized. Again the microcatheter with 016 guidewire was coaxially advanced and used to selectively catheterize the left uterine artery. Confirmatory arteriogram was performed. Left uterine artery branches were embolized with 500-700 micron Embospheres to near stasis of flow. A total of 7 vials of Embospheres were utilized for the case. Microcatheter was withdrawn and a followup arteriogram of the right internal iliac artery was performed. The angiographic catheter removed. Additional nitroglycerin and verapamil infused slowly through the radial sheath. The sheath was removed and hemostasis achieved with the TR band. Patient tolerated the procedure well. FLUOROSCOPY TIME:  Radiation Exposure Index (as provided by the fluoroscopic device): 414 mGy air Kerma COMPLICATIONS: COMPLICATIONS None immediate IMPRESSION: 1. Technically successful bilateral uterine artery embolization using 500-700 micron Embospheres. Electronically Signed   By: Lucrezia Europe M.D.   On: 01/27/2022 15:19   IR Angiogram Selective Each Additional Vessel  Result Date: 01/27/2022 CLINICAL DATA:  Symptomatic  uterine fibroids. See previous consultation. EXAM: EXAM BILATERAL UTERINE ARTERY EMBOLIZATION TECHNIQUE: The procedure, risks, benefits, and alternatives were explained to the patient. Questions regarding the procedure were encouraged and answered. The patient understands and consents to the procedure. As antibiotic prophylaxis, cefazolin 2 gwas ordered pre-procedure and administered intravenously within one hour of incision. Ultrasound survey of the left wrist was performed with images stored and sent to PACS. Diameter radial artery measured up to 3 mm. 1% lidocaine was used for local anesthesia. A micropuncture needle was used access the left radial artery under ultrasound. With excellent arterial blood flow returned, and an .018 micro wire was passed through the needle into the radial artery. The needle was removed, and a 68F Glidesheath Slender was placed over the wire. The inner dilator and wire were removed, and the sheath was flushed. Radial cocktail was then infused slowly, after diluting the volume with ~15cc of blood. A benson wire was then used to navigate an angled 68F catheter into the descending aorta. The catheter was used to selectively catheterize the right internal iliac artery for pelvic arteriography. A coaxial catheter was advanced with the shapeable 016 wire and used to selectively catheterize the right uterine artery. The microcatheter tip was positioned in the distal horizontal segment. Selective arteriogram confirms appropriate positioning. Distal branches of the right uterine artery were embolized with 500-700 micron Embospheres. Embolization continued until near stasis of flow was achieved. Microcatheter was withdrawn and a followup selective right internal iliac arteriogram was obtained. The guide catheter was partially withdrawn, then advanced to the contralateral side and the left internal iliac artery was selectively catheterized. Again the microcatheter with 016 guidewire was coaxially  advanced and used to selectively catheterize the left uterine artery. Confirmatory arteriogram was performed. Left uterine artery branches were embolized with 500-700 micron Embospheres to near stasis of flow. A total of 7 vials of Embospheres were utilized for the case. Microcatheter was withdrawn and a followup arteriogram of the right internal iliac artery was performed. The angiographic catheter removed. Additional nitroglycerin and verapamil infused slowly through the radial sheath. The sheath was removed and hemostasis achieved with the TR band. Patient tolerated the procedure well. FLUOROSCOPY TIME:  Radiation Exposure Index (as provided by the fluoroscopic device): 414 mGy air Kerma COMPLICATIONS: COMPLICATIONS None immediate IMPRESSION: 1. Technically successful bilateral uterine artery embolization using 500-700 micron Embospheres. Electronically Signed   By: Lucrezia Europe M.D.   On: 01/27/2022 15:19   IR Angiogram Selective Each Additional Vessel  Result Date: 01/27/2022 CLINICAL DATA:  Symptomatic uterine fibroids. See previous  consultation. EXAM: EXAM BILATERAL UTERINE ARTERY EMBOLIZATION TECHNIQUE: The procedure, risks, benefits, and alternatives were explained to the patient. Questions regarding the procedure were encouraged and answered. The patient understands and consents to the procedure. As antibiotic prophylaxis, cefazolin 2 gwas ordered pre-procedure and administered intravenously within one hour of incision. Ultrasound survey of the left wrist was performed with images stored and sent to PACS. Diameter radial artery measured up to 3 mm. 1% lidocaine was used for local anesthesia. A micropuncture needle was used access the left radial artery under ultrasound. With excellent arterial blood flow returned, and an .018 micro wire was passed through the needle into the radial artery. The needle was removed, and a 272F Glidesheath Slender was placed over the wire. The inner dilator and wire were  removed, and the sheath was flushed. Radial cocktail was then infused slowly, after diluting the volume with ~15cc of blood. A benson wire was then used to navigate an angled 272F catheter into the descending aorta. The catheter was used to selectively catheterize the right internal iliac artery for pelvic arteriography. A coaxial catheter was advanced with the shapeable 016 wire and used to selectively catheterize the right uterine artery. The microcatheter tip was positioned in the distal horizontal segment. Selective arteriogram confirms appropriate positioning. Distal branches of the right uterine artery were embolized with 500-700 micron Embospheres. Embolization continued until near stasis of flow was achieved. Microcatheter was withdrawn and a followup selective right internal iliac arteriogram was obtained. The guide catheter was partially withdrawn, then advanced to the contralateral side and the left internal iliac artery was selectively catheterized. Again the microcatheter with 016 guidewire was coaxially advanced and used to selectively catheterize the left uterine artery. Confirmatory arteriogram was performed. Left uterine artery branches were embolized with 500-700 micron Embospheres to near stasis of flow. A total of 7 vials of Embospheres were utilized for the case. Microcatheter was withdrawn and a followup arteriogram of the right internal iliac artery was performed. The angiographic catheter removed. Additional nitroglycerin and verapamil infused slowly through the radial sheath. The sheath was removed and hemostasis achieved with the TR band. Patient tolerated the procedure well. FLUOROSCOPY TIME:  Radiation Exposure Index (as provided by the fluoroscopic device): 414 mGy air Kerma COMPLICATIONS: COMPLICATIONS None immediate IMPRESSION: 1. Technically successful bilateral uterine artery embolization using 500-700 micron Embospheres. Electronically Signed   By: Lucrezia Europe M.D.   On: 01/27/2022  15:19    Labs:  CBC: Recent Labs    07/14/21 1355 10/17/21 1420 01/16/22 1354 01/27/22 1033  WBC 4.8 5.9 4.7 4.6  HGB 14.1 13.5 13.6 14.4  HCT 42.4 40.0 41.6 44.5  PLT 214 262 236 213    COAGS: Recent Labs    01/27/22 1033  INR 1.0    BMP: Recent Labs    02/19/21 1119 03/10/21 0854 03/25/21 1113 01/27/22 1033  NA  --  137 139 141  K  --  3.7 4.2 3.7  CL  --  103 104 104  CO2  --  $R'26 21 29  'gx$ GLUCOSE  --  118* 78 92  BUN $Re'15 15 12 19  'TmT$ CALCIUM  --  9.7 9.2 9.4  CREATININE 0.71 0.70 0.58 0.80  GFRNONAA 99 >60  --  >60  GFRAA 114  --   --   --     LIVER FUNCTION TESTS: Recent Labs    03/10/21 0854 03/25/21 1113  BILITOT 0.7 0.6  AST 25 18  ALT 24 15  ALKPHOS 56  58  PROT 7.0 6.6  ALBUMIN 4.1 4.0    Assessment and Plan:  I met with Jamie Barajas and her husband.  Based on symptoms after uterine fibroid embolization, she may have a low-grade endometritis which is now potentially improving as she has been afebrile today.  However, she continues to have some vaginal discharge and a course of oral antibiotics would be appropriate to prevent progression to significant endometritis which would potentially require hospital admission and IV antibiotic therapy.  After consultation with the hospital pharmacy, a course of oral Augmentin and doxycycline will be called into the patient's pharmacy.  We will follow-up with the patient in 2 weeks.   Electronically Signed: Azzie Roup 02/03/2022, 12:03 PM    I spent a total of 15 Minutes in face to face in clinical consultation, greater than 50% of which was counseling/coordinating care post uterine fibroid embolization.

## 2022-02-12 ENCOUNTER — Other Ambulatory Visit (HOSPITAL_COMMUNITY): Payer: BC Managed Care – PPO

## 2022-02-20 ENCOUNTER — Ambulatory Visit
Admission: RE | Admit: 2022-02-20 | Discharge: 2022-02-20 | Disposition: A | Discharge status: Home / Self Care | Payer: BC Managed Care – PPO | Source: Ambulatory Visit | Attending: Interventional Radiology | Admitting: Interventional Radiology

## 2022-02-20 DIAGNOSIS — D259 Leiomyoma of uterus, unspecified: Secondary | ICD-10-CM

## 2022-02-20 HISTORY — PX: IR RADIOLOGIST EVAL & MGMT: IMG5224

## 2022-02-20 NOTE — Progress Notes (Signed)
Patient ID: Jamie Barajas, female   DOB: 19-Dec-1970, 51 y.o.   MRN: [id number]       Chief Complaint: Patient was consulted remotely today (TeleHealth) for follow-up uterine fibroid embolization at the request of Shawnetta Lein.    Referring Physician(s): Romualdo Bolk   History of Present Illness: Jamie Barajas is a 51 y.o. female following up today as scheduled, post uterine fibroid embolization.  To review her history, 2016 initial uterine fibroid diagnosis.  G2 P2, no plans for future pregnancy. 2017 ultrasound confirmed uterine fibroid 03/24/2017 initial consultation for symptomatic uterine fibroids.  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 01/27/2022 technically successful transradial bilateral uterine fibroid embolization.  Patient observed overnight, discharged the following morning in good condition. 02/02/2022 patient called describing low-grade fever and some low-volume clear vaginal discharge.  This had been lasting about 5 days.  The objective fever improved on ibuprofen.  She was seen by my partner Dr. Fredia Sorrow following day, afebrile, prescribed p.o. course of Augmentin and doxycycline as prophylaxis against endometritis.  Patient states that she did very well after the course of antibiotics.  Her symptoms have resolved.  She only had minimal cramping after the embolization procedure.  She feels much better now.  She has good appetite.  Mild constipation.  She is back to full activity.  She thinks she might be having some perimenopausal symptoms.  She has not had a normal period yet  postembolization.  Past Medical History:  Diagnosis Date   Abnormal EKG    Decreased anterior R wave progression, nonspecific ST-T wave changes   Anemia    Anxiety    Atrial tachycardia (HCC)    History of atrial tachycardia prior to 2013.   Bilateral swelling of feet    Chest pain    Nuclear, Dec 29, 2011, normal, EF 70%   Family history of breast cancer 02/09/2021   Fibroid    Fibroid uterus 02/09/2021   Fibroids    History of anemia 02/09/2021   Hypertension    Hyperthyroidism    2013, Patient seen extensively by endocrinology with treatment given. See hospital records   Menorrhagia with irregular cycle 02/09/2021   Prolonged QT interval    QT interval is normal when the T wave is clear   PTSD (post-traumatic stress disorder)    Shortness of breath    Echo, May, 2013, EF 65%, no valvular abnormalities.   Sickle cell trait (HCC)    SVT (supraventricular tachycardia) (HCC)    Uveitis    Vitamin B 12 deficiency    Vitamin D deficiency     Past Surgical History:  Procedure Laterality Date   cryosurgery of cervix     IR ANGIOGRAM PELVIS SELECTIVE OR SUPRASELECTIVE  01/27/2022   IR ANGIOGRAM SELECTIVE EACH ADDITIONAL VESSEL  01/27/2022   IR ANGIOGRAM SELECTIVE EACH ADDITIONAL VESSEL  01/27/2022   IR EMBO TUMOR ORGAN ISCHEMIA INFARCT INC GUIDE ROADMAPPING  01/27/2022   IR RADIOLOGIST EVAL & MGMT  03/24/2017   IR RADIOLOGIST EVAL & MGMT  03/06/2021   IR RADIOLOGIST EVAL & MGMT  02/03/2022   IR US GUIDE VASC ACCESS LEFT  01/27/2022   KNEE ARTHROSCOPY W/ INTERNAL FIXATION TIBIAL SPINE FRACTURE Right ~ 2011    Allergies: Azithromycin  Medications: Prior to Admission medications   Medication Sig Start Date End Date Taking? Authorizing Provider  amLODipine (NORVASC) 10 MG tablet Take 10 mg by mouth daily.    [provider]  docusate sodium (COLACE) 100 MG capsule Take 1 capsule (100 mg total) by mouth 2 (two) times daily. 01/28/22   Allred, Darrell K, PA-C  Ferrous Sulfate  (IRON PO) Take 1 tablet by mouth daily.    [provider]  folic acid (FOLVITE) 1 MG tablet Take 1 tablet (1 mg total) by mouth daily. 03/10/21   Erenest Blank, NP  HYDROcodone-acetaminophen (NORCO/VICODIN) 5-325 MG tablet Take 1-2 tablets by mouth every 4 (four) hours as needed for moderate pain. 01/28/22   Allred, Darrell K, PA-C  LORazepam (ATIVAN) 0.5 MG tablet Take 0.5 mg by mouth daily as needed. For anxiety.    [provider]  methimazole (TAPAZOLE) 5 MG tablet Take 5 mg by mouth daily.    [provider]  metoCLOPramide (REGLAN) 10 MG tablet Take 1 tablet (10 mg total) by mouth every 6 (six) hours as needed for nausea. 01/28/22   Allred, Darrell K, PA-C  metoprolol succinate (TOPROL-XL) 25 MG 24 hr tablet Take 25 mg by mouth daily.    [provider]  scopolamine (TRANSDERM-SCOP) 1 MG/3DAYS Place 1 patch (1.5 mg total) onto the skin every 3 (three) days. 01/30/22   Allred, Darrell K, PA-C  Vitamin D, Ergocalciferol, (DRISDOL) 1.25 MG (50000 UNIT) CAPS capsule Take 1 capsule (50,000 Units total) by mouth every 7 (seven) days. 11/19/21   Helane Rima, DO     Family History  Problem Relation Age of Onset   Breast cancer Mother 60   Stroke Mother    Cancer Mother    Hypertension Mother    Sarcoidosis Mother    Stroke Father    Hypertension Father    Hyperlipidemia Father    Breast cancer Sister 64    Social History   Socioeconomic History   Marital status: Married    Spouse name: Not on file   Number of children: 2   Years of education: Not on file   Highest education level: Not on file  Occupational History   Occupation: TEACHER    Employer: [employer]  Tobacco Use   Smoking status: Never   Smokeless tobacco: Never  Vaping Use   Vaping Use: Never used  Substance and Sexual Activity   Alcohol use: No    Comment: 12/15/11 "glass of wine maybe 3 times/month"   Drug use: No   Sexual activity: Yes    Birth control/protection:  Condom  Other Topics Concern   Not on file  Social History Narrative   Lives with husband in [place], [place].             Social Determinants of Health   Financial Resource Strain: Not on file  Food Insecurity: Not on file  Transportation Needs: Not on file  Physical Activity: Not on file  Stress: Not on file  Social Connections: Not on file    ECOG Status: 1 - Symptomatic but completely ambulatory  Review of Systems  Review of Systems: A 12 point ROS discussed and pertinent positives are indicated in the HPI above.  All other systems are negative.    Physical Exam No direct physical exam was performed (except for noted visual exam findings with Video Visits).     Vital Signs: LMP 12/26/2021   Imaging: IR Radiologist Eval & Mgmt  Result  Date: 02/03/2022 Please refer to notes tab for details about interventional procedure. (Op Note)  IR EMBO TUMOR ORGAN ISCHEMIA INFARCT INC GUIDE ROADMAPPING  Result Date: 01/27/2022 CLINICAL DATA:  Symptomatic uterine fibroids. See previous consultation. EXAM: EXAM BILATERAL UTERINE ARTERY EMBOLIZATION TECHNIQUE: The procedure, risks, benefits, and alternatives were explained to the patient. Questions regarding the procedure were encouraged and answered. The patient understands and consents to the procedure. As antibiotic prophylaxis, cefazolin 2 gwas ordered pre-procedure and administered intravenously within one hour of incision. Ultrasound survey of the left wrist was performed with images stored and sent to PACS. Diameter radial artery measured up to 3 mm. 1% lidocaine was used for local anesthesia. A micropuncture needle was used access the left radial artery under ultrasound. With excellent arterial blood flow returned, and an .018 micro wire was passed through the needle into the radial artery. The needle was removed, and a 454F Glidesheath Slender was placed over the wire. The inner dilator and wire were removed, and the sheath was  flushed. Radial cocktail was then infused slowly, after diluting the volume with ~15cc of blood. A benson wire was then used to navigate an angled 454F catheter into the descending aorta. The catheter was used to selectively catheterize the right internal iliac artery for pelvic arteriography. A coaxial catheter was advanced with the shapeable 016 wire and used to selectively catheterize the right uterine artery. The microcatheter tip was positioned in the distal horizontal segment. Selective arteriogram confirms appropriate positioning. Distal branches of the right uterine artery were embolized with 500-700 micron Embospheres. Embolization continued until near stasis of flow was achieved. Microcatheter was withdrawn and a followup selective right internal iliac arteriogram was obtained. The guide catheter was partially withdrawn, then advanced to the contralateral side and the left internal iliac artery was selectively catheterized. Again the microcatheter with 016 guidewire was coaxially advanced and used to selectively catheterize the left uterine artery. Confirmatory arteriogram was performed. Left uterine artery branches were embolized with 500-700 micron Embospheres to near stasis of flow. A total of 7 vials of Embospheres were utilized for the case. Microcatheter was withdrawn and a followup arteriogram of the right internal iliac artery was performed. The angiographic catheter removed. Additional nitroglycerin and verapamil infused slowly through the radial sheath. The sheath was removed and hemostasis achieved with the TR band. Patient tolerated the procedure well. FLUOROSCOPY TIME:  Radiation Exposure Index (as provided by the fluoroscopic device): 414 mGy air Kerma COMPLICATIONS: COMPLICATIONS None immediate IMPRESSION: 1. Technically successful bilateral uterine artery embolization using 500-700 micron Embospheres. Electronically Signed   By: Corlis Leak M.D.   On: 01/27/2022 15:19   IR US Guide Vasc  Access Left  Result Date: 01/27/2022 CLINICAL DATA:  Symptomatic uterine fibroids. See previous consultation. EXAM: EXAM BILATERAL UTERINE ARTERY EMBOLIZATION TECHNIQUE: The procedure, risks, benefits, and alternatives were explained to the patient. Questions regarding the procedure were encouraged and answered. The patient understands and consents to the procedure. As antibiotic prophylaxis, cefazolin 2 gwas ordered pre-procedure and administered intravenously within one hour of incision. Ultrasound survey of the left wrist was performed with images stored and sent to PACS. Diameter radial artery measured up to 3 mm. 1% lidocaine was used for local anesthesia. A micropuncture needle was used access the left radial artery under ultrasound. With excellent arterial blood flow returned, and an .018 micro wire was passed through the needle into the radial artery. The needle was removed, and a 454F Glidesheath Slender was placed over the wire. The  inner dilator and wire were removed, and the sheath was flushed. Radial cocktail was then infused slowly, after diluting the volume with ~15cc of blood. A benson wire was then used to navigate an angled 58F catheter into the descending aorta. The catheter was used to selectively catheterize the right internal iliac artery for pelvic arteriography. A coaxial catheter was advanced with the shapeable 016 wire and used to selectively catheterize the right uterine artery. The microcatheter tip was positioned in the distal horizontal segment. Selective arteriogram confirms appropriate positioning. Distal branches of the right uterine artery were embolized with 500-700 micron Embospheres. Embolization continued until near stasis of flow was achieved. Microcatheter was withdrawn and a followup selective right internal iliac arteriogram was obtained. The guide catheter was partially withdrawn, then advanced to the contralateral side and the left internal iliac artery was selectively  catheterized. Again the microcatheter with 016 guidewire was coaxially advanced and used to selectively catheterize the left uterine artery. Confirmatory arteriogram was performed. Left uterine artery branches were embolized with 500-700 micron Embospheres to near stasis of flow. A total of 7 vials of Embospheres were utilized for the case. Microcatheter was withdrawn and a followup arteriogram of the right internal iliac artery was performed. The angiographic catheter removed. Additional nitroglycerin and verapamil infused slowly through the radial sheath. The sheath was removed and hemostasis achieved with the TR band. Patient tolerated the procedure well. FLUOROSCOPY TIME:  Radiation Exposure Index (as provided by the fluoroscopic device): 414 mGy air Kerma COMPLICATIONS: COMPLICATIONS None immediate IMPRESSION: 1. Technically successful bilateral uterine artery embolization using 500-700 micron Embospheres. Electronically Signed   By: Corlis Leak M.D.   On: 01/27/2022 15:19   IR Angiogram Pelvis Selective Or Supraselective  Result Date: 01/27/2022 CLINICAL DATA:  Symptomatic uterine fibroids. See previous consultation. EXAM: EXAM BILATERAL UTERINE ARTERY EMBOLIZATION TECHNIQUE: The procedure, risks, benefits, and alternatives were explained to the patient. Questions regarding the procedure were encouraged and answered. The patient understands and consents to the procedure. As antibiotic prophylaxis, cefazolin 2 gwas ordered pre-procedure and administered intravenously within one hour of incision. Ultrasound survey of the left wrist was performed with images stored and sent to PACS. Diameter radial artery measured up to 3 mm. 1% lidocaine was used for local anesthesia. A micropuncture needle was used access the left radial artery under ultrasound. With excellent arterial blood flow returned, and an .018 micro wire was passed through the needle into the radial artery. The needle was removed, and a 58F  Glidesheath Slender was placed over the wire. The inner dilator and wire were removed, and the sheath was flushed. Radial cocktail was then infused slowly, after diluting the volume with ~15cc of blood. A benson wire was then used to navigate an angled 58F catheter into the descending aorta. The catheter was used to selectively catheterize the right internal iliac artery for pelvic arteriography. A coaxial catheter was advanced with the shapeable 016 wire and used to selectively catheterize the right uterine artery. The microcatheter tip was positioned in the distal horizontal segment. Selective arteriogram confirms appropriate positioning. Distal branches of the right uterine artery were embolized with 500-700 micron Embospheres. Embolization continued until near stasis of flow was achieved. Microcatheter was withdrawn and a followup selective right internal iliac arteriogram was obtained. The guide catheter was partially withdrawn, then advanced to the contralateral side and the left internal iliac artery was selectively catheterized. Again the microcatheter with 016 guidewire was coaxially advanced and used to selectively catheterize the left uterine artery.  Confirmatory arteriogram was performed. Left uterine artery branches were embolized with 500-700 micron Embospheres to near stasis of flow. A total of 7 vials of Embospheres were utilized for the case. Microcatheter was withdrawn and a followup arteriogram of the right internal iliac artery was performed. The angiographic catheter removed. Additional nitroglycerin and verapamil infused slowly through the radial sheath. The sheath was removed and hemostasis achieved with the TR band. Patient tolerated the procedure well. FLUOROSCOPY TIME:  Radiation Exposure Index (as provided by the fluoroscopic device): 414 mGy air Kerma COMPLICATIONS: COMPLICATIONS None immediate IMPRESSION: 1. Technically successful bilateral uterine artery embolization using 500-700 micron  Embospheres. Electronically Signed   By: Corlis Leak M.D.   On: 01/27/2022 15:19   IR Angiogram Selective Each Additional Vessel  Result Date: 01/27/2022 CLINICAL DATA:  Symptomatic uterine fibroids. See previous consultation. EXAM: EXAM BILATERAL UTERINE ARTERY EMBOLIZATION TECHNIQUE: The procedure, risks, benefits, and alternatives were explained to the patient. Questions regarding the procedure were encouraged and answered. The patient understands and consents to the procedure. As antibiotic prophylaxis, cefazolin 2 gwas ordered pre-procedure and administered intravenously within one hour of incision. Ultrasound survey of the left wrist was performed with images stored and sent to PACS. Diameter radial artery measured up to 3 mm. 1% lidocaine was used for local anesthesia. A micropuncture needle was used access the left radial artery under ultrasound. With excellent arterial blood flow returned, and an .018 micro wire was passed through the needle into the radial artery. The needle was removed, and a 245F Glidesheath Slender was placed over the wire. The inner dilator and wire were removed, and the sheath was flushed. Radial cocktail was then infused slowly, after diluting the volume with ~15cc of blood. A benson wire was then used to navigate an angled 245F catheter into the descending aorta. The catheter was used to selectively catheterize the right internal iliac artery for pelvic arteriography. A coaxial catheter was advanced with the shapeable 016 wire and used to selectively catheterize the right uterine artery. The microcatheter tip was positioned in the distal horizontal segment. Selective arteriogram confirms appropriate positioning. Distal branches of the right uterine artery were embolized with 500-700 micron Embospheres. Embolization continued until near stasis of flow was achieved. Microcatheter was withdrawn and a followup selective right internal iliac arteriogram was obtained. The guide catheter  was partially withdrawn, then advanced to the contralateral side and the left internal iliac artery was selectively catheterized. Again the microcatheter with 016 guidewire was coaxially advanced and used to selectively catheterize the left uterine artery. Confirmatory arteriogram was performed. Left uterine artery branches were embolized with 500-700 micron Embospheres to near stasis of flow. A total of 7 vials of Embospheres were utilized for the case. Microcatheter was withdrawn and a followup arteriogram of the right internal iliac artery was performed. The angiographic catheter removed. Additional nitroglycerin and verapamil infused slowly through the radial sheath. The sheath was removed and hemostasis achieved with the TR band. Patient tolerated the procedure well. FLUOROSCOPY TIME:  Radiation Exposure Index (as provided by the fluoroscopic device): 414 mGy air Kerma COMPLICATIONS: COMPLICATIONS None immediate IMPRESSION: 1. Technically successful bilateral uterine artery embolization using 500-700 micron Embospheres. Electronically Signed   By: Corlis Leak M.D.   On: 01/27/2022 15:19   IR Angiogram Selective Each Additional Vessel  Result Date: 01/27/2022 CLINICAL DATA:  Symptomatic uterine fibroids. See previous consultation. EXAM: EXAM BILATERAL UTERINE ARTERY EMBOLIZATION TECHNIQUE: The procedure, risks, benefits, and alternatives were explained to the patient. Questions regarding the  procedure were encouraged and answered. The patient understands and consents to the procedure. As antibiotic prophylaxis, cefazolin 2 gwas ordered pre-procedure and administered intravenously within one hour of incision. Ultrasound survey of the left wrist was performed with images stored and sent to PACS. Diameter radial artery measured up to 3 mm. 1% lidocaine was used for local anesthesia. A micropuncture needle was used access the left radial artery under ultrasound. With excellent arterial blood flow returned, and an  .018 micro wire was passed through the needle into the radial artery. The needle was removed, and a 58F Glidesheath Slender was placed over the wire. The inner dilator and wire were removed, and the sheath was flushed. Radial cocktail was then infused slowly, after diluting the volume with ~15cc of blood. A benson wire was then used to navigate an angled 58F catheter into the descending aorta. The catheter was used to selectively catheterize the right internal iliac artery for pelvic arteriography. A coaxial catheter was advanced with the shapeable 016 wire and used to selectively catheterize the right uterine artery. The microcatheter tip was positioned in the distal horizontal segment. Selective arteriogram confirms appropriate positioning. Distal branches of the right uterine artery were embolized with 500-700 micron Embospheres. Embolization continued until near stasis of flow was achieved. Microcatheter was withdrawn and a followup selective right internal iliac arteriogram was obtained. The guide catheter was partially withdrawn, then advanced to the contralateral side and the left internal iliac artery was selectively catheterized. Again the microcatheter with 016 guidewire was coaxially advanced and used to selectively catheterize the left uterine artery. Confirmatory arteriogram was performed. Left uterine artery branches were embolized with 500-700 micron Embospheres to near stasis of flow. A total of 7 vials of Embospheres were utilized for the case. Microcatheter was withdrawn and a followup arteriogram of the right internal iliac artery was performed. The angiographic catheter removed. Additional nitroglycerin and verapamil infused slowly through the radial sheath. The sheath was removed and hemostasis achieved with the TR band. Patient tolerated the procedure well. FLUOROSCOPY TIME:  Radiation Exposure Index (as provided by the fluoroscopic device): 414 mGy air Kerma COMPLICATIONS: COMPLICATIONS None  immediate IMPRESSION: 1. Technically successful bilateral uterine artery embolization using 500-700 micron Embospheres. Electronically Signed   By: Corlis Leak M.D.   On: 01/27/2022 15:19    Labs:  CBC: Recent Labs    07/14/21 1355 10/17/21 1420 01/16/22 1354 01/27/22 1033  WBC 4.8 5.9 4.7 4.6  HGB 14.1 13.5 13.6 14.4  HCT 42.4 40.0 41.6 44.5  PLT 214 262 236 213    COAGS: Recent Labs    01/27/22 1033  INR 1.0    BMP: Recent Labs    03/10/21 0854 03/25/21 1113 01/27/22 1033  NA 137 139 141  K 3.7 4.2 3.7  CL 103 104 104  CO2 26 21 29   GLUCOSE 118* 78 92  BUN 15 12 19   CALCIUM 9.7 9.2 9.4  CREATININE 0.70 0.58 0.80  GFRNONAA >60  --  >60    LIVER FUNCTION TESTS: Recent Labs    03/10/21 0854 03/25/21 1113  BILITOT 0.7 0.6  AST 25 18  ALT 24 15  ALKPHOS 56 58  PROT 7.0 6.6  ALBUMIN 4.1 4.0    TUMOR MARKERS: No results for input(s): "AFPTM", "CEA", "CA199", "CHROMGRNA" in the last 8760 hours.  Assessment and Plan:  My impression is that this patient has done very well post uterine fibroid embolization, with minimal symptomatology.  Her subjective low-grade fever has resolved.  We discussed the   expected postprocedure course including some irregularity of her menstrual cycle over the next few months, normalizing to her new normal by 6 months typically.  She can return to full physical, sexual, and work activity as is comfortable.  She can schedule consultation with her gynecologist regarding   perimenopausal symptomatology as needed.  I would postpone   Pap smear until at least 6 months postprocedure however.   She seemed to understand, and asked appropriate questions which were answered.  She knows to call in the interval should she have any questions or problems.  We will plan to see her back at the 28-month mark with a follow-up pelvic MRI to confirm appropriate devitalization and involution of all uterine fibroids.  Thank you for this interesting consult.   I greatly enjoyed meeting Jamie Barajas and look forward to participating in their care.  A copy of this report was sent to the requesting provider on this date.  Electronically Signed: Durwin Glaze 02/20/2022, 10:59 AM   I spent a total of    25 Minutes in remote  clinical consultation, greater than 50% of which was counseling/coordinating care for uterine fibroids, postembolization.    Visit type: Audio only (telephone). Audio (no video) only due to patient's lack of internet/smartphone capability. Alternative for in-person consultation at [facility], [address] [place], [place], [place]. This visit type was conducted due to national recommendations for restrictions regarding the COVID-19 Pandemic (e.g. social distancing).  This format is felt to be most appropriate for this patient at this time.  All issues noted in this document were discussed and addressed.

## 2022-03-16 ENCOUNTER — Encounter: Payer: Self-pay | Admitting: Obstetrics and Gynecology

## 2022-03-18 ENCOUNTER — Other Ambulatory Visit: Payer: Self-pay | Admitting: Family

## 2022-03-18 DIAGNOSIS — D573 Sickle-cell trait: Secondary | ICD-10-CM

## 2022-03-18 DIAGNOSIS — D649 Anemia, unspecified: Secondary | ICD-10-CM

## 2022-03-25 ENCOUNTER — Encounter (INDEPENDENT_AMBULATORY_CARE_PROVIDER_SITE_OTHER): Payer: Self-pay

## 2022-03-31 NOTE — Progress Notes (Deleted)
51 y.o. G23P2002 Married Black or [place] American Not Hispanic or Latino female here for annual exam.      No LMP recorded.          Sexually active: {yes no:314532}  The current method of family planning is {contraception:315051}.    Exercising: {yes no:314532}  {types:19826} Smoker:  {YES [id number]  Health Maintenance: Pap:  2021 with Dr. Garwin Brothers  History of abnormal Pap:  yes unsure as to what was abnormal. H/O cryosurgery on her cervix a few years ago.  MMG:  03/16/22 Bi-rads 1 neg  BMD:   none  Colonoscopy: none *** TDaP:  02/26/17 Gardasil: n/a   reports that she has never smoked. She has never used smokeless tobacco. She reports that she does not drink alcohol and does not use drugs.  Past Medical History:  Diagnosis Date   Abnormal EKG    Decreased anterior R wave progression, nonspecific ST-T wave changes   Anemia    Anxiety    Atrial tachycardia (HCC)    History of atrial tachycardia prior to 2013.   Bilateral swelling of feet    Chest pain    Nuclear, Dec 29, 2011, normal, EF 70%   Family history of breast cancer 02/09/2021   Fibroid    Fibroid uterus 02/09/2021   Fibroids    History of anemia 02/09/2021   Hypertension    Hyperthyroidism    2013, Patient seen extensively by endocrinology with treatment given. See hospital records   Menorrhagia with irregular cycle 02/09/2021   Prolonged QT interval    QT interval is normal when the T wave is clear   PTSD (post-traumatic stress disorder)    Shortness of breath    Echo, May, 2013, EF 65%, no valvular abnormalities.   Sickle cell trait (HCC)    SVT (supraventricular tachycardia) (HCC)    Uveitis    Vitamin B 12 deficiency    Vitamin D deficiency     Past Surgical History:  Procedure Laterality Date   cryosurgery of cervix     IR ANGIOGRAM PELVIS SELECTIVE OR SUPRASELECTIVE  01/27/2022   IR ANGIOGRAM SELECTIVE EACH ADDITIONAL VESSEL  01/27/2022   IR ANGIOGRAM SELECTIVE EACH ADDITIONAL VESSEL  01/27/2022   IR  EMBO TUMOR ORGAN ISCHEMIA INFARCT INC GUIDE ROADMAPPING  01/27/2022   IR RADIOLOGIST EVAL & MGMT  03/24/2017   IR RADIOLOGIST EVAL & MGMT  03/06/2021   IR RADIOLOGIST EVAL & MGMT  02/03/2022   IR RADIOLOGIST EVAL & MGMT  02/20/2022   IR US GUIDE VASC ACCESS LEFT  01/27/2022   KNEE ARTHROSCOPY W/ INTERNAL FIXATION TIBIAL SPINE FRACTURE Right ~ 2011    Current Outpatient Medications  Medication Sig Dispense Refill   amLODipine (NORVASC) 10 MG tablet Take 10 mg by mouth daily.     docusate sodium (COLACE) 100 MG capsule Take 1 capsule (100 mg total) by mouth 2 (two) times daily. 10 capsule 0   Ferrous Sulfate (IRON PO) Take 1 tablet by mouth daily.     folic acid (FOLVITE) 1 MG tablet TAKE 1 TABLET BY MOUTH EVERY DAY 90 tablet 3   HYDROcodone-acetaminophen (NORCO/VICODIN) 5-325 MG tablet Take 1-2 tablets by mouth every 4 (four) hours as needed for moderate pain. 30 tablet 0   LORazepam (ATIVAN) 0.5 MG tablet Take 0.5 mg by mouth daily as needed. For anxiety.     methimazole (TAPAZOLE) 5 MG tablet Take 5 mg by mouth daily.     metoCLOPramide (REGLAN) 10 MG tablet Take  1 tablet (10 mg total) by mouth every 6 (six) hours as needed for nausea. 20 tablet 0   metoprolol succinate (TOPROL-XL) 25 MG 24 hr tablet Take 25 mg by mouth daily.     scopolamine (TRANSDERM-SCOP) 1 MG/3DAYS Place 1 patch (1.5 mg total) onto the skin every 3 (three) days. 10 patch 12   Vitamin D, Ergocalciferol, (DRISDOL) 1.25 MG (50000 UNIT) CAPS capsule Take 1 capsule (50,000 Units total) by mouth every 7 (seven) days. 4 capsule 0   No current facility-administered medications for this visit.    Family History  Problem Relation Age of Onset   Breast cancer Mother 10   Stroke Mother    Cancer Mother    Hypertension Mother    Sarcoidosis Mother    Stroke Father    Hypertension Father    Hyperlipidemia Father    Breast cancer Sister 53    Review of Systems  Exam:   There were no vitals taken for this visit.  Weight  change: '@WEIGHTCHANGE'$ @ Height:      Ht Readings from Last 3 Encounters:  01/27/22 '5\' 4"'$  (1.626 m)  01/16/22 '5\' 4"'$  (1.626 m)  11/19/21 '5\' 4"'$  (1.626 m)    General appearance: alert, cooperative and appears stated age Head: Normocephalic, without obvious abnormality, atraumatic Neck: no adenopathy, supple, symmetrical, trachea midline and thyroid {CHL AMB PHY EX THYROID NORM [phone]::"normal to inspection and palpation"} Lungs: clear to auscultation bilaterally Cardiovascular: regular rate and rhythm Breasts: {Exam; breast:13139::"normal appearance, no masses or tenderness"} Abdomen: soft, non-tender; non distended,  no masses,  no organomegaly Extremities: extremities normal, atraumatic, no cyanosis or edema Skin: Skin color, texture, turgor normal. No rashes or lesions Lymph nodes: Cervical, supraclavicular, and axillary nodes normal. No abnormal inguinal nodes palpated Neurologic: Grossly normal   Pelvic: External genitalia:  no lesions              Urethra:  normal appearing urethra with no masses, tenderness or lesions              Bartholins and Skenes: normal                 Vagina: normal appearing vagina with normal color and discharge, no lesions              Cervix: {CHL AMB PHY EX CERVIX NORM DEFAULT:[phone]::"no lesions"}               Bimanual Exam:  Uterus:  {CHL AMB PHY EX UTERUS NORM DEFAULT:[phone]::"normal size, contour, position, consistency, mobility, non-tender"}              Adnexa: {CHL AMB PHY EX ADNEXA NO MASS DEFAULT:[phone]::"no mass, fullness, tenderness"}               Rectovaginal: Confirms               Anus:  normal sphincter tone, no lesions  *** chaperoned for the exam.  A:  Well Woman with normal exam  P:

## 2022-04-08 ENCOUNTER — Ambulatory Visit: Payer: BC Managed Care – PPO | Admitting: Obstetrics and Gynecology

## 2022-04-17 ENCOUNTER — Ambulatory Visit: Payer: BC Managed Care – PPO | Admitting: Family

## 2022-04-17 ENCOUNTER — Other Ambulatory Visit: Payer: BC Managed Care – PPO

## 2022-05-11 ENCOUNTER — Inpatient Hospital Stay: Payer: BC Managed Care – PPO

## 2022-05-11 ENCOUNTER — Inpatient Hospital Stay: Payer: BC Managed Care – PPO | Admitting: Family

## 2022-07-21 ENCOUNTER — Other Ambulatory Visit: Payer: Self-pay | Admitting: Interventional Radiology

## 2022-07-21 ENCOUNTER — Encounter: Payer: Self-pay | Admitting: Interventional Radiology

## 2022-07-21 DIAGNOSIS — D250 Submucous leiomyoma of uterus: Secondary | ICD-10-CM

## 2022-08-06 ENCOUNTER — Other Ambulatory Visit: Payer: Self-pay | Admitting: Interventional Radiology

## 2022-08-06 DIAGNOSIS — D250 Submucous leiomyoma of uterus: Secondary | ICD-10-CM

## 2022-08-07 ENCOUNTER — Ambulatory Visit (HOSPITAL_COMMUNITY): Payer: BC Managed Care – PPO

## 2022-08-12 ENCOUNTER — Ambulatory Visit (HOSPITAL_COMMUNITY): Payer: BC Managed Care – PPO

## 2022-08-12 ENCOUNTER — Ambulatory Visit (HOSPITAL_BASED_OUTPATIENT_CLINIC_OR_DEPARTMENT_OTHER)
Admission: RE | Admit: 2022-08-12 | Discharge: 2022-08-12 | Disposition: A | Discharge status: Home / Self Care | Payer: BC Managed Care – PPO | Source: Ambulatory Visit | Attending: Interventional Radiology | Admitting: Interventional Radiology

## 2022-08-12 DIAGNOSIS — D250 Submucous leiomyoma of uterus: Secondary | ICD-10-CM | POA: Insufficient documentation

## 2022-08-12 MED ORDER — GADOBUTROL 1 MMOL/ML IV SOLN
7.5000 mL | Freq: Once | INTRAVENOUS | Status: AC | PRN
  Administered 2022-08-12: 7.5 mL via INTRAVENOUS

## 2022-08-24 ENCOUNTER — Ambulatory Visit
Admission: RE | Admit: 2022-08-24 | Discharge: 2022-08-24 | Disposition: A | Discharge status: Home / Self Care | Payer: BC Managed Care – PPO | Source: Ambulatory Visit | Attending: Interventional Radiology | Admitting: Interventional Radiology

## 2022-08-24 DIAGNOSIS — D250 Submucous leiomyoma of uterus: Secondary | ICD-10-CM

## 2022-08-24 NOTE — Progress Notes (Signed)
Patient ID: Jamie Barajas, female   DOB: Sep 08, 1970, 52 y.o.   MRN: [id number]       Chief Complaint: Patient was seen in consultation today for Uterine fibroids postembolization at the request of Stevan Eberwein  Referring Physician(s): Salvadore Dom   History of Present Illness: Jamie Barajas is a 52 y.o. female who 2016 diagnosed with uterine fibroids 2017 ultrasound demonstrated uterine fibroid 03/24/2017 Initially presented for discussion of treatment options for uterine fibroids.  She ultimately deferred treatment at that time.  11/21/2020 CT  showed enlarged myomatous uterus.  02/22/2021 MR demonstrates enlarged myomatous uterus with fibroids and varying degrees of degeneration, and prominent right pelvic vasculature 03/06/2021 represented having worsening symptoms and reconsidering proceeding with treatment.  She Was accompanied by her husband.   She  had progressive menorrhagia  over the past 2 years.   periods every 30 days lasting 5-7 days with 3-4 days of heavy bleeding including clots, requiring changing maxi pads every 2-3 hours. No interperiod bleeding. No previous fibroid therapies or surgery.  some bulk symptoms primarily abdominal/pelvic bloating, frequent urination and occasional urgency. Only previous gynecologic infections yeast infection. Endometrial biopsy  revealed  only hyperplasia.   G2 P2, with no plans for future pregnancy. 01/27/2022 patient underwent uncomplicated transradial bilateral uterine fibroid embolization.She was discharged home the following day uneventfully.  02/02/2022 patient presented with low-grade fever and malodorous discharge, placed on p.o. course of Augmentin and doxycycline, recovered uneventfully 02/20/2022 seen as scheduled 1 month follow-up postop with near complete resolution of symptoms, back to full activity.  Presents today for scheduled 49-monthfollow-up.  Her MRI looks great with 40% volume decreased overall in her uterus,  marked involution of the top dominant fibroids.  All fibroids appear devitalized with no enhancement.  No evident complications.  She remains asymptomatic post procedure.  She has not had a  menstrual cycle in a few months.   Past Medical History:  Diagnosis Date   Abnormal EKG    Decreased anterior R wave progression, nonspecific ST-T wave changes   Anemia    Anxiety    Atrial tachycardia (HCC)    History of atrial tachycardia prior to 2013.   Bilateral swelling of feet    Chest pain    Nuclear, Dec 29, 2011, normal, EF 70%   Family history of breast cancer 02/09/2021   Fibroid    Fibroid uterus 02/09/2021   Fibroids    History of anemia 02/09/2021   Hypertension    Hyperthyroidism    2013, Patient seen extensively by endocrinology with treatment given. See hospital records   Menorrhagia with irregular cycle 02/09/2021   Prolonged QT interval    QT interval is normal when the T wave is clear   PTSD (post-traumatic stress disorder)    Shortness of breath    Echo, May, 2013, EF 65%, no valvular abnormalities.   Sickle cell trait (HCC)    SVT (supraventricular tachycardia) (HCC)    Uveitis    Vitamin B 12 deficiency    Vitamin D deficiency     Past Surgical History:  Procedure Laterality Date   cryosurgery of cervix     IR ANGIOGRAM PELVIS SELECTIVE OR SUPRASELECTIVE  01/27/2022   IR ANGIOGRAM SELECTIVE EACH ADDITIONAL VESSEL  01/27/2022   IR ANGIOGRAM SELECTIVE EACH ADDITIONAL VESSEL  01/27/2022   IR EMBO TUMOR ORGAN ISCHEMIA INFARCT INC GUIDE ROADMAPPING  01/27/2022   IR RADIOLOGIST EVAL & MGMT  03/24/2017   IR RADIOLOGIST EVAL & MGMT  03/06/2021  IR RADIOLOGIST EVAL & MGMT  02/03/2022   IR RADIOLOGIST EVAL & MGMT  02/20/2022   IR US GUIDE VASC ACCESS LEFT  01/27/2022   KNEE ARTHROSCOPY W/ INTERNAL FIXATION TIBIAL SPINE FRACTURE Right ~ 2011    Allergies: Azithromycin  Medications: Prior to Admission medications   Medication Sig Start Date End Date Taking? Authorizing  Provider  amLODipine (NORVASC) 10 MG tablet Take 10 mg by mouth daily.    [provider]  docusate sodium (COLACE) 100 MG capsule Take 1 capsule (100 mg total) by mouth 2 (two) times daily. 01/28/22   Allred, Darrell K, PA-C  Ferrous Sulfate (IRON PO) Take 1 tablet by mouth daily.    [provider]  folic acid (FOLVITE) 1 MG tablet TAKE 1 TABLET BY MOUTH EVERY DAY 03/18/22   Volanda Napoleon, MD  HYDROcodone-acetaminophen (NORCO/VICODIN) 5-325 MG tablet Take 1-2 tablets by mouth every 4 (four) hours as needed for moderate pain. 01/28/22   Allred, Darrell K, PA-C  LORazepam (ATIVAN) 0.5 MG tablet Take 0.5 mg by mouth daily as needed. For anxiety.    [provider]  methimazole (TAPAZOLE) 5 MG tablet Take 5 mg by mouth daily.    [provider]  metoCLOPramide (REGLAN) 10 MG tablet Take 1 tablet (10 mg total) by mouth every 6 (six) hours as needed for nausea. 01/28/22   Allred, Darrell K, PA-C  metoprolol succinate (TOPROL-XL) 25 MG 24 hr tablet Take 25 mg by mouth daily.    [provider]  scopolamine (TRANSDERM-SCOP) 1 MG/3DAYS Place 1 patch (1.5 mg total) onto the skin every 3 (three) days. 01/30/22   Allred, Darrell K, PA-C  Vitamin D, Ergocalciferol, (DRISDOL) 1.25 MG (50000 UNIT) CAPS capsule Take 1 capsule (50,000 Units total) by mouth every 7 (seven) days. 11/19/21   Briscoe Deutscher, DO     Family History  Problem Relation Age of Onset   Breast cancer Mother 57   Stroke Mother    Cancer Mother    Hypertension Mother    Sarcoidosis Mother    Stroke Father    Hypertension Father    Hyperlipidemia Father    Breast cancer Sister 78    Social History   Socioeconomic History   Marital status: Married    Spouse name: Not on file   Number of children: 2   Years of education: Not on file   Highest education level: Not on file  Occupational History   Occupation: TEACHER    Employer: [employer]  Tobacco Use   Smoking status: Never    Smokeless tobacco: Never  Vaping Use   Vaping Use: Never used  Substance and Sexual Activity   Alcohol use: No    Comment: 12/15/11 "glass of wine maybe 3 times/month"   Drug use: No   Sexual activity: Yes    Birth control/protection: Condom  Other Topics Concern   Not on file  Social History Narrative   Lives with husband in [place], [place].             Social Determinants of Health   Financial Resource Strain: Not on file  Food Insecurity: Not on file  Transportation Needs: Not on file  Physical Activity: Not on file  Stress: Not on file  Social Connections: Not on file    ECOG Status: 0 - Asymptomatic  Review of Systems: A 12 point ROS discussed and pertinent positives are indicated in the HPI above.  All other systems are negative.  Review  of Systems  Vital Signs: BP (!) 180/101 (BP Location: Left Arm)   Pulse 78   SpO2 97%     Physical Exam  Mallampati Score:     Imaging: MR PELVIS W WO CONTRAST  Result Date: 08/12/2022 CLINICAL DATA:  Follow-up uterine fibroids. 6 months status post uterine artery embolization. EXAM: MRI PELVIS WITHOUT AND WITH CONTRAST TECHNIQUE: Multiplanar multisequence MR imaging of the pelvis was performed both before and after administration of intravenous contrast. CONTRAST:  7.63m GADAVIST GADOBUTROL 1 MMOL/ML IV SOLN COMPARISON:  02/22/2021 FINDINGS: Lower Urinary Tract: No urinary bladder or urethral abnormality identified. Bowel: Unremarkable pelvic bowel loops. Vascular/Lymphatic: Unremarkable. No pathologically enlarged pelvic lymph nodes identified. Reproductive: -- Uterus: Measures 12.5 x 9.2 x 10.0 cm (volume = 600 cm^3). This is significantly decreased from volume of 1034 mL on prior exam. Previously seen uterine fibroids have decreased in size since previous study. Largest fibroid in the right uterine corpus measures 8.2 cm in maximum diameter compared to 10.5 cm previously. These fibroids also show absence of contrast  enhancement. No evidence of enlarging uterine masses. -- Right ovary: Appears normal. No ovarian or adnexal masses identified. -- Left ovary: Appears normal. No ovarian or adnexal masses identified. Other: No peritoneal thickening or abnormal free fluid. Musculoskeletal:  Unremarkable. IMPRESSION: Significant decrease in overall uterine volume and size of individual fibroids since previous study. These fibroids also show lack of contrast enhancement. No evidence of adnexal mass or other significant abnormality. Electronically Signed   By: JMarlaine HindM.D.   On: 08/12/2022 12:48    Labs:  CBC: Recent Labs    10/17/21 1420 01/16/22 1354 01/27/22 1033  WBC 5.9 4.7 4.6  HGB 13.5 13.6 14.4  HCT 40.0 41.6 44.5  PLT 262 236 213    COAGS: Recent Labs    01/27/22 1033  INR 1.0    BMP: Recent Labs    01/27/22 1033  NA 141  K 3.7  CL 104  CO2 29  GLUCOSE 92  BUN 19  CALCIUM 9.4  CREATININE 0.80  GFRNONAA >60    LIVER FUNCTION TESTS: No results for input(s): "BILITOT", "AST", "ALT", "ALKPHOS", "PROT", "ALBUMIN" in the last 8760 hours.  TUMOR MARKERS: No results for input(s): "AFPTM", "CEA", "CA199", "CHROMGRNA" in the last 8760 hours.  Assessment and Plan:  My impression is that this patient has done very well post uterine fibroid embolization, with Resolution of presenting     bleeding and bulk symptoms. She may be perimenopausal and she may not return to normal menstrual cycle.  Time will tell.  However she should not have any more heavy uterine bleeding.She can Maintain full physical, sexual, and work activity as is comfortable. She needs continued routine gynecologic care.She seemed to understand, and asked appropriate questions which were answered. She knows to call in the interval should she have any questions or problems.  I will not schedule any further compulsory imaging or visits.  However, I am always happy to take her call or see her back with any questions or  problems that might be related to uterine fibroids with the embolization procedure.  Thank you for this interesting consult.  I greatly enjoyed meeting Jamie Wolbertand look forward to participating in their care.  A copy of this report was sent to the requesting provider on this date.  Electronically Signed: DRickard Rhymes1/03/2023, 4:01 PM   I spent a total of    15 Minutes in face to face in clinical consultation,  greater than 50% of which was counseling/coordinating care for Uterine fibroids, postembolization.

## 2022-12-10 ENCOUNTER — Inpatient Hospital Stay: Payer: BC Managed Care – PPO | Attending: Hematology & Oncology

## 2022-12-10 ENCOUNTER — Encounter: Payer: Self-pay | Admitting: Medical Oncology

## 2022-12-10 ENCOUNTER — Other Ambulatory Visit: Payer: Self-pay

## 2022-12-10 ENCOUNTER — Inpatient Hospital Stay: Payer: BC Managed Care – PPO | Admitting: Medical Oncology

## 2022-12-10 VITALS — BP 136/86 | HR 71 | Temp 98.2°F | Resp 18 | Ht 64.0 in | Wt 179.0 lb

## 2022-12-10 DIAGNOSIS — D259 Leiomyoma of uterus, unspecified: Secondary | ICD-10-CM | POA: Diagnosis not present

## 2022-12-10 DIAGNOSIS — D563 Thalassemia minor: Secondary | ICD-10-CM

## 2022-12-10 DIAGNOSIS — D573 Sickle-cell trait: Secondary | ICD-10-CM | POA: Diagnosis not present

## 2022-12-10 DIAGNOSIS — N920 Excessive and frequent menstruation with regular cycle: Secondary | ICD-10-CM | POA: Insufficient documentation

## 2022-12-10 DIAGNOSIS — R5383 Other fatigue: Secondary | ICD-10-CM | POA: Diagnosis not present

## 2022-12-10 DIAGNOSIS — D500 Iron deficiency anemia secondary to blood loss (chronic): Secondary | ICD-10-CM | POA: Diagnosis not present

## 2022-12-10 DIAGNOSIS — D509 Iron deficiency anemia, unspecified: Secondary | ICD-10-CM

## 2022-12-10 LAB — RETICULOCYTES
Immature Retic Fract: 13.5 % (ref 2.3–15.9)
RBC.: 5.85 MIL/uL — ABNORMAL HIGH (ref 3.87–5.11)
Retic Count, Absolute: 70.2 10*3/uL (ref 19.0–186.0)
Retic Ct Pct: 1.2 % (ref 0.4–3.1)

## 2022-12-10 LAB — CBC WITH DIFFERENTIAL (CANCER CENTER ONLY)
Abs Immature Granulocytes: 0.04 10*3/uL (ref 0.00–0.07)
Basophils Absolute: 0 10*3/uL (ref 0.0–0.1)
Basophils Relative: 1 %
Eosinophils Absolute: 0 10*3/uL (ref 0.0–0.5)
Eosinophils Relative: 1 %
HCT: 41.2 % (ref 36.0–46.0)
Hemoglobin: 13.6 g/dL (ref 12.0–15.0)
Immature Granulocytes: 1 %
Lymphocytes Relative: 26 %
Lymphs Abs: 1.3 10*3/uL (ref 0.7–4.0)
MCH: 23.3 pg — ABNORMAL LOW (ref 26.0–34.0)
MCHC: 33 g/dL (ref 30.0–36.0)
MCV: 70.7 fL — ABNORMAL LOW (ref 80.0–100.0)
Monocytes Absolute: 0.4 10*3/uL (ref 0.1–1.0)
Monocytes Relative: 8 %
Neutro Abs: 3.1 10*3/uL (ref 1.7–7.7)
Neutrophils Relative %: 63 %
Platelet Count: 292 10*3/uL (ref 150–400)
RBC: 5.83 MIL/uL — ABNORMAL HIGH (ref 3.87–5.11)
RDW: 14.5 % (ref 11.5–15.5)
WBC Count: 4.9 10*3/uL (ref 4.0–10.5)
nRBC: 0 % (ref 0.0–0.2)

## 2022-12-10 LAB — IRON AND IRON BINDING CAPACITY (CC-WL,HP ONLY)
Iron: 94 ug/dL (ref 28–170)
Saturation Ratios: 22 % (ref 10.4–31.8)
TIBC: 434 ug/dL (ref 250–450)
UIBC: 340 ug/dL (ref 148–442)

## 2022-12-10 LAB — FERRITIN: Ferritin: 7 ng/mL — ABNORMAL LOW (ref 11–307)

## 2022-12-10 NOTE — Progress Notes (Signed)
Hematology and Oncology Follow Up Visit  Jamie Barajas [id number] July 14, 1971 52 y.o. 12/10/2022   Principle Diagnosis:  Iron deficiency anemia secondary to heavy cycle, uterine fibroids Sickle cell trait Alpha thalassemia minor trait   Current Therapy:        IV iron as indicated Folic acid 1 mg PO daily   Interim History:  Jamie Barajas is here today for follow-up. We see her every 6 months or so.   Although she had a uterine ablation in June of last year she continues to struggle with heavy menstrual cycles  No other blood loss noted. No bruising or petechiae.  She is considering a total hysterectomy  She does have fatigue.  No fever, chills, n/v, cough, rash, dizziness, SOB, chest pain, palpitations, abdominal pain or changes in bowel or bladder habits.  No swelling, tenderness, numbness or tingling in her extremities.  No falls or syncope.  Appetite and hydration are good. Weight is stable at 173 lbs.   ECOG Performance Status: 1 - Symptomatic but completely ambulatory  Medications:  Allergies as of 12/10/2022       Reactions   Azithromycin Itching        Medication List        Accurate as of December 10, 2022  9:33 AM. If you have any questions, ask your nurse or doctor.          amLODipine 10 MG tablet Commonly known as: NORVASC Take 10 mg by mouth daily.   docusate sodium 100 MG capsule Commonly known as: COLACE Take 1 capsule (100 mg total) by mouth 2 (two) times daily.   folic acid 1 MG tablet Commonly known as: FOLVITE TAKE 1 TABLET BY MOUTH EVERY DAY   HYDROcodone-acetaminophen 5-325 MG tablet Commonly known as: NORCO/VICODIN Take 1-2 tablets by mouth every 4 (four) hours as needed for moderate pain.   IRON PO Take 1 tablet by mouth daily.   LORazepam 0.5 MG tablet Commonly known as: ATIVAN Take 0.5 mg by mouth daily as needed. For anxiety.   methimazole 5 MG tablet Commonly known as: TAPAZOLE Take 5 mg by mouth daily.    metoCLOPramide 10 MG tablet Commonly known as: Reglan Take 1 tablet (10 mg total) by mouth every 6 (six) hours as needed for nausea.   metoprolol succinate 25 MG 24 hr tablet Commonly known as: TOPROL-XL Take 25 mg by mouth daily.   scopolamine 1 MG/3DAYS Commonly known as: TRANSDERM-SCOP Place 1 patch (1.5 mg total) onto the skin every 3 (three) days.   Vitamin D (Ergocalciferol) 1.25 MG (50000 UNIT) Caps capsule Commonly known as: DRISDOL Take 1 capsule (50,000 Units total) by mouth every 7 (seven) days.        Allergies:  Allergies  Allergen Reactions   Azithromycin Itching    Past Medical History, Surgical history, Social history, and Family History were reviewed and updated.  Review of Systems: All other 10 point review of systems is negative.   Physical Exam:  height is  (1.626 m) and weight is 179 lb (81.2 kg). Her oral temperature is 98.2 F (36.8 C). Her blood pressure is 136/86 and her pulse is 71. Her respiration is 18 and oxygen saturation is 100%.   Wt Readings from Last 3 Encounters:  12/10/22 179 lb (81.2 kg)  01/27/22 172 lb 13.5 oz (78.4 kg)  01/16/22 173 lb 6.4 oz (78.7 kg)    Ocular: Sclerae unicteric, pupils equal, round and reactive to light Ear-nose-throat: Oropharynx clear, dentition fair  Lymphatic: No cervical or supraclavicular adenopathy Lungs no rales or rhonchi, good excursion bilaterally Heart regular rate and rhythm, no murmur appreciated Abd soft, nontender, positive bowel sounds MSK no focal spinal tenderness, no joint edema Neuro: non-focal, well-oriented, appropriate affect  Lab Results  Component Value Date   WBC 4.9 12/10/2022   HGB 13.6 12/10/2022   HCT 41.2 12/10/2022   MCV 70.7 (L) 12/10/2022   PLT 292 12/10/2022   Lab Results  Component Value Date   FERRITIN 12 01/16/2022   IRON 98 01/16/2022   TIBC 382 01/16/2022   UIBC 284 01/16/2022   IRONPCTSAT 26 01/16/2022   Lab Results  Component Value Date    RETICCTPCT 1.2 12/10/2022   RBC 5.85 (H) 12/10/2022   RBC 5.83 (H) 12/10/2022   No results found for: "KPAFRELGTCHN", "LAMBDASER", "KAPLAMBRATIO" No results found for: "IGGSERUM", "IGA", "IGMSERUM" No results found for: "TOTALPROTELP", "ALBUMINELP", "A1GS", "A2GS", "BETS", "BETA2SER", "GAMS", "MSPIKE", "SPEI"   Chemistry      Component Value Date/Time   NA 141 01/27/2022 1033   NA 139 03/25/2021 1113   K 3.7 01/27/2022 1033   CL 104 01/27/2022 1033   CO2 29 01/27/2022 1033   BUN 19 01/27/2022 1033   BUN 12 03/25/2021 1113   CREATININE 0.80 01/27/2022 1033   CREATININE 0.70 03/10/2021 0854   CREATININE 0.71 02/19/2021 1119      Component Value Date/Time   CALCIUM 9.4 01/27/2022 1033   ALKPHOS 58 03/25/2021 1113   AST 18 03/25/2021 1113   AST 25 03/10/2021 0854   ALT 15 03/25/2021 1113   ALT 24 03/10/2021 0854   BILITOT 0.6 03/25/2021 1113   BILITOT 0.7 03/10/2021 0854       Impression and Plan: Jamie Barajas is a very pleasant 52 yo African American female with history of anemia secondary to heavy cycles, sickle cell trait and alpha thalassemia minor trait.  We discussed her fibroids, thalassemia and her sickle cell trait to give her a better understanding of her chronic conditions. We reviewed her recent labs- high likelihood of needing a IV iron. Orders have been placed and will be scheduled once approved by insurance.  I have encouraged her to discuss the hysterectomy with GYN.  Iron studies are pending.   Disposition: IV iron orders placed- needs to be scheduled RTC 2 months APP, labs (CBC w/, Iron, ferritin, Retic)  Rushie Chestnut, PA-C 4/25/20249:33 AM

## 2022-12-14 ENCOUNTER — Telehealth: Payer: Self-pay | Admitting: *Deleted

## 2022-12-14 NOTE — Telephone Encounter (Signed)
Per scheduling message Sarah - Called patient and lvm for a call back to schedule (2) doses of IV Iron. 

## 2022-12-23 ENCOUNTER — Inpatient Hospital Stay: Payer: BC Managed Care – PPO | Attending: Hematology & Oncology

## 2022-12-23 ENCOUNTER — Encounter: Payer: Self-pay | Admitting: Medical Oncology

## 2022-12-23 VITALS — BP 132/90 | HR 65 | Temp 98.2°F | Resp 18

## 2022-12-23 DIAGNOSIS — D259 Leiomyoma of uterus, unspecified: Secondary | ICD-10-CM | POA: Diagnosis not present

## 2022-12-23 DIAGNOSIS — R5383 Other fatigue: Secondary | ICD-10-CM | POA: Insufficient documentation

## 2022-12-23 DIAGNOSIS — N920 Excessive and frequent menstruation with regular cycle: Secondary | ICD-10-CM | POA: Insufficient documentation

## 2022-12-23 DIAGNOSIS — D509 Iron deficiency anemia, unspecified: Secondary | ICD-10-CM

## 2022-12-23 DIAGNOSIS — D573 Sickle-cell trait: Secondary | ICD-10-CM | POA: Insufficient documentation

## 2022-12-23 DIAGNOSIS — D500 Iron deficiency anemia secondary to blood loss (chronic): Secondary | ICD-10-CM | POA: Insufficient documentation

## 2022-12-23 MED ORDER — SODIUM CHLORIDE 0.9 % IV SOLN
125.0000 mg | Freq: Once | INTRAVENOUS | Status: AC
  Administered 2022-12-23: 125 mg via INTRAVENOUS
  Filled 2022-12-23: qty 125

## 2022-12-23 MED ORDER — SODIUM CHLORIDE 0.9 % IV SOLN: Freq: Once | INTRAVENOUS | Status: AC

## 2022-12-23 NOTE — Patient Instructions (Signed)
Sodium Ferric Gluconate Complex Injection What is this medication? SODIUM FERRIC GLUCONATE COMPLEX (SOE dee um FER ik GLOO koe nate KOM pleks) treats low levels of iron (iron deficiency anemia) in people with kidney disease. Iron is a mineral that plays an important role in making red blood cells, which carry oxygen from your lungs to the rest of your body. This medicine may be used for other purposes; ask your health care provider or pharmacist if you have questions. COMMON BRAND NAME(S): Ferrlecit, Nulecit What should I tell my care team before I take this medication? They need to know if you have any of the following conditions: Anemia that is not from iron deficiency High levels of iron in the blood An unusual or allergic reaction to iron, other medications, foods, dyes, or preservatives Pregnant or are trying to become pregnant Breast-feeding How should I use this medication? This medication is injected into a vein. It is given by your care team in a hospital or clinic setting. Talk to your care team about the use of this medication in children. While it may be prescribed for children as young as 6 years for selected conditions, precautions do apply. Overdosage: If you think you have taken too much of this medicine contact a poison control center or emergency room at once. NOTE: This medicine is only for you. Do not share this medicine with others. What if I miss a dose? It is important not to miss your dose. Call your care team if you are unable to keep an appointment. What may interact with this medication? Do not take this medication with any of the following: Deferasirox Deferoxamine Dimercaprol This medication may also interact with the following: Other iron products This list may not describe all possible interactions. Give your health care provider a list of all the medicines, herbs, non-prescription drugs, or dietary supplements you use. Also tell them if you smoke, drink  alcohol, or use illegal drugs. Some items may interact with your medicine. What should I watch for while using this medication? Your condition will be monitored carefully while you are receiving this medication. Visit your care team for regular checks on your progress. You may need blood work while you are taking this medication. What side effects may I notice from receiving this medication? Side effects that you should report to your care team as soon as possible: Allergic reactions--skin rash, itching, hives, swelling of the face, lips, tongue, or throat Low blood pressure--dizziness, feeling faint or lightheaded, blurry vision Shortness of breath Side effects that usually do not require medical attention (report to your care team if they continue or are bothersome): Flushing Headache Joint pain Muscle pain Nausea Pain, redness, or irritation at injection site This list may not describe all possible side effects. Call your doctor for medical advice about side effects. You may report side effects to FDA at 1-800-FDA-1088. Where should I keep my medication? This medication is given in a hospital or clinic and will not be stored at home. NOTE: This sheet is a summary. It may not cover all possible information. If you have questions about this medicine, talk to your doctor, pharmacist, or health care provider.  2023 Elsevier/Gold Standard (2020-12-27 00:00:00)  

## 2022-12-30 ENCOUNTER — Inpatient Hospital Stay: Payer: BC Managed Care – PPO

## 2023-01-06 ENCOUNTER — Inpatient Hospital Stay: Payer: BC Managed Care – PPO

## 2023-01-06 ENCOUNTER — Encounter: Payer: Self-pay | Admitting: Hematology & Oncology

## 2023-01-06 VITALS — BP 142/88 | HR 71 | Temp 97.9°F | Resp 16

## 2023-01-06 DIAGNOSIS — D500 Iron deficiency anemia secondary to blood loss (chronic): Secondary | ICD-10-CM | POA: Diagnosis not present

## 2023-01-06 DIAGNOSIS — D509 Iron deficiency anemia, unspecified: Secondary | ICD-10-CM

## 2023-01-06 MED ORDER — SODIUM CHLORIDE 0.9 % IV SOLN: Freq: Once | INTRAVENOUS | Status: AC

## 2023-01-06 MED ORDER — SODIUM CHLORIDE 0.9 % IV SOLN
125.0000 mg | Freq: Once | INTRAVENOUS | Status: AC
  Administered 2023-01-06: 125 mg via INTRAVENOUS
  Filled 2023-01-06: qty 10

## 2023-01-06 NOTE — Patient Instructions (Signed)
Sodium Ferric Gluconate Complex Injection What is this medication? SODIUM FERRIC GLUCONATE COMPLEX (SOE dee um FER ik GLOO koe nate KOM pleks) treats low levels of iron (iron deficiency anemia) in people with kidney disease. Iron is a mineral that plays an important role in making red blood cells, which carry oxygen from your lungs to the rest of your body. This medicine may be used for other purposes; ask your health care provider or pharmacist if you have questions. COMMON BRAND NAME(S): Ferrlecit, Nulecit What should I tell my care team before I take this medication? They need to know if you have any of the following conditions: Anemia that is not from iron deficiency High levels of iron in the blood An unusual or allergic reaction to iron, other medications, foods, dyes, or preservatives Pregnant or are trying to become pregnant Breast-feeding How should I use this medication? This medication is injected into a vein. It is given by your care team in a hospital or clinic setting. Talk to your care team about the use of this medication in children. While it may be prescribed for children as young as 6 years for selected conditions, precautions do apply. Overdosage: If you think you have taken too much of this medicine contact a poison control center or emergency room at once. NOTE: This medicine is only for you. Do not share this medicine with others. What if I miss a dose? It is important not to miss your dose. Call your care team if you are unable to keep an appointment. What may interact with this medication? Do not take this medication with any of the following: Deferasirox Deferoxamine Dimercaprol This medication may also interact with the following: Other iron products This list may not describe all possible interactions. Give your health care provider a list of all the medicines, herbs, non-prescription drugs, or dietary supplements you use. Also tell them if you smoke, drink  alcohol, or use illegal drugs. Some items may interact with your medicine. What should I watch for while using this medication? Your condition will be monitored carefully while you are receiving this medication. Visit your care team for regular checks on your progress. You may need blood work while you are taking this medication. What side effects may I notice from receiving this medication? Side effects that you should report to your care team as soon as possible: Allergic reactions--skin rash, itching, hives, swelling of the face, lips, tongue, or throat Low blood pressure--dizziness, feeling faint or lightheaded, blurry vision Shortness of breath Side effects that usually do not require medical attention (report to your care team if they continue or are bothersome): Flushing Headache Joint pain Muscle pain Nausea Pain, redness, or irritation at injection site This list may not describe all possible side effects. Call your doctor for medical advice about side effects. You may report side effects to FDA at 1-800-FDA-1088. Where should I keep my medication? This medication is given in a hospital or clinic and will not be stored at home. NOTE: This sheet is a summary. It may not cover all possible information. If you have questions about this medicine, talk to your doctor, pharmacist, or health care provider.  2023 Elsevier/Gold Standard (2020-12-27 00:00:00)  

## 2023-02-09 ENCOUNTER — Inpatient Hospital Stay: Payer: BC Managed Care – PPO | Admitting: Medical Oncology

## 2023-02-09 ENCOUNTER — Inpatient Hospital Stay: Payer: BC Managed Care – PPO | Attending: Hematology & Oncology

## 2023-02-09 ENCOUNTER — Encounter: Payer: Self-pay | Admitting: Medical Oncology

## 2023-02-09 ENCOUNTER — Other Ambulatory Visit: Payer: Self-pay

## 2023-02-09 VITALS — BP 135/91 | HR 78 | Temp 97.8°F | Resp 17 | Ht 64.0 in | Wt 187.1 lb

## 2023-02-09 DIAGNOSIS — D509 Iron deficiency anemia, unspecified: Secondary | ICD-10-CM

## 2023-02-09 DIAGNOSIS — D500 Iron deficiency anemia secondary to blood loss (chronic): Secondary | ICD-10-CM | POA: Diagnosis present

## 2023-02-09 DIAGNOSIS — D563 Thalassemia minor: Secondary | ICD-10-CM

## 2023-02-09 DIAGNOSIS — N920 Excessive and frequent menstruation with regular cycle: Secondary | ICD-10-CM

## 2023-02-09 DIAGNOSIS — D573 Sickle-cell trait: Secondary | ICD-10-CM

## 2023-02-09 LAB — RETIC PANEL
Immature Retic Fract: 20 % — ABNORMAL HIGH (ref 2.3–15.9)
RBC.: 5.69 MIL/uL — ABNORMAL HIGH (ref 3.87–5.11)
Retic Count, Absolute: 72.8 10*3/uL (ref 19.0–186.0)
Retic Ct Pct: 1.3 % (ref 0.4–3.1)
Reticulocyte Hemoglobin: 25.8 pg — ABNORMAL LOW (ref >27.9)

## 2023-02-09 LAB — CBC WITH DIFFERENTIAL (CANCER CENTER ONLY)
Abs Immature Granulocytes: 0.02 10*3/uL (ref 0.00–0.07)
Basophils Absolute: 0 10*3/uL (ref 0.0–0.1)
Basophils Relative: 0 %
Eosinophils Absolute: 0.1 10*3/uL (ref 0.0–0.5)
Eosinophils Relative: 1 %
HCT: 40.5 % (ref 36.0–46.0)
Hemoglobin: 13.1 g/dL (ref 12.0–15.0)
Immature Granulocytes: 0 %
Lymphocytes Relative: 26 %
Lymphs Abs: 1.3 10*3/uL (ref 0.7–4.0)
MCH: 22.9 pg — ABNORMAL LOW (ref 26.0–34.0)
MCHC: 32.3 g/dL (ref 30.0–36.0)
MCV: 70.9 fL — ABNORMAL LOW (ref 80.0–100.0)
Monocytes Absolute: 0.4 10*3/uL (ref 0.1–1.0)
Monocytes Relative: 8 %
Neutro Abs: 3.3 10*3/uL (ref 1.7–7.7)
Neutrophils Relative %: 65 %
Platelet Count: 277 10*3/uL (ref 150–400)
RBC: 5.71 MIL/uL — ABNORMAL HIGH (ref 3.87–5.11)
RDW: 15.6 % — ABNORMAL HIGH (ref 11.5–15.5)
WBC Count: 5.1 10*3/uL (ref 4.0–10.5)
nRBC: 0 % (ref 0.0–0.2)

## 2023-02-09 MED ORDER — FUSION PLUS PO CAPS: 1.0000 | ORAL_CAPSULE | Freq: Every morning | ORAL | 12 refills | Status: DC

## 2023-02-09 NOTE — Progress Notes (Signed)
Hematology and Oncology Follow Up Visit  Jamie Barajas [id number] 07-15-1971 52 y.o. 02/09/2023   Principle Diagnosis:  Iron deficiency anemia secondary to heavy cycle, uterine fibroids Sickle cell trait Alpha thalassemia minor trait   Current Therapy:        IV iron as indicated Folic acid 1 mg PO daily   Interim History:  Jamie Barajas is here today for follow-up. We last saw her in April 2024. At that time it appeared that she would benefit from IV iron. She was treated with two doses of Ferrlecit (12/23/2022 and 01/06/2023)and returns for follow up today.   Menstrual cycles are irregular and less heavy recently. More like a moderate flow. She has an appointment in 2 days to investigate if she is perimenopausal with GYN.  She reports that she tolerated the Ferrlecit well.  No other blood loss noted. No bruising or petechiae.  She does have fatigue.  No fever, chills, n/v, cough, rash, dizziness, SOB, chest pain, palpitations, abdominal pain or changes in bowel or bladder habits.  No swelling, tenderness, numbness or tingling in her extremities.  No falls or syncope.  Appetite and hydration are good. Weight is stable at 173 lbs.   ECOG Performance Status: 1 - Symptomatic but completely ambulatory  Medications:  Allergies as of 02/09/2023       Reactions   Azithromycin Itching        Medication List        Accurate as of February 09, 2023  9:47 AM. If you have any questions, ask your nurse or doctor.          STOP taking these medications    docusate sodium 100 MG capsule Commonly known as: COLACE Stopped by: Rushie Chestnut, PA-C   HYDROcodone-acetaminophen 5-325 MG tablet Commonly known as: NORCO/VICODIN Stopped by: Rushie Chestnut, PA-C   IRON PO Stopped by: Rushie Chestnut, PA-C   metoCLOPramide 10 MG tablet Commonly known as: Reglan Stopped by: Rushie Chestnut, PA-C   scopolamine 1 MG/3DAYS Commonly known as:  TRANSDERM-SCOP Stopped by: Rushie Chestnut, PA-C       TAKE these medications    amLODipine 10 MG tablet Commonly known as: NORVASC Take 10 mg by mouth daily.   folic acid 1 MG tablet Commonly known as: FOLVITE TAKE 1 TABLET BY MOUTH EVERY DAY   Fusion Plus Caps Take 1 capsule by mouth in the morning. Started by: Rushie Chestnut, PA-C   LORazepam 0.5 MG tablet Commonly known as: ATIVAN Take 0.5 mg by mouth daily as needed. For anxiety.   methimazole 5 MG tablet Commonly known as: TAPAZOLE Take 5 mg by mouth daily.   metoprolol succinate 25 MG 24 hr tablet Commonly known as: TOPROL-XL Take 25 mg by mouth daily.   Vitamin D (Ergocalciferol) 1.25 MG (50000 UNIT) Caps capsule Commonly known as: DRISDOL Take 1 capsule (50,000 Units total) by mouth every 7 (seven) days.        Allergies:  Allergies  Allergen Reactions   Azithromycin Itching    Past Medical History, Surgical history, Social history, and Family History were reviewed and updated.  Review of Systems: All other 10 point review of systems is negative.   Physical Exam:  height is 5\' 4"  (1.626 m) and weight is 187 lb 1.3 oz (84.9 kg). Her oral temperature is 97.8 F (36.6 C). Her blood pressure is 135/91 (abnormal) and her pulse is 78. Her respiration is 17 and oxygen saturation is 100%.  Wt Readings from Last 3 Encounters:  02/09/23 187 lb 1.3 oz (84.9 kg)  12/10/22 179 lb (81.2 kg)  01/27/22 172 lb 13.5 oz (78.4 kg)    Ocular: Sclerae unicteric, pupils equal, round and reactive to light Ear-nose-throat: Oropharynx clear, dentition fair Lymphatic: No cervical or supraclavicular adenopathy Lungs no rales or rhonchi, good excursion bilaterally Heart regular rate and rhythm, no murmur appreciated Abd soft, nontender, positive bowel sounds MSK no focal spinal tenderness, no joint edema Neuro: non-focal, well-oriented, appropriate affect  Lab Results  Component Value Date   WBC 5.1  02/09/2023   HGB 13.1 02/09/2023   HCT 40.5 02/09/2023   MCV 70.9 (L) 02/09/2023   PLT 277 02/09/2023   Lab Results  Component Value Date   FERRITIN 7 (L) 12/10/2022   IRON 94 12/10/2022   TIBC 434 12/10/2022   UIBC 340 12/10/2022   IRONPCTSAT 22 12/10/2022   Lab Results  Component Value Date   RETICCTPCT 1.3 02/09/2023   RBC 5.69 (H) 02/09/2023   No results found for: "KPAFRELGTCHN", "LAMBDASER", "KAPLAMBRATIO" No results found for: "IGGSERUM", "IGA", "IGMSERUM" No results found for: "TOTALPROTELP", "ALBUMINELP", "A1GS", "A2GS", "BETS", "BETA2SER", "GAMS", "MSPIKE", "SPEI"   Chemistry      Component Value Date/Time   NA 141 01/27/2022 1033   NA 139 03/25/2021 1113   K 3.7 01/27/2022 1033   CL 104 01/27/2022 1033   CO2 29 01/27/2022 1033   BUN 19 01/27/2022 1033   BUN 12 03/25/2021 1113   CREATININE 0.80 01/27/2022 1033   CREATININE 0.70 03/10/2021 0854   CREATININE 0.71 02/19/2021 1119      Component Value Date/Time   CALCIUM 9.4 01/27/2022 1033   ALKPHOS 58 03/25/2021 1113   AST 18 03/25/2021 1113   AST 25 03/10/2021 0854   ALT 15 03/25/2021 1113   ALT 24 03/10/2021 0854   BILITOT 0.6 03/25/2021 1113   BILITOT 0.7 03/10/2021 0854       Impression and Plan: Jamie Barajas is a very pleasant 52 yo African American female with history of anemia secondary to heavy cycles, sickle cell trait and alpha thalassemia minor trait.   She is doing well. We reviewed her labs which show a stable and normal Hgb. Has some chronic changes on CBC from her thalassemia. We discussed starting oral iron- she tried this during pregnancy many years ago and reports that she did ok with it. Hopefully this will hold her iron counts so we can space out her IV Iron needs.   Disposition: Fusion Plus to start in between IV iron IV Iron if needed RTC 2 months APP, labs (CBC w/, Iron, ferritin, Retic)-[place]  Rushie Chestnut, PA-C 6/25/20249:47 AM

## 2023-02-10 LAB — IRON AND IRON BINDING CAPACITY (CC-WL,HP ONLY)
Iron: 68 ug/dL (ref 28–170)
Saturation Ratios: 19 % (ref 10.4–31.8)
TIBC: 368 ug/dL (ref 250–450)
UIBC: 300 ug/dL (ref 148–442)

## 2023-02-10 LAB — FERRITIN: Ferritin: 30 ng/mL (ref 11–307)

## 2023-02-12 ENCOUNTER — Other Ambulatory Visit: Payer: Self-pay | Admitting: Medical Oncology

## 2023-03-05 ENCOUNTER — Inpatient Hospital Stay: Payer: BC Managed Care – PPO | Attending: Hematology & Oncology

## 2023-03-12 ENCOUNTER — Inpatient Hospital Stay: Payer: BC Managed Care – PPO

## 2023-04-05 ENCOUNTER — Telehealth: Payer: Self-pay | Admitting: *Deleted

## 2023-04-05 ENCOUNTER — Other Ambulatory Visit: Payer: Self-pay | Admitting: Family

## 2023-04-05 DIAGNOSIS — D509 Iron deficiency anemia, unspecified: Secondary | ICD-10-CM

## 2023-04-05 NOTE — Telephone Encounter (Signed)
Called patient and lvm about moving appointment earlier the same day, requested call back to confirm.

## 2023-04-06 ENCOUNTER — Encounter: Payer: Self-pay | Admitting: Family

## 2023-04-06 ENCOUNTER — Inpatient Hospital Stay: Payer: BC Managed Care – PPO | Admitting: Family

## 2023-04-06 ENCOUNTER — Inpatient Hospital Stay: Payer: BC Managed Care – PPO | Attending: Hematology & Oncology

## 2023-04-06 VITALS — BP 129/81 | HR 77 | Temp 98.2°F | Resp 17 | Wt 182.4 lb

## 2023-04-06 DIAGNOSIS — D573 Sickle-cell trait: Secondary | ICD-10-CM | POA: Insufficient documentation

## 2023-04-06 DIAGNOSIS — D509 Iron deficiency anemia, unspecified: Secondary | ICD-10-CM

## 2023-04-06 DIAGNOSIS — D563 Thalassemia minor: Secondary | ICD-10-CM

## 2023-04-06 DIAGNOSIS — D500 Iron deficiency anemia secondary to blood loss (chronic): Secondary | ICD-10-CM

## 2023-04-06 DIAGNOSIS — N920 Excessive and frequent menstruation with regular cycle: Secondary | ICD-10-CM | POA: Insufficient documentation

## 2023-04-06 LAB — RETICULOCYTES
Immature Retic Fract: 14.3 % (ref 2.3–15.9)
RBC.: 5.86 MIL/uL — ABNORMAL HIGH (ref 3.87–5.11)
Retic Count, Absolute: 72.7 10*3/uL (ref 19.0–186.0)
Retic Ct Pct: 1.2 % (ref 0.4–3.1)

## 2023-04-06 LAB — CBC WITH DIFFERENTIAL (CANCER CENTER ONLY)
Abs Immature Granulocytes: 0.07 10*3/uL (ref 0.00–0.07)
Basophils Absolute: 0 10*3/uL (ref 0.0–0.1)
Basophils Relative: 1 %
Eosinophils Absolute: 0 10*3/uL (ref 0.0–0.5)
Eosinophils Relative: 1 %
HCT: 42.2 % (ref 36.0–46.0)
Hemoglobin: 13.4 g/dL (ref 12.0–15.0)
Immature Granulocytes: 1 %
Lymphocytes Relative: 26 %
Lymphs Abs: 1.5 10*3/uL (ref 0.7–4.0)
MCH: 22.7 pg — ABNORMAL LOW (ref 26.0–34.0)
MCHC: 31.8 g/dL (ref 30.0–36.0)
MCV: 71.5 fL — ABNORMAL LOW (ref 80.0–100.0)
Monocytes Absolute: 0.3 10*3/uL (ref 0.1–1.0)
Monocytes Relative: 6 %
Neutro Abs: 3.7 10*3/uL (ref 1.7–7.7)
Neutrophils Relative %: 65 %
Platelet Count: 283 10*3/uL (ref 150–400)
RBC: 5.9 MIL/uL — ABNORMAL HIGH (ref 3.87–5.11)
RDW: 14.6 % (ref 11.5–15.5)
WBC Count: 5.6 10*3/uL (ref 4.0–10.5)
nRBC: 0 % (ref 0.0–0.2)

## 2023-04-06 LAB — FERRITIN: Ferritin: 24 ng/mL (ref 11–307)

## 2023-04-06 NOTE — Progress Notes (Signed)
Hematology and Oncology Follow Up Visit  Jamie Barajas [id number] [birth year] 52 y.o. 04/06/2023   Principle Diagnosis:  Iron deficiency anemia secondary to heavy cycle, uterine fibroids Sickle cell trait Alpha thalassemia minor trait   Current Therapy:        IV iron as indicated Folic acid 1 mg PO daily   Interim History:  Jamie Barajas is here today for follow-up. She is doing well but has noted a little more bruising on her arms. Not excessive and no petechiae present.  Her cycle is irregular coming once every few months with normal flow.  No other blood loss noted.  No fever, chills, n/v, cough, rash, dizziness, SOB, chest pain, palpitations, abdominal pain or changes in bowel or bladder habits.  No swelling, tenderness, numbness or tingling in her extremities at this time.  No falls or syncope.  Appetite and hydration are good. Weight is stable at 182 lbs.   ECOG Performance Status: 1 - Symptomatic but completely ambulatory  Medications:  Allergies as of 04/06/2023       Reactions   Azithromycin Itching        Medication List        Accurate as of April 06, 2023  2:10 PM. If you have any questions, ask your nurse or doctor.          amLODipine 10 MG tablet Commonly known as: NORVASC Take 10 mg by mouth daily.   folic acid 1 MG tablet Commonly known as: FOLVITE TAKE 1 TABLET BY MOUTH EVERY DAY   Fusion Plus Caps Take 1 capsule by mouth in the morning.   LORazepam 0.5 MG tablet Commonly known as: ATIVAN Take 0.5 mg by mouth daily as needed. For anxiety.   methimazole 5 MG tablet Commonly known as: TAPAZOLE Take 5 mg by mouth daily.   metoprolol succinate 25 MG 24 hr tablet Commonly known as: TOPROL-XL Take 25 mg by mouth daily.   Vitamin D (Ergocalciferol) 1.25 MG (50000 UNIT) Caps capsule Commonly known as: DRISDOL Take 1 capsule (50,000 Units total) by mouth every 7 (seven) days.        Allergies:  Allergies  Allergen  Reactions   Azithromycin Itching    Past Medical History, Surgical history, Social history, and Family History were reviewed and updated.  Review of Systems: All other 10 point review of systems is negative.   Physical Exam:  weight is 182 lb 6.4 oz (82.7 kg). Her oral temperature is 98.2 F (36.8 C). Her blood pressure is 129/81 and her pulse is 77. Her respiration is 17 and oxygen saturation is 100%.   Wt Readings from Last 3 Encounters:  04/06/23 182 lb 6.4 oz (82.7 kg)  02/09/23 187 lb 1.3 oz (84.9 kg)  12/10/22 179 lb (81.2 kg)    Ocular: Sclerae unicteric, pupils equal, round and reactive to light Ear-nose-throat: Oropharynx clear, dentition fair Lymphatic: No cervical or supraclavicular adenopathy Lungs no rales or rhonchi, good excursion bilaterally Heart regular rate and rhythm, no murmur appreciated Abd soft, nontender, positive bowel sounds MSK no focal spinal tenderness, no joint edema Neuro: non-focal, well-oriented, appropriate affect Breasts: Deferred   Lab Results  Component Value Date   WBC 5.6 04/06/2023   HGB 13.4 04/06/2023   HCT 42.2 04/06/2023   MCV 71.5 (L) 04/06/2023   PLT 283 04/06/2023   Lab Results  Component Value Date   FERRITIN 30 02/09/2023   IRON 68 02/09/2023   TIBC 368 02/09/2023   UIBC 300 02/09/2023  IRONPCTSAT 19 02/09/2023   Lab Results  Component Value Date   RETICCTPCT 1.2 04/06/2023   RBC 5.86 (H) 04/06/2023   No results found for: "KPAFRELGTCHN", "LAMBDASER", "KAPLAMBRATIO" No results found for: "IGGSERUM", "IGA", "IGMSERUM" No results found for: "TOTALPROTELP", "ALBUMINELP", "A1GS", "A2GS", "BETS", "BETA2SER", "GAMS", "MSPIKE", "SPEI"   Chemistry      Component Value Date/Time   NA 141 01/27/2022 1033   NA 139 03/25/2021 1113   K 3.7 01/27/2022 1033   CL 104 01/27/2022 1033   CO2 29 01/27/2022 1033   BUN 19 01/27/2022 1033   BUN 12 03/25/2021 1113   CREATININE 0.80 01/27/2022 1033   CREATININE 0.70 03/10/2021  0854   CREATININE 0.71 02/19/2021 1119      Component Value Date/Time   CALCIUM 9.4 01/27/2022 1033   ALKPHOS 58 03/25/2021 1113   AST 18 03/25/2021 1113   AST 25 03/10/2021 0854   ALT 15 03/25/2021 1113   ALT 24 03/10/2021 0854   BILITOT 0.6 03/25/2021 1113   BILITOT 0.7 03/10/2021 0854       Impression and Plan: Jamie Barajas is a very pleasant 52 yo African American female with history of anemia secondary to heavy cycles, sickle cell trait and alpha thalassemia minor trait.   She will continue daily folic acid.  Iron studies are pending. We will replace if needed.  Follow-up in 3 months.   Eileen Stanford, NP 8/20/20242:10 PM

## 2023-04-07 LAB — IRON AND IRON BINDING CAPACITY (CC-WL,HP ONLY)
Iron: 44 ug/dL (ref 28–170)
Saturation Ratios: 12 % (ref 10.4–31.8)
TIBC: 381 ug/dL (ref 250–450)
UIBC: 337 ug/dL (ref 148–442)

## 2023-04-12 ENCOUNTER — Ambulatory Visit: Payer: BC Managed Care – PPO | Admitting: Medical Oncology

## 2023-04-12 ENCOUNTER — Other Ambulatory Visit: Payer: BC Managed Care – PPO

## 2023-06-28 ENCOUNTER — Other Ambulatory Visit: Payer: Self-pay | Admitting: Hematology & Oncology

## 2023-06-28 DIAGNOSIS — D649 Anemia, unspecified: Secondary | ICD-10-CM

## 2023-06-28 DIAGNOSIS — D573 Sickle-cell trait: Secondary | ICD-10-CM

## 2023-07-08 ENCOUNTER — Inpatient Hospital Stay: Payer: BC Managed Care – PPO | Admitting: Medical Oncology

## 2023-07-08 ENCOUNTER — Inpatient Hospital Stay: Payer: BC Managed Care – PPO | Attending: Hematology & Oncology

## 2023-08-16 ENCOUNTER — Encounter: Payer: Self-pay | Admitting: Family

## 2023-09-27 ENCOUNTER — Encounter: Payer: Self-pay | Admitting: Family Medicine

## 2023-09-27 ENCOUNTER — Ambulatory Visit: Payer: 59 | Admitting: Family Medicine

## 2023-09-27 ENCOUNTER — Encounter: Payer: Self-pay | Admitting: Family

## 2023-09-27 VITALS — BP 118/78 | HR 60 | Temp 98.5°F | Ht 64.0 in | Wt 181.1 lb

## 2023-09-27 DIAGNOSIS — D509 Iron deficiency anemia, unspecified: Secondary | ICD-10-CM | POA: Diagnosis not present

## 2023-09-27 DIAGNOSIS — I10 Essential (primary) hypertension: Secondary | ICD-10-CM | POA: Diagnosis not present

## 2023-09-27 DIAGNOSIS — E782 Mixed hyperlipidemia: Secondary | ICD-10-CM

## 2023-09-27 DIAGNOSIS — H04123 Dry eye syndrome of bilateral lacrimal glands: Secondary | ICD-10-CM | POA: Insufficient documentation

## 2023-09-27 DIAGNOSIS — F419 Anxiety disorder, unspecified: Secondary | ICD-10-CM | POA: Diagnosis not present

## 2023-09-27 DIAGNOSIS — R002 Palpitations: Secondary | ICD-10-CM

## 2023-09-27 DIAGNOSIS — M064 Inflammatory polyarthropathy: Secondary | ICD-10-CM | POA: Insufficient documentation

## 2023-09-27 DIAGNOSIS — R7309 Other abnormal glucose: Secondary | ICD-10-CM | POA: Insufficient documentation

## 2023-09-27 DIAGNOSIS — E559 Vitamin D deficiency, unspecified: Secondary | ICD-10-CM

## 2023-09-27 MED ORDER — AMLODIPINE BESYLATE 10 MG PO TABS: 10.0000 mg | ORAL_TABLET | Freq: Every day | ORAL | 3 refills | Status: AC

## 2023-09-27 MED ORDER — LORAZEPAM 0.5 MG PO TABS: 0.5000 mg | ORAL_TABLET | Freq: Every day | ORAL | 0 refills | Status: AC | PRN

## 2023-09-27 MED ORDER — METOPROLOL SUCCINATE ER 25 MG PO TB24: 25.0000 mg | ORAL_TABLET | Freq: Every day | ORAL | 3 refills | Status: DC

## 2023-09-27 NOTE — Progress Notes (Signed)
 Patient Office Visit  Assessment & Plan:  Essential hypertension -     CBC with Differential/Platelet -     COMPLETE METABOLIC PANEL WITH GFR -     amLODIPine  Besylate; Take 1 tablet (10 mg total) by mouth daily.  Dispense: 90 tablet; Refill: 3  Mixed hyperlipidemia -     Lipid panel  Iron deficiency anemia, unspecified iron deficiency anemia type -     CBC with Differential/Platelet  Anxiety -     LORazepam ; Take 1 tablet (0.5 mg total) by mouth daily as needed. For anxiety.  Dispense: 30 tablet; Refill: 0  Palpitations -     CBC with Differential/Platelet -     COMPLETE METABOLIC PANEL WITH GFR -     Metoprolol  Succinate ER; Take 1 tablet (25 mg total) by mouth daily.  Dispense: 90 tablet; Refill: 3  Elevated glucose -     Hemoglobin A1c  Vitamin D  deficiency -     VITAMIN D  25 Hydroxy (Vit-D Deficiency, Fractures)   Test results were reviewed and analyzed as part of the medical decision making of this visit.  Reviewed previous notes from primary care at [facility] and endocrinology during the office visit.  Patient declined influenza vaccine Follow-up on lab work and notify patient.  Recommend healthy diet consistent exercise gradual weight loss.  Return in 4 to 6 months or sooner if necessary. No follow-ups on file.   Subjective:    Patient ID: Jamie Barajas, female    DOB: [birth year]  Age: 53 y.o. MRN: [id number]  Chief Complaint  Patient presents with   Medical Management of Chronic Issues   Establish Care    HPI HTN-using antihypertensive medication without difficulty.  Denies associated signs and symptoms including chest pain, shortness of breath, cough headache, peripheral swelling cramps spasms and palpitations.  Voices understanding of the potential for interference with blood pressure control with substances including high sodium intake, decongestions, herbal supplements weight loss supplements nutritional supplements.  Blood pressures  at home are less than 140/90.   Hyperlipidemia-denies unusual muscle aches or muscle cramps. Not taking chol med.  Aware of need for diet control, exercise and healthy eating.  Patient is aware not to consume grapefruit juice or pomegranate juice.  prediabetes-sugars were borderline last time we checked it at [facility].  Denies diarrhea, peripheral swelling, hypoglycemia, excessive thirst, excessive urination, visual fluctuation, and fatigue.  Making an effort on diet control and exercise.  Has an up-to-date dilated eye exam.  Not Using medication at this time.  Patient's weight remains stable.  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  Patient did join the [employer] [facility] near her house and will start going there. Anxiety-patient does use Ativan  as needed.  Patient was told by the family medicine in [facility] that she needs to see psychiatry.  Patient does not feel that she needs to do this because Ativan  does help and she likes to have it on hand.  Patient does not take it daily. Hyperthyroidism-patient does see Dr. Felipe Horton every 6 months.  Last time her lab work was stable. Vitamin D  deficiency-patient was taking 50,000 IUs once a week but stopped doing this.  Patient would like to have the vitamin D  rechecked. Patient will establish with a new OB/GYN.  Her last one has retired. The 10-year ASCVD risk score (Arnett DK, et al., 2019) is: 5.3%  Past Medical History:  Diagnosis Date   Abnormal EKG  Decreased anterior R wave progression, nonspecific ST-T wave changes   Anemia    Anxiety    Atrial tachycardia (HCC)    History of atrial tachycardia prior to 2013.   Bilateral swelling of feet    Chest pain    Nuclear, Dec 29, 2011, normal, EF 70%   Family history of breast cancer 02/09/2021   Fibroid    Fibroid uterus 02/09/2021   Fibroids    History of anemia 02/09/2021   Hypertension    Hyperthyroidism    2013, Patient seen extensively by endocrinology with  treatment given. See hospital records   Menorrhagia with irregular cycle 02/09/2021   Prolonged QT interval    QT interval is normal when the T wave is clear   PTSD (post-traumatic stress disorder)    Shortness of breath    Echo, May, 2013, EF 65%, no valvular abnormalities.   Sickle cell trait (HCC)    SVT (supraventricular tachycardia) (HCC)    Uveitis    Vitamin B 12 deficiency    Vitamin D  deficiency    Past Surgical History:  Procedure Laterality Date   cryosurgery of cervix     IR ANGIOGRAM PELVIS SELECTIVE OR SUPRASELECTIVE  01/27/2022   IR ANGIOGRAM SELECTIVE EACH ADDITIONAL VESSEL  01/27/2022   IR ANGIOGRAM SELECTIVE EACH ADDITIONAL VESSEL  01/27/2022   IR EMBO TUMOR ORGAN ISCHEMIA INFARCT INC GUIDE ROADMAPPING  01/27/2022   IR RADIOLOGIST EVAL & MGMT  03/24/2017   IR RADIOLOGIST EVAL & MGMT  03/06/2021   IR RADIOLOGIST EVAL & MGMT  02/03/2022   IR RADIOLOGIST EVAL & MGMT  02/20/2022   IR US  GUIDE VASC ACCESS LEFT  01/27/2022   KNEE ARTHROSCOPY W/ INTERNAL FIXATION TIBIAL SPINE FRACTURE Right ~ 2011   Social History   Tobacco Use   Smoking status: Never   Smokeless tobacco: Never  Vaping Use   Vaping status: Never Used  Substance Use Topics   Alcohol  use: No    Comment: 12/15/11 "glass of wine maybe 3 times/month"   Drug use: No   Family History  Problem Relation Age of Onset   Breast cancer Mother 75   Stroke Mother    Cancer Mother    Hypertension Mother    Sarcoidosis Mother    Stroke Father    Hypertension Father    Hyperlipidemia Father    Breast cancer Sister 72   Allergies  Allergen Reactions   Azithromycin Itching    ROS    Objective:    BP 118/78   Pulse 60   Temp 98.5 F (36.9 C)   Ht 5\' 4"  (1.626 m)   Wt 181 lb 2 oz (82.2 kg)   SpO2 99%   BMI 31.09 kg/m  BP Readings from Last 3 Encounters:  09/27/23 118/78  04/06/23 129/81  02/09/23 (!) 135/91   Wt Readings from Last 3 Encounters:  09/27/23 181 lb 2 oz (82.2 kg)  04/06/23 182 lb  6.4 oz (82.7 kg)  02/09/23 187 lb 1.3 oz (84.9 kg)    Physical Exam Vitals and nursing note reviewed.  Constitutional:      General: She is not in acute distress.    Appearance: Normal appearance.  HENT:     Head: Normocephalic.     Right Ear: Tympanic membrane, ear canal and external ear normal.     Left Ear: Tympanic membrane, ear canal and external ear normal.  Eyes:     Extraocular Movements: Extraocular movements intact.     Conjunctiva/sclera:  Conjunctivae normal.     Pupils: Pupils are equal, round, and reactive to light.  Cardiovascular:     Rate and Rhythm: Normal rate and regular rhythm.     Heart sounds: Normal heart sounds.  Pulmonary:     Effort: Pulmonary effort is normal.     Breath sounds: Normal breath sounds.  Musculoskeletal:     Right lower leg: No edema.     Left lower leg: No edema.  Neurological:     General: No focal deficit present.     Mental Status: She is alert and oriented to person, place, and time. Mental status is at baseline.  Psychiatric:        Attention and Perception: Attention normal.        Mood and Affect: Mood and affect normal. Mood is not anxious.        Speech: Speech normal.        Behavior: Behavior normal.        Thought Content: Thought content normal.      No results found for any visits on 09/27/23.

## 2023-09-28 ENCOUNTER — Encounter: Payer: Self-pay | Admitting: Family Medicine

## 2023-09-28 LAB — COMPLETE METABOLIC PANEL WITH GFR
AG Ratio: 1.6 (calc) (ref 1.0–2.5)
ALT: 11 U/L (ref 6–29)
AST: 16 U/L (ref 10–35)
Albumin: 4.5 g/dL (ref 3.6–5.1)
Alkaline phosphatase (APISO): 79 U/L (ref 37–153)
BUN: 14 mg/dL (ref 7–25)
CO2: 26 mmol/L (ref 20–32)
Calcium: 9.4 mg/dL (ref 8.6–10.4)
Chloride: 104 mmol/L (ref 98–110)
Creat: 0.74 mg/dL (ref 0.50–1.03)
Globulin: 2.8 g/dL (ref 1.9–3.7)
Glucose, Bld: 84 mg/dL (ref 65–99)
Potassium: 4.1 mmol/L (ref 3.5–5.3)
Sodium: 139 mmol/L (ref 135–146)
Total Bilirubin: 0.4 mg/dL (ref 0.2–1.2)
Total Protein: 7.3 g/dL (ref 6.1–8.1)
eGFR: 96 mL/min/{1.73_m2} (ref >=60)

## 2023-09-28 LAB — CBC WITH DIFFERENTIAL/PLATELET
Absolute Lymphocytes: 1302 {cells}/uL (ref 850–3900)
Absolute Monocytes: 465 {cells}/uL (ref 200–950)
Basophils Absolute: 28 {cells}/uL (ref 0–200)
Basophils Relative: 0.6 %
Eosinophils Absolute: 19 {cells}/uL (ref 15–500)
Eosinophils Relative: 0.4 %
HCT: 44.5 % (ref 35.0–45.0)
Hemoglobin: 13.7 g/dL (ref 11.7–15.5)
MCH: 22.5 pg — ABNORMAL LOW (ref 27.0–33.0)
MCHC: 30.8 g/dL — ABNORMAL LOW (ref 32.0–36.0)
MCV: 73 fL — ABNORMAL LOW (ref 80.0–100.0)
MPV: 12.1 fL (ref 7.5–12.5)
Monocytes Relative: 9.9 %
Neutro Abs: 2886 {cells}/uL (ref 1500–7800)
Neutrophils Relative %: 61.4 %
Platelets: 297 10*3/uL (ref 140–400)
RBC: 6.1 10*6/uL — ABNORMAL HIGH (ref 3.80–5.10)
RDW: 17.5 % — ABNORMAL HIGH (ref 11.0–15.0)
Total Lymphocyte: 27.7 %
WBC: 4.7 10*3/uL (ref 3.8–10.8)

## 2023-09-28 LAB — HEMOGLOBIN A1C
Hgb A1c MFr Bld: 5.9 %{Hb} — ABNORMAL HIGH (ref <5.7)
Mean Plasma Glucose: 123 mg/dL
eAG (mmol/L): 6.8 mmol/L

## 2023-09-28 LAB — LIPID PANEL
Cholesterol: 153 mg/dL (ref <200)
HDL: 35 mg/dL — ABNORMAL LOW (ref >=50)
LDL Cholesterol (Calc): 96 mg/dL
Non-HDL Cholesterol (Calc): 118 mg/dL (ref <130)
Total CHOL/HDL Ratio: 4.4 (calc) (ref <5.0)
Triglycerides: 122 mg/dL (ref <150)

## 2023-09-28 LAB — VITAMIN D 25 HYDROXY (VIT D DEFICIENCY, FRACTURES): Vit D, 25-Hydroxy: 35 ng/mL (ref 30–100)

## 2023-10-12 ENCOUNTER — Ambulatory Visit: Payer: 59 | Admitting: Cardiology

## 2024-01-12 ENCOUNTER — Encounter: Payer: Self-pay | Admitting: Cardiology

## 2024-01-12 ENCOUNTER — Ambulatory Visit: Payer: 59 | Attending: Cardiology | Admitting: Cardiology

## 2024-01-12 VITALS — BP 134/82 | HR 59 | Ht 64.0 in | Wt 168.2 lb

## 2024-01-12 DIAGNOSIS — R9431 Abnormal electrocardiogram [ECG] [EKG]: Secondary | ICD-10-CM

## 2024-01-12 DIAGNOSIS — Z7689 Persons encountering health services in other specified circumstances: Secondary | ICD-10-CM

## 2024-01-12 DIAGNOSIS — I10 Essential (primary) hypertension: Secondary | ICD-10-CM | POA: Diagnosis not present

## 2024-01-12 MED ORDER — METOPROLOL SUCCINATE ER 25 MG PO TB24: 12.5000 mg | ORAL_TABLET | Freq: Every day | ORAL | 3 refills | Status: AC

## 2024-01-12 NOTE — Patient Instructions (Signed)
 Medication Instructions:   START METOPROLOL  SUCC ER 12.5 MG ONCE DAILY=1/2 OF THE 25 MG TABLET ONCE DAILY  *If you need a refill on your cardiac medications before your next appointment, please call your pharmacy*  Testing/Procedures:  Your cardiac CT will be scheduled at    [facility] [address]  [place], [place] [address] Opening December 13, 2023   If scheduled at the Heart and Vascular Tower at [facility], please enter the parking lot using the [address] street entrance and use the FREE valet service at the patient drop-off area. Enter the buidling and check-in with registration on the main floor.   Please follow these instructions carefully (unless otherwise directed):  An IV will be required for this test and Nitroglycerin  will be given.    On the Night Before the Test: Be sure to Drink plenty of water. Do not consume any caffeinated/decaffeinated beverages or chocolate 12 hours prior to your test. Do not take any antihistamines 12 hours prior to your test.  On the Day of the Test: Drink plenty of water until 1 hour prior to the test. Do not eat any food 1 hour prior to test. You may take your regular medications prior to the test.  FEMALES- please wear underwire-free bra if available, avoid dresses & tight clothing      After the Test: Drink plenty of water. After receiving IV contrast, you may experience a mild flushed feeling. This is normal. On occasion, you may experience a mild rash up to 24 hours after the test. This is not dangerous. If this occurs, you can take Benadryl  25 mg, Zyrtec, Claritin, or Allegra and increase your fluid intake. (Patients taking Tikosyn should avoid Benadryl , and may take Zyrtec, Claritin, or Allegra) If you experience trouble breathing, this can be serious. If it is severe call 911 IMMEDIATELY. If it is mild, please call our office.  We will call to schedule your test 2-4 weeks out understanding that  some insurance companies will need an authorization prior to the service being performed.   For more information and frequently asked questions, please visit our website : [email or web address]  For non-scheduling related questions, please contact the cardiac imaging nurse navigator should you have any questions/concerns: Cardiac Imaging Nurse Navigators Direct Office Dial: [phone]   For scheduling needs, including cancellations and rescheduling, please call [place], [phone].   Follow-Up: At [facility], you and your health needs are our priority.  As part of our continuing mission to provide you with exceptional heart care, our providers are all part of one team.  This team includes your primary Cardiologist (physician) and Advanced Practice Providers or APPs (Physician Assistants and Nurse Practitioners) who all work together to provide you with the care you need, when you need it.  Your next appointment:   16 week(s)  Provider:   DR Jerryl Morin  Other Instructions  CHECK BLOOD PRESSURE ONCE DAILY 2 HOURS AFTER MEDICATIONS AND SEND TO US  IN 1 WEEK
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
## 2024-01-25 ENCOUNTER — Other Ambulatory Visit (HOSPITAL_COMMUNITY)

## 2024-01-28 ENCOUNTER — Encounter: Admitting: Family Medicine

## 2024-02-21 ENCOUNTER — Encounter (HOSPITAL_COMMUNITY): Payer: Self-pay

## 2024-02-21 ENCOUNTER — Telehealth (HOSPITAL_COMMUNITY): Payer: Self-pay | Admitting: *Deleted

## 2024-02-21 NOTE — Telephone Encounter (Signed)
 Attempted to call patient regarding upcoming cardiac CT appointment. Left message on voicemail with name and callback number Johney Frame RN Navigator Cardiac Imaging [facility] [phone] Office

## 2024-02-22 ENCOUNTER — Ambulatory Visit (HOSPITAL_BASED_OUTPATIENT_CLINIC_OR_DEPARTMENT_OTHER): Admission: RE | Admit: 2024-02-22 | Source: Ambulatory Visit

## 2024-03-30 ENCOUNTER — Encounter (HOSPITAL_COMMUNITY): Payer: Self-pay

## 2024-05-02 ENCOUNTER — Ambulatory Visit: Admitting: Cardiology

## 2024-06-23 ENCOUNTER — Other Ambulatory Visit: Payer: Self-pay

## 2024-06-23 ENCOUNTER — Telehealth: Payer: Self-pay

## 2024-06-23 ENCOUNTER — Encounter (HOSPITAL_COMMUNITY): Payer: Self-pay

## 2024-06-23 DIAGNOSIS — R7309 Other abnormal glucose: Secondary | ICD-10-CM

## 2024-06-23 DIAGNOSIS — E559 Vitamin D deficiency, unspecified: Secondary | ICD-10-CM

## 2024-06-23 DIAGNOSIS — E782 Mixed hyperlipidemia: Secondary | ICD-10-CM

## 2024-06-23 NOTE — Telephone Encounter (Signed)
 Copied from CRM #[id number]. Topic: Appointments - Transfer of Care >> Jun 23, 2024  3:26 PM Alfonso ORN wrote: Pt is requesting to transfer FROM: Aletha Bene, MD Pt is requesting to transfer TO: Prentiss Frieze Reason for requested transfer: Pt was seen by Prentiss previously for dietitian purposes It is the responsibility of the team the patient would like to transfer to (Dr. Prentiss) to reach out to the patient if for any reason this transfer is not acceptable.

## 2024-06-26 ENCOUNTER — Telehealth (HOSPITAL_COMMUNITY): Payer: Self-pay | Admitting: Emergency Medicine

## 2024-06-26 NOTE — Telephone Encounter (Signed)
 Attempted to call patient regarding upcoming cardiac CT appointment. Left message on voicemail with name and callback number Rockwell Alexandria RN Navigator Cardiac Imaging [facility] [phone] Office [phone] Cell

## 2024-06-27 ENCOUNTER — Ambulatory Visit (HOSPITAL_BASED_OUTPATIENT_CLINIC_OR_DEPARTMENT_OTHER)

## 2024-07-26 ENCOUNTER — Ambulatory Visit: Admitting: Cardiology

## 2024-08-01 ENCOUNTER — Ambulatory Visit (HOSPITAL_BASED_OUTPATIENT_CLINIC_OR_DEPARTMENT_OTHER)

## 2024-08-09 ENCOUNTER — Other Ambulatory Visit: Payer: Self-pay | Admitting: Hematology & Oncology

## 2024-08-09 DIAGNOSIS — D573 Sickle-cell trait: Secondary | ICD-10-CM

## 2024-08-09 DIAGNOSIS — D649 Anemia, unspecified: Secondary | ICD-10-CM

## 2024-08-11 ENCOUNTER — Encounter (HOSPITAL_COMMUNITY): Payer: Self-pay

## 2024-08-14 ENCOUNTER — Telehealth (HOSPITAL_COMMUNITY): Payer: Self-pay | Admitting: *Deleted

## 2024-08-14 NOTE — Telephone Encounter (Signed)
 Attempted to call patient regarding upcoming cardiac CT appointment. Left message on voicemail with name and callback number  Larey Brick RN Navigator Cardiac Imaging [facility] [phone] Office [phone] Cell

## 2024-08-15 ENCOUNTER — Ambulatory Visit (HOSPITAL_BASED_OUTPATIENT_CLINIC_OR_DEPARTMENT_OTHER)

## 2024-08-24 ENCOUNTER — Encounter: Admitting: Family Medicine

## 2024-09-14 ENCOUNTER — Encounter (HOSPITAL_COMMUNITY): Payer: Self-pay

## 2024-09-29 ENCOUNTER — Encounter: Admitting: Family Medicine

## 2024-10-18 ENCOUNTER — Ambulatory Visit: Admitting: Cardiology
